# Patient Record
Sex: Female | Born: 1970 | Race: Black or African American | Hispanic: No | Marital: Single | Smoking: Never smoker
Health system: Southern US, Community
[De-identification: ages and names within clinical notes are randomized; demographics above are authoritative.]

## PROBLEM LIST (undated history)

## (undated) VITALS — BP 118/77 | HR 91 | Temp 97.9°F | Resp 20 | Ht 67.0 in | Wt 171.0 lb

## (undated) DIAGNOSIS — J302 Other seasonal allergic rhinitis: Secondary | ICD-10-CM

## (undated) DIAGNOSIS — D573 Sickle-cell trait: Secondary | ICD-10-CM

## (undated) DIAGNOSIS — F419 Anxiety disorder, unspecified: Secondary | ICD-10-CM

## (undated) DIAGNOSIS — I10 Essential (primary) hypertension: Secondary | ICD-10-CM

## (undated) DIAGNOSIS — F329 Major depressive disorder, single episode, unspecified: Secondary | ICD-10-CM

## (undated) HISTORY — DX: Sickle-cell trait: D57.3

## (undated) HISTORY — DX: Anxiety disorder, unspecified: F41.9

## (undated) HISTORY — DX: Major depressive disorder, single episode, unspecified: F32.9

## (undated) HISTORY — DX: Other seasonal allergic rhinitis: J30.2

## (undated) HISTORY — DX: Essential (primary) hypertension: I10

---

## 2007-03-22 ENCOUNTER — Inpatient Hospital Stay (HOSPITAL_COMMUNITY): Admission: AD | Admit: 2007-03-22 | Discharge: 2007-03-23 | Payer: Self-pay | Admitting: Obstetrics & Gynecology

## 2010-09-25 LAB — URINE MICROSCOPIC-ADD ON

## 2010-09-25 LAB — BASIC METABOLIC PANEL
BUN: 4 — ABNORMAL LOW
Calcium: 8.9
Creatinine, Ser: 0.51
GFR calc non Af Amer: >60
Glucose, Bld: 107 — ABNORMAL HIGH

## 2010-09-25 LAB — URINALYSIS, ROUTINE W REFLEX MICROSCOPIC
Glucose, UA: NEGATIVE
Ketones, ur: NEGATIVE
Leukocytes, UA: NEGATIVE
Protein, ur: NEGATIVE
Urobilinogen, UA: 0.2

## 2010-09-25 LAB — CBC
HCT: 32.9 — ABNORMAL LOW
Hemoglobin: 11.4 — ABNORMAL LOW
MCHC: 34.5
Platelets: 295
RDW: 14.6

## 2010-09-25 LAB — ETHANOL: Alcohol, Ethyl (B): <5

## 2010-09-25 LAB — RAPID URINE DRUG SCREEN, HOSP PERFORMED
Benzodiazepines: NOT DETECTED
Cocaine: NOT DETECTED
Opiates: NOT DETECTED

## 2012-12-17 ENCOUNTER — Ambulatory Visit: Payer: Self-pay | Admitting: Neurology

## 2012-12-18 ENCOUNTER — Encounter: Payer: Self-pay | Admitting: Neurology

## 2013-01-21 ENCOUNTER — Encounter: Payer: Self-pay | Admitting: Neurology

## 2013-01-21 ENCOUNTER — Ambulatory Visit (INDEPENDENT_AMBULATORY_CARE_PROVIDER_SITE_OTHER): Payer: Medicaid Other | Admitting: Neurology

## 2013-01-21 VITALS — BP 116/90 | HR 80 | Resp 18 | Ht 67.0 in | Wt 258.1 lb

## 2013-01-21 DIAGNOSIS — H02409 Unspecified ptosis of unspecified eyelid: Secondary | ICD-10-CM

## 2013-01-21 DIAGNOSIS — H02403 Unspecified ptosis of bilateral eyelids: Secondary | ICD-10-CM

## 2013-01-21 NOTE — Progress Notes (Signed)
[name] was seen today in neurologic consultation at the request of Dr. Claudean Kinds, her ophthalmologist at [facility].  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  She is unaccompanied in the office today.  The pt reports that she blinks her eyes a lot, especially when trying to open the eyes.  Sometimes she cannot get the eyes open and has to hold the lid open.  Its been going on a few years (2-3).  She sometimes has horizontal diplopia.  Sometimes one eye is closed and the other is open.  It does not seem to be worse at any particular time of day.  No problems with swallowing.  She has no SOB.  She is on abilfy for 6 months but these sx's predated the abilify.  She has no trouble climbing stairs.  She quit driving about 2.5 years ago because of this.  Just to climb the stairs, she has to hold the lids open to see.  No lateralizing weakness/paresthesias.  No voice changes.  From [place] at birth, then [place] and now [place].  Never seen another provider for this except the recent visit to Dr. Claudean Kinds.  Neuroimaging has not previously been performed.  I  PREVIOUS MEDICATIONS: Not applicable  ALLERGIES:  No Known Allergies  CURRENT MEDS   Medication List       This list is accurate as of: 01/21/13  9:54 AM.  Always use your most recent med list.               ARIPiprazole 9.75 MG/1.3ML injection  Commonly known as:  ABILIFY  Inject 9.75 mg into the muscle every 30 (thirty) days.     IRON PO  Take by mouth daily.     lisinopril 10 MG tablet  Commonly known as:  PRINIVIL,ZESTRIL  Take 10 mg by mouth daily.     VITAMIN D PO  Take by mouth daily.     ZOLOFT PO  Take by mouth daily.         PAST MEDICAL HISTORY:   Past Medical History  Diagnosis Date  . Hypertension   . Depression   . Anxiety     PAST SURGICAL HISTORY:   Past Surgical History  Procedure Laterality Date  . Cesarean section  2002, 2009    SOCIAL HISTORY:   History     Social History  . Marital Status: Married    Spouse Name: N/A    Number of Children: N/A  . Years of Education: N/A   Occupational History  . Not on file.   Social History Main Topics  . Smoking status: Never Smoker   . Smokeless tobacco: Not on file  . Alcohol Use: No  . Drug Use: No  . Sexual Activity: Not on file   Other Topics Concern  . Not on file   Social History Narrative  . No narrative on file    FAMILY HISTORY:   Family Status  Relation Status Death Age  . Mother Deceased     breast cancer  . Father Alive     healthy  . Sister Alive     healthy  . Sister Alive     healthy  . Brother Alive     healthy  . Brother Alive     healthy  . Child Alive     3, healthy    ROS:  A complete 10 system review of systems was obtained and was unremarkable apart from what is mentioned  above.  PHYSICAL EXAMINATION:    VITALS:   Filed Vitals:   01/21/13 0845  BP: 116/90  Pulse: 80  Resp: 18  Height: 5\' 7"  (1.702 m)  Weight: 258 lb 2 oz (117.085 kg)    GEN:  Normal appears female in no acute distress.  Appears stated age. HEENT:  Normocephalic, atraumatic. The mucous membranes are moist. The superficial temporal arteries are without ropiness or tenderness. Cardiovascular: Regular rate and rhythm. Lungs: Clear to auscultation bilaterally.  No conversational dyspnea Neck/Heme: There are no carotid bruits noted bilaterally.  NEUROLOGICAL: Orientation:  The patient is alert and oriented x 3.  Fund of knowledge is appropriate.  Recent and remote memory intact.  Attention span and concentration normal.  Repeats and names without difficulty. Cranial nerves: There is good facial symmetry. There is significant ptosis, L more than R, but with both eyes being virtually closed.  Ice test (modified as only put the ice on for 1 min) resulted in eyelids opening.  When manually opened, she does blink rapidly (not forcefully).  The pupils are equal round and reactive to light  bilaterally. Fundoscopic exam is attempted but the disc margins are not well visualized bilaterally. Extraocular muscles are intact but has some trouble maintaining prolonged upgaze and visual fields are full to confrontational testing (but has to hold lids open to participate with this). Speech is fluent and clear. Soft palate rises symmetrically and there is no tongue deviation. Hearing is intact to conversational tone. Tone: Tone is good throughout. Sensation: Sensation is intact to light touch and pinprick throughout (facial, trunk, extremities). Vibration is intact at the bilateral big toe. There is no extinction with double simultaneous stimulation. There is no sensory dermatomal level identified. Coordination:  The patient has no difficulty with RAM's or FNF bilaterally. Motor: Strength is 5/5 in the bilateral upper and lower extremities.  Shoulder shrug is equal and symmetric. There is no pronator drift.  There are no fasciculations noted. DTR's: Deep tendon reflexes are 1/4 at the bilateral biceps, triceps, brachioradialis, patella and achilles.  Plantar responses are downgoing bilaterally. Gait and Station: The patient is able to ambulate without difficulty. The patient is able to heel toe walk without any difficulty. The patient is able to ambulate in a tandem fashion. The patient is able to stand in the Romberg position.   IMPRESSION/PLAN  1. Ptosis.  -Myasthenia Gravis is high on the list of Ddx, given her hx of fluctuating ptosis and positive ice test.  -MG labs will be drawn today.  If negative, she will have MuSK and we can consider EMG testing.  -I asked her to call me in a week to get results of the labs and to see where we will go from there.  -face to face time, 60 min, mod complexity

## 2013-01-21 NOTE — Patient Instructions (Addendum)
1.  We will draw your labs today and I want you to call me in a week or so for the results.

## 2013-01-27 ENCOUNTER — Telehealth: Payer: Self-pay | Admitting: Neurology

## 2013-01-27 NOTE — Telephone Encounter (Signed)
Dr Tat did you call? I don't see her results yet.

## 2013-01-27 NOTE — Telephone Encounter (Signed)
Still awaiting her results

## 2013-01-27 NOTE — Telephone Encounter (Signed)
Patient made aware we are still awaiting results and we will contact her with them when they are received.

## 2013-01-27 NOTE — Telephone Encounter (Signed)
Pt called stating that Dr. Carles Collet requested for her to call and come in for her study results. She wants to know when she can come in.

## 2013-01-29 ENCOUNTER — Telehealth: Payer: Self-pay | Admitting: Neurology

## 2013-01-29 LAB — MYASTHENIA GRAVIS PANEL 2
ACETYLCHOLINE REC MOD AB: 12 %
Acetylcholine Rec Binding: <0.3 nmol/L

## 2013-01-29 NOTE — Telephone Encounter (Signed)
Message copied by Annamaria Helling on Thu Jan 29, 2013  3:35 PM ------      Message from: TAT, Moca S      Created: Thu Jan 29, 2013  1:58 PM       She needs MuSK drawn and rep stim EMG ------

## 2013-02-02 NOTE — Telephone Encounter (Signed)
Tried to call patient with no answer and no way to leave message. Will try again later.

## 2013-02-03 NOTE — Telephone Encounter (Signed)
Called and they stated I have the wrong number. Letter mailed to patient to contact us for results and to update her contact information.

## 2013-02-09 ENCOUNTER — Telehealth: Payer: Self-pay | Admitting: Neurology

## 2013-02-09 NOTE — Telephone Encounter (Signed)
Called patient and made her aware lab work normal. We need to order an [employer] test (MuSK) that she must sign for. She will come by the office some time this week and sign the [place] form. She will ask for me. I will have our front desk call and set her up for an EMG of rep stim.

## 2013-02-09 NOTE — Telephone Encounter (Signed)
Pt called stating that she received a letter where we have tried to contact her regarding lab results. Please call the pt back.

## 2013-02-09 NOTE — Telephone Encounter (Signed)
[name] can you please set up patient for Rep Stim EMG.

## 2013-03-03 NOTE — Telephone Encounter (Signed)
Patient just came by today to sign the [employer] form. Faxed to [facility] and they will contact patient.

## 2013-03-24 ENCOUNTER — Telehealth: Payer: Self-pay | Admitting: Neurology

## 2013-03-24 NOTE — Telephone Encounter (Signed)
Pt called requesting to speak to a nurse regarding her last blood work results.

## 2013-03-24 NOTE — Telephone Encounter (Signed)
Spoke with patient and made her aware I spoke with [facility]ley Noon last week and her lab results are not due to be resulted until 03/31/2013. She is aware I will call her with results once I get them.

## 2013-03-27 ENCOUNTER — Telehealth: Payer: Self-pay | Admitting: Neurology

## 2013-03-27 NOTE — Telephone Encounter (Signed)
Pt needs to talk to someone about blood work please call [phone]

## 2013-03-27 NOTE — Telephone Encounter (Signed)
Spoke with patient - she states [facility] Noon is trying to charge her $250 for the blood test she had drawn. She was upset and wanted me to call them to explain to them that she couldn't pay it. I told her that I could not do that. It does state that [facility] Noon has a financial assistance program to help those on medicaid, I encouraged her to talk with them directly about this.

## 2013-04-02 ENCOUNTER — Telehealth: Payer: Self-pay | Admitting: Neurology

## 2013-04-02 NOTE — Telephone Encounter (Signed)
Please call patient and let her know that her lab was normal.  Please schedule c/s with Dr. Posey Pronto

## 2013-04-02 NOTE — Telephone Encounter (Signed)
Tried to call patient but number is unavailable at this time.

## 2013-04-02 NOTE — Telephone Encounter (Signed)
Just send her a certified letter in the mail then to call us.

## 2013-04-02 NOTE — Telephone Encounter (Signed)
Tried to call again and phone still not working. Send a certified letter to patient to contact our office.

## 2013-04-13 ENCOUNTER — Telehealth: Payer: Self-pay | Admitting: Neurology

## 2013-04-13 DIAGNOSIS — H02409 Unspecified ptosis of unspecified eyelid: Secondary | ICD-10-CM

## 2013-04-13 NOTE — Telephone Encounter (Signed)
Pt called/returning Jade's call. Best number to reach her is [phone]

## 2013-04-13 NOTE — Telephone Encounter (Signed)
Spoke with patient and made her aware that [facility] lab is normal. Next step would be an EMG. Dr Posey Pronto- please review records and let us know how to schedule. Thanks.

## 2013-04-13 NOTE — Telephone Encounter (Signed)
90-min EMG (MG protocol).  Deen Deguia K. Posey Pronto, DO

## 2013-04-24 ENCOUNTER — Encounter: Payer: Self-pay | Admitting: Neurology

## 2013-06-25 ENCOUNTER — Ambulatory Visit (INDEPENDENT_AMBULATORY_CARE_PROVIDER_SITE_OTHER): Payer: Medicaid Other | Admitting: Neurology

## 2013-06-25 DIAGNOSIS — H02409 Unspecified ptosis of unspecified eyelid: Secondary | ICD-10-CM

## 2013-06-25 DIAGNOSIS — H02403 Unspecified ptosis of bilateral eyelids: Secondary | ICD-10-CM

## 2013-06-25 DIAGNOSIS — H532 Diplopia: Secondary | ICD-10-CM

## 2013-06-25 DIAGNOSIS — R531 Weakness: Secondary | ICD-10-CM

## 2013-06-25 NOTE — Procedures (Signed)
[facility]  [address]  [place], [place] [address] Tel: [phone] Fax:  [phone] Test Date:  06/25/2013  Patient: [name] DOB: Jan 11, 1970 Physician: Narda Amber, DO  Sex: Female Height: 5\' 7"  Ref Phys: Alonza Bogus  ID#: [id number]   Technician: Laureen Ochs R. NCS T.   Patient Complaints: This is a 43 year old female presenting for evaluation of horizontal diplopia, ptosis, and blepharospasm.  NCV & EMG Findings: Extensive electrodiagnostic testing of the left upper and lower extremity with repetitive nerve stimulation of the median and facial nerves reveals:  1. Normal median and sural sensory responses.  2. Normal median motor and peroneal motor response recording at the tibialis anterior. There is no motor response when stimulating the peroneal nerve at the ankle and recording at the extensor digitorum brevis, however, the motor response is normal when stimulating at the fibular head and popliteal fossa. Stimulation at the lateral malleolus produced no motor response. These findings are of uncertain clinical significance.  3. Repetitive nerve stimulation of the median nerve recording at abductor pollicis brevis and the facial nerve recording at the nasalis showed no incremental or decremental changes.  4. No active or chronic motor axon loss changes are seen in any of the tested muscles.   Impression: There is no evidence of a neuromuscular junction disorder. If clinically indicated, single fiber EMG can be considered.  Abnormal peroneal motor response recording at the extensor digitorum brevis is of unclear clinical significance. Patient may return for a dedicated study of the lower extremities, if needed.   ___________________________ Narda Amber, DO    Nerve Conduction Studies Anti Sensory Summary Table   Site NR Peak (ms) Norm Peak (ms) P-T Amp (V) Norm P-T Amp  Left Median Anti Sensory (2nd Digit)  32C  Wrist    2.9 <3.4  51.6 >20  Left Sural Anti Sensory (Lat Mall)  31C  Calf    3.6 <4.5 11.6 >5   Motor Summary Table   Site NR Onset (ms) Norm Onset (ms) O-P Amp (mV) Norm O-P Amp Site1 Site2 Delta-0 (ms) Dist (cm) Vel (m/s) Norm Vel (m/s)  Left Median Motor (Abd Poll Brev)  32C  Wrist    2.7 <3.9 11.4 >6 Elbow Wrist 4.1 24.5 60 >50  Elbow    6.8  11.1         Left Peroneal Motor (Ext Dig Brev)  31C  Ankle    13.8 <5.5 0.0 >3 B Fib Ankle 3.6 0.0  >40  B Fib    10.2  6.4  Poplt B Fib 1.3 10.0 77 >40  Poplt    11.5  6.2         Lateral malleolus    13.8  0.0         Left Peroneal TA Motor (Tib Ant)  31C  Fib Head    2.5 <4.0 4.7 >4 Poplit Fib Head 1.7 7.5 44 >40  Poplit    4.2  4.7          EMG   Side Muscle Ins Act Fibs Psw Fasc Number Recrt Dur Dur. Amp Amp. Poly Poly. Comment  Left AntTibialis Nml Nml Nml Nml Nml Nml Nml Nml Nml Nml Nml Nml N/A  Left 1stDorInt Nml Nml Nml Nml Nml Nml Nml Nml Nml Nml Nml Nml N/A  Left FlexPolLong Nml Nml Nml Nml Nml Nml Nml Nml Nml Nml Nml Nml N/A  Left BrachioRad Nml Nml Nml Nml Nml Nml Nml Nml  Nml Nml Nml Nml N/A  Left PronatorTeres Nml Nml Nml Nml Nml Nml Nml Nml Nml Nml Nml Nml N/A  Left Biceps Nml Nml Nml Nml Nml Nml Nml Nml Nml Nml Nml Nml N/A  Left Triceps Nml Nml Nml Nml Nml Nml Nml Nml Nml Nml Nml Nml N/A  Left RectFemoris Nml Nml Nml Nml Nml Nml Nml Nml Nml Nml Nml Nml N/A  Left GluteusMed Nml Nml Nml Nml Nml Nml Nml Nml Nml Nml Nml Nml N/A  Left Gastroc Nml Nml Nml Nml Nml Nml Nml Nml Nml Nml Nml Nml N/A  Left Flex Dig Long Nml Nml Nml Nml Nml Nml Nml Nml Nml Nml Nml Nml N/A   RNS   Trial # Label Amp 1 (mV)  O-P Amp 5 (mV)  O-P Amp % Dif Area 1 (mVms) Area 5 (mVms) Area % Dif Rep Rate Train Length Pause Time (min:sec) Comments  Left Abd Poll Brev  Tr 1 Baseline 9.76 9.73 -0.3 24.86 23.50 -5.5 3.00 10 00:30   Tr 2 Post Exercise 9.86 9.83 -0.3 23.67 22.76 -3.8 3.00 10 01:00   Tr 3 1 Min Post 10.55 10.30 -2.4 26.60 24.74 -7.0 3.00 10 01:00   Tr 4  2 Min Post 10.39 10.11 -2.7 26.41 24.38 -7.7 3.00 10 01:00   Tr 5 3 Min Post 10.11 9.98 -1.3 25.77 24.65 -4.3 3.00 10 00:00   Left Nasalis  Tr 1 Baseline 2.94 2.95 0.3 0.00 0.00 0.0 3.00 10 00:30   Tr 2 Post Exercise 3.50 3.44 -1.5 0.00 0.00 0.0 3.00 10 01:00   Tr 3 1 Min Post 3.46 3.47 0.4 0.00 0.00 0.0 3.00 10 01:00   Tr 4 2 Min Post 0.66 0.71 8.0 0.75 0.78 4.1 3.00 10 01:00   Tr 5 3 Min Post 3.10 3.35 7.9 0.00 0.00 0.0 3.00 10 00:00     Waveforms:

## 2013-06-27 ENCOUNTER — Telehealth: Payer: Self-pay | Admitting: Neurology

## 2013-06-27 NOTE — Telephone Encounter (Signed)
[name], please let pt know that thus far her labs and EMG are normal.  If still having the same problem, I would like a consult with Dr. Posey Pronto, if Dr. Posey Pronto agreeable, especially since we haven't seen pt in office for visit (except testing) since January.

## 2013-06-29 NOTE — Telephone Encounter (Signed)
No problem.    Vanessa K. Posey Pronto, DO

## 2013-06-29 NOTE — Telephone Encounter (Signed)
Unable to contact patient by phone. A letter has been sent to patient.

## 2013-06-29 NOTE — Telephone Encounter (Signed)
Tried to call patient and phone is not working. Will try again later.

## 2013-07-10 ENCOUNTER — Telehealth: Payer: Self-pay | Admitting: Neurology

## 2013-07-10 NOTE — Telephone Encounter (Signed)
Spoke with patient and she is still having same troubles with eyes. She received our letter, due to not being able to reach her by phone. She was to make a new patient appt with Dr Posey Pronto. Please call and schedule a new patient evaluation with Dr Posey Pronto. See previous note.

## 2013-07-10 NOTE — Telephone Encounter (Signed)
Pt requests return call regarding eye problems. CB# [phone] / Sherri S.

## 2013-08-20 ENCOUNTER — Encounter: Payer: Self-pay | Admitting: Neurology

## 2013-08-20 ENCOUNTER — Ambulatory Visit (INDEPENDENT_AMBULATORY_CARE_PROVIDER_SITE_OTHER): Payer: Medicaid Other | Admitting: Neurology

## 2013-08-20 VITALS — BP 134/90 | HR 65 | Ht 67.0 in | Wt 260.1 lb

## 2013-08-20 DIAGNOSIS — H02409 Unspecified ptosis of unspecified eyelid: Secondary | ICD-10-CM

## 2013-08-20 DIAGNOSIS — H02403 Unspecified ptosis of bilateral eyelids: Secondary | ICD-10-CM

## 2013-08-20 LAB — C-REACTIVE PROTEIN: CRP: 0.9 mg/dL — AB (ref <0.60)

## 2013-08-20 LAB — VITAMIN B12: Vitamin B-12: 863 pg/mL (ref 211–911)

## 2013-08-20 LAB — SEDIMENTATION RATE: SED RATE: 47 mm/h — AB (ref 0–22)

## 2013-08-20 LAB — TSH: TSH: 2.43 u[IU]/mL (ref 0.350–4.500)

## 2013-08-20 MED ORDER — PYRIDOSTIGMINE BROMIDE 60 MG PO TABS: 60.0000 mg | ORAL_TABLET | Freq: Three times a day (TID) | ORAL | Status: DC

## 2013-08-20 NOTE — Patient Instructions (Addendum)
1.  Check blood work 2.  Start mestinon 60mg  three times daily.  Please pay attention if this medication helps your eyes open more.   3.  Return to clinic in 8 weeks

## 2013-08-20 NOTE — Progress Notes (Signed)
[facility] Neurology Division Clinic Note - Initial Visit   Date: 08/20/2013  Vanessa [name] MRN: [id number] DOB: Dec 26, 1970   Dear Dr. Arbutus Leas:  Thank you for your kind referral of Vanessa [name] for consultation of [name]. Although her history is well known to you, please allow [place] to reiterate it for the purpose of our medical record. The patient was accompanied to the clinic by self.    History of Present Illness: Vanessa [name] is a 43 y.o. right-handed originally from [place] with history of [name], Vanessa [name].  Starting around 2012, she developed excessive blinking of the eyes, which interfered with her driving so stopped driving.  She reports having intermittent blurry vision, described as "water over the eyes".  She does not have any double vision.  She also complains of droopiness of her eyelids, which alternates eyes.  She reports the sun makes her symptoms worse, but does not have similar symptoms with bright ambient light.  She sometimes cannot open her eyes Vanessa has to manually hold it open.  Fatigue or overexertion does not change her symptoms.  There is no diurnal variation.  When symptoms started, she was living in [place] at the time Vanessa under the care of a psychiatrist for depression.  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    She denies limb weakness, difficulty swallowing/talking, or shortness of breath.  She was initially referred to my colleague, Dr. Arbutus Leas, for evaluation in January 2015.  At that time, she had testing for AChR antibodies Vanessa MUSK antibody testing which was normal.  EMG with repetitive nerve stimulation performed by myself was normal.   No family history of muscle disease.    Out-side paper records, electronic medical record, Vanessa images have been reviewed where available Vanessa summarized as:  MUSK antibody 03/16/2013:   Negative AChR antibody binding, blocking, Vanessa modulating 01/21/2013:  negative  EMG 06/25/2013: There is no evidence of a neuromuscular junction disorder. If clinically indicated, single fiber EMG can be considered.  Abnormal peroneal motor response recording at the extensor digitorum brevis is of unclear clinical significance. Patient may return for a dedicated study of the lower extremities, if needed.    Past Medical History  Diagnosis Date  . [name]   . Depression   . Anxiety     Past Surgical History  Procedure Laterality Date  . Cesarean section  2002, 2009     Medications:  Current Outpatient Prescriptions on File Prior to Visit  Medication Sig Dispense Refill  . ARIPiprazole (ABILIFY) 9.75 MG/1.3ML injection Inject 9.75 mg into the muscle every 30 (thirty) days.      . Cholecalciferol (VITAMIN D PO) Take by mouth daily.      Marland Kitchen lisinopril (PRINIVIL,ZESTRIL) 10 MG tablet Take 10 mg by mouth daily.      . Sertraline HCl (ZOLOFT PO) Take by mouth daily.       No current facility-administered medications on file prior to visit.    Allergies: No Known Allergies  Family History: Family History  Problem Relation Age of Onset  . Breast cancer Mother     Deceased, 80  . [name] Mother   . Arthritis Father   . [name] Father   . Healthy Son   . Asthma Daughter   . Diabetes type II Daughter     Social History: History   Social History  . Marital Status: Married    Spouse  Name: N/A    Number of Children: N/A  . Years of Education: N/A   Occupational History  . Not on file.   Social History Main Topics  . Smoking status: Never Smoker   . Smokeless tobacco: Not on file  . Alcohol Use: No  . Drug Use: No  . Sexual Activity: Not on file   Other Topics Concern  . Not on file   Social History Narrative   She lives at home with 2 sons (31, 10) Vanessa 1 daughter (15).   She is currently not working.  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  She moved from [place] in 2013.    Review of Systems:  CONSTITUTIONAL: No fevers, chills, night sweats, or weight loss.   EYES: No visual changes or eye pain ENT: No hearing changes.  No history of nose bleeds.   RESPIRATORY: No cough, wheezing Vanessa shortness of breath.   CARDIOVASCULAR: Negative for chest pain, Vanessa palpitations.   GI: Negative for abdominal discomfort, blood in stools or black stools.  No recent change in bowel habits.   GU:  No history of incontinence.   MUSCLOSKELETAL: No history of joint pain or swelling.  No myalgias.   SKIN: Negative for lesions, rash, Vanessa itching.   HEMATOLOGY/ONCOLOGY: Negative for prolonged bleeding, bruising easily, Vanessa swollen nodes.  ENDOCRINE: Negative for cold or heat intolerance, polydipsia or goiter.   PSYCH:  ++depression or anxiety symptoms.   NEURO: As Above.   Vital Signs:  BP 134/90  Pulse 65  Ht $R'5\' 7"'UL$  (1.702 m)  Wt 260 lb 2 oz (117.992 kg)  BMI 40.73 kg/m2  SpO2 98%  Neurological Exam: MENTAL STATUS including orientation to time, place, person, recent Vanessa remote memory, attention span Vanessa concentration, language, Vanessa fund of knowledge is normal.  Speech is not dysarthric.  CRANIAL NERVES: II:  No visual field defects.  Unremarkable fundi.   III-IV-VI: Pupils equal round Vanessa reactive to light.  Normal conjugate, extra-ocular eye movements in all directions of gaze.  No nystagmus.  Moderate bilateral [name] with frequent blinking which is distractible.  Ice pack test was mildly positive.  V:  Normal facial sensation.  Jaw jerk is absent.   VII:  Normal facial symmetry Vanessa movements.  Orbicularis oris, orbicularis oculi, Vanessa buccinator is 5/5. No pathologic facial reflexes.  VIII:  Normal hearing Vanessa vestibular function.   IX-X:  Normal palatal movement.   XI:  Normal shoulder shrug Vanessa head rotation.   XII:  Normal tongue strength Vanessa range of motion, no deviation or  fasciculation.  MOTOR:  No atrophy, fasciculations or abnormal movements.  No pronator drift.  Tone is normal.    Right Upper Extremity:    Left Upper Extremity:    Deltoid  5/5   Deltoid  5/5   Biceps  5/5   Biceps  5/5   Triceps  5/5   Triceps  5/5   Wrist extensors  5/5   Wrist extensors  5/5   Wrist flexors  5/5   Wrist flexors  5/5   Finger extensors  5/5   Finger extensors  5/5   Finger flexors  5/5   Finger flexors  5/5   Dorsal interossei  5/5   Dorsal interossei  5/5   Abductor pollicis  5/5   Abductor pollicis  5/5   Tone (Ashworth scale)  0  Tone (Ashworth scale)  0   Right Lower  Extremity:    Left Lower Extremity:    Hip flexors  5/5   Hip flexors  5/5   Hip extensors  5/5   Hip extensors  5/5   Knee flexors  5/5   Knee flexors  5/5   Knee extensors  5/5   Knee extensors  5/5   Dorsiflexors  5/5   Dorsiflexors  5/5   Plantarflexors  5/5   Plantarflexors  5/5   Toe extensors  5/5   Toe extensors  5/5   Toe flexors  5/5   Toe flexors  5/5   Tone (Ashworth scale)  0  Tone (Ashworth scale)  0   MSRs:  Right                                                                 Left brachioradialis 2+  brachioradialis 2+  biceps 2+  biceps 2+  triceps 2+  triceps 2+  patellar 2+  patellar 2+  ankle jerk 2+  ankle jerk 2+  Hoffman no  Hoffman no  plantar response down  plantar response down   SENSORY:  Normal Vanessa symmetric perception of light touch, pinprick, vibration, Vanessa proprioception.  Romberg's sign absent.   COORDINATION/GAIT: Normal finger-to- nose-finger Vanessa heel-to-shin.  Intact rapid alternating movements bilaterally.  Able to rise from a chair without using arms.  Gait narrow based Vanessa stable. Tandem Vanessa stressed gait intact.    IMPRESSION: Ms. Yearwood is a 43 year-old female with history of depression/anxiety presenting for evaluation of bilateral [name].  She had had AChR binding/blocking/modulating Vanessa MUSK antibody testing which is negative.  Repetitive  nerve stimulation was also normal.  Her exam shows bilateral [name], which fluctuates during the exam Vanessa there is frequent blinking of the eyes, which is very atypical for neuromuscular junction disorders.  There is no fatigable weakness on exam Vanessa facial muscles show full strength.  Her [name] Vanessa eye blinking is quite variable on exam Vanessa distractible (?nonphysiological). Given that her icepack test is mildly positive Vanessa despite her negative antibody Vanessa RNS, I think it is prudent to offer a trial of mestinon. If there is no improvement with mestinon, will wean her off it Vanessa may seek opinion at an academic center.   She is very tearful at the lack of diagnosis, despite seeing various providers in the past.    PLAN/RECOMMENDATIONS:  1.  Check TSH, ESR, CRP, vitamin B12 2.  Trial of mestinon $RemoveBef'60mg'NAsLcPFeix$  three times daily.  Risks Vanessa benefits discussed.   3.  Return to clinic in 2 months.   The duration of this appointment visit was 50 minutes of face-to-face time with the patient.  Greater than 50% of this time was spent in counseling, explanation of diagnosis, planning of further management, Vanessa coordination of care.   Thank you for allowing me to participate in patient's care.  If I can answer any additional questions, I would be pleased to do so.    Sincerely,    Nicholos Aloisi K. Posey Pronto, DO

## 2013-09-28 ENCOUNTER — Telehealth: Payer: Self-pay | Admitting: Neurology

## 2013-09-28 NOTE — Telephone Encounter (Signed)
Records release form received from [facility]. Forwarded to HIM at [employer] via [employer] / [facility]

## 2013-10-20 ENCOUNTER — Encounter: Payer: Self-pay | Admitting: Neurology

## 2013-10-20 ENCOUNTER — Ambulatory Visit (INDEPENDENT_AMBULATORY_CARE_PROVIDER_SITE_OTHER): Payer: Medicaid Other | Admitting: Neurology

## 2013-10-20 VITALS — BP 140/86 | HR 69 | Ht 67.0 in | Wt 257.4 lb

## 2013-10-20 DIAGNOSIS — Z79899 Other long term (current) drug therapy: Secondary | ICD-10-CM

## 2013-10-20 DIAGNOSIS — H02403 Unspecified ptosis of bilateral eyelids: Secondary | ICD-10-CM

## 2013-10-20 LAB — C-REACTIVE PROTEIN: CRP: 0.5 mg/dL (ref <0.60)

## 2013-10-20 LAB — CK: Total CK: 80 U/L (ref 7–177)

## 2013-10-20 LAB — SEDIMENTATION RATE: Sed Rate: 44 mm/hr — ABNORMAL HIGH (ref 0–22)

## 2013-10-20 NOTE — Progress Notes (Signed)
Follow-up Visit   Date: 10/20/2013    [name] MRN: [id number] DOB: Dec 02, 1970   Interim History: [name] is a 43 y.o. right-handed originally from [place] with history of hypertension, and depression returning to clinic for follow-up of bilateral ptosis.  The patient was accompanied to the clinic by self.   History of present illness: Starting around 2012, she developed excessive blinking of the eyes, which interfered with her driving so stopped driving. She reports having intermittent blurry vision, described as "water over the eyes". She does not have any double vision. She also complains of droopiness of her eyelids, which alternates eyes. She reports the sun makes her symptoms worse, but does not have similar symptoms with bright ambient light. She sometimes cannot open her eyes and has to manually hold it open. Fatigue or overexertion does not change her symptoms. There is no diurnal variation. When symptoms started, she was living in [place] at the time and under the care of a psychiatrist for depression. 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   She denies limb weakness, difficulty swallowing/talking, or shortness of breath. She was initially referred to my colleague, Dr. Carles Hall, for evaluation in January 2015. At that time, she had testing for AChR antibodies and MUSK antibody testing which was normal. EMG with repetitive nerve stimulation performed by myself was normal.  No family history of muscle disease.  UPDATE 10/20/2013:  She was given a trial of mestinon $RemoveBef'60mg'qgsIVnIasl$  TID at her last visit, but this has made no change in her ptosis.  She did have eye evaluation and has been prescribed corrective lenses.  She also reports being referred to Dr. 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   Medications:  Current Outpatient Prescriptions on File Prior to Visit  Medication Sig Dispense Refill  .  ARIPiprazole (ABILIFY) 9.75 MG/1.3ML injection Inject 9.75 mg into the muscle every 30 (thirty) days.      . Cholecalciferol (VITAMIN D PO) Take by mouth daily.      Marland Kitchen lisinopril (PRINIVIL,ZESTRIL) 10 MG tablet Take 10 mg by mouth daily.      Marland Kitchen pyridostigmine (MESTINON) 60 MG tablet Take 1 tablet (60 mg total) by mouth 3 (three) times daily.  90 tablet  3  . Sertraline HCl (ZOLOFT PO) Take by mouth daily.       No current facility-administered medications on file prior to visit.    Allergies: No Known Allergies   Review of Systems:  CONSTITUTIONAL: No fevers, chills, night sweats, or weight loss.   EYES: No visual changes or eye pain ENT: No hearing changes.  No history of nose bleeds.   RESPIRATORY: No cough, wheezing and shortness of breath.   CARDIOVASCULAR: Negative for chest pain, and palpitations.   GI: Negative for abdominal discomfort, blood in stools or black stools.  No recent change in bowel habits.   GU:  No history of incontinence.   MUSCLOSKELETAL: No history of joint pain or swelling.  No myalgias.   SKIN: Negative for lesions, rash, and itching.   ENDOCRINE: Negative for cold or heat intolerance, polydipsia or goiter.   PSYCH:  + depression or anxiety symptoms.   NEURO: As Above.   Vital Signs:  BP 140/86  Pulse 69  Ht $R'5\' 7"'df$  (1.702 m)  Wt 257 lb 6 oz (116.745 kg)  BMI 40.30 kg/m2  SpO2 97%  Neurological Exam: MENTAL STATUS including orientation to time, place, person, recent  and remote memory, attention span and concentration, language, and fund of knowledge is normal.  Speech is not dysarthric.  CRANIAL NERVES:  No visual field defects.  Pupils equal round and reactive to light.  Normal conjugate, extra-ocular eye movements in all directions of gaze.  Moderate bilateral ptosis(left >right) with frequent blinking which is distractible.  Facial muscles 5/5 including orbicularis oris, orbicularis oculi, and buccinator.  Palate elevates symmetrically.  Tongue is  midline.  MOTOR:  Motor strength is 5/5 in all extremities.  No atrophy, fasciculations or abnormal movements.  No pronator drift.  Tone is normal.    MSRs:  Reflexes are 2+/4 throughout.  SENSORY:  Intact to vibration.  COORDINATION/GAIT:   Gait narrow based and stable.   Data: Component     Latest Ref Rng 08/20/2013  TSH     0.350 - 4.500 uIU/mL 2.430  Sed Rate     0 - 22 mm/hr 47 (H)  CRP     <0.60 mg/dL 0.9 (H)  Vitamin B-12     211 - 911 pg/mL 863   MUSK antibody 03/16/2013: Negative   AChR antibody binding, blocking, and modulating 01/21/2013: negative   EMG 06/25/2013:  There is no evidence of a neuromuscular junction disorder. If clinically indicated, single fiber EMG can be considered.  Abnormal peroneal motor response recording at the extensor digitorum brevis is of unclear clinical significance. Patient may return for a dedicated study of the lower extremities, if needed.   IMPRESSION: [name] is a 43 year-old female with history of depression/anxiety returning for evaluation of bilateral ptosis. She had had AChR binding/blocking/modulating and MUSK antibody testing which is negative. Repetitive nerve stimulation was also normal. Her exam shows bilateral ptosis, which fluctuates during the exam and there is frequent blinking of the eyes, which is very atypical for neuromuscular junction disorders. There is no fatigable weakness on exam and facial muscles show full strength. Her ptosis and eye blinking is quite variable on exam and distractible (?nonphysiological).  I question if there is component of blepharospasm given her negative work-up.   Trial of mestinon did not have any noticeable benefit, so will stop it.   There was mild elevation in inflammatory markers at her last visit, so I will recheck and add a few additional labs for myopathy although my suspicion is low given her normal motor strength.   PLAN/RECOMMENDATIONS:  1.  Stop mestinon 2.  Check ESR,  CRP, ANA, ENA, CK, aldolase 3.  She will be seeing Dr. 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  Patient to call me in 2-3 months or after seeing eye doctor with an update 4.  If no improvement, may need to get opinion at a [facility] 5.  Return to clinic as needed   The duration of this appointment visit was 25 minutes of face-to-face time with the patient.  Greater than 50% of this time was spent in counseling, explanation of diagnosis, planning of further management, and coordination of care.   Thank you for allowing me to participate in patient's care.  If I can answer any additional questions, I would be pleased to do so.    Sincerely,    Stuti Sandin K. Posey Pronto, DO

## 2013-10-20 NOTE — Patient Instructions (Addendum)
1.  Stop mestinon 2.  Check blood work today 3.  Please call me in 2-3 months or after you have seen your eye doctor with an update 4.  If no improvement, may need to get opinion at a university hospital 5.  Return to clinic as needed

## 2013-10-21 LAB — ENA 9 PANEL
Centromere Ab Screen: <1
ENA SM AB SER-ACNC: NEGATIVE
Jo-1 Antibody, IgG: <1
Ribosomal P Protein Ab: <1
SM/RNP: POSITIVE — AB
SSA (RO) (ENA) ANTIBODY, IGG: NEGATIVE
SSB (LA) (ENA) ANTIBODY, IGG: NEGATIVE
Scleroderma (Scl-70) (ENA) Antibody, IgG: <1
ds DNA Ab: <1 IU/mL

## 2013-10-21 LAB — ANA: Anti Nuclear Antibody(ANA): NEGATIVE

## 2013-10-22 LAB — ALDOLASE: Aldolase: 5 U/L (ref <=8.1)

## 2013-10-23 ENCOUNTER — Encounter: Payer: Self-pay | Admitting: *Deleted

## 2013-10-23 ENCOUNTER — Telehealth: Payer: Self-pay | Admitting: *Deleted

## 2013-10-23 NOTE — Telephone Encounter (Signed)
Patient called back and I informed her that her labs were within normal limits.

## 2013-11-02 ENCOUNTER — Telehealth: Payer: Self-pay | Admitting: Neurology

## 2013-11-02 NOTE — Telephone Encounter (Signed)
Pt called wanting to let Dr. Posey Pronto know that she saw Dr. Donalda Ewings on 10/27/13. Pt stated that Dr. Donalda Ewings is going to refer her to another eyer doctor.  C/B [phone]

## 2013-11-03 NOTE — Telephone Encounter (Signed)
FYI

## 2013-11-03 NOTE — Telephone Encounter (Signed)
Noted.  Donika K. Patel, DO   

## 2014-06-14 DIAGNOSIS — G245 Blepharospasm: Secondary | ICD-10-CM | POA: Insufficient documentation

## 2016-04-03 ENCOUNTER — Ambulatory Visit (INDEPENDENT_AMBULATORY_CARE_PROVIDER_SITE_OTHER): Payer: Self-pay | Admitting: *Deleted

## 2016-04-03 DIAGNOSIS — Z3202 Encounter for pregnancy test, result negative: Secondary | ICD-10-CM

## 2016-04-03 DIAGNOSIS — Z3200 Encounter for pregnancy test, result unknown: Secondary | ICD-10-CM

## 2016-04-03 LAB — POCT PREGNANCY, URINE: Preg Test, Ur: NEGATIVE

## 2016-04-03 NOTE — Progress Notes (Signed)
Pt informed of negative urine pregnancy test. She states LMP was in February but she doesn't know the exact day. Pt is not currently having sx of pregnancy. She was advised by me to wait one week and if no period, she should take a home UPT or schedule to have repeated at this office.  Pt voiced understanding.

## 2016-04-08 ENCOUNTER — Ambulatory Visit (HOSPITAL_COMMUNITY)
Admission: EM | Admit: 2016-04-08 | Discharge: 2016-04-08 | Disposition: A | Discharge status: Home / Self Care | Payer: Medicaid Other | Attending: Emergency Medicine | Admitting: Emergency Medicine

## 2016-04-08 ENCOUNTER — Encounter (HOSPITAL_COMMUNITY): Payer: Self-pay | Admitting: Emergency Medicine

## 2016-04-08 DIAGNOSIS — N939 Abnormal uterine and vaginal bleeding, unspecified: Secondary | ICD-10-CM

## 2016-04-08 DIAGNOSIS — Z3202 Encounter for pregnancy test, result negative: Secondary | ICD-10-CM

## 2016-04-08 LAB — POCT PREGNANCY, URINE: PREG TEST UR: NEGATIVE

## 2016-04-08 NOTE — ED Provider Notes (Signed)
CSN: [id number]     Arrival date & time 04/08/16  1201 History   First MD Initiated Contact with Patient 04/08/16 1237     Chief Complaint  Patient presents with  . Vaginal Bleeding   (Consider location/radiation/quality/duration/timing/severity/associated sxs/prior Treatment) 46 year old female presents to clinic for evaluation of irregular menses. She states she had gone 1 month and three weeks without a cycle, then she started her period today. She describes her bleeding as lite, has only used one pad today. She was evaluated at another clinic last week and tested negative for pregnancy. She denies any abdominal pain, abdominal cramping, weakness, dizziness, light headedness, nausea, vomiting, fever, vaginal pain or discharge. She has been experiencing night sweats, hot flashes, and chills. She does not know what age her mother had menopause. She has had three pregnancies, three living children, one vaginal birth, 2 caesarian, all were full term without complications.    The history is provided by the patient.  Vaginal Bleeding  Associated symptoms: no abdominal pain, no back pain, no dizziness, no dyspareunia, no dysuria, no fever, no nausea and no vaginal discharge     Past Medical History:  Diagnosis Date  . Anxiety   . Depression   . Hypertension    Past Surgical History:  Procedure Laterality Date  . CESAREAN SECTION  2002, 2009   Family History  Problem Relation Age of Onset  . Breast cancer Mother     Deceased, 68  . Hypertension Mother   . Arthritis Father   . Hypertension Father   . Healthy Son   . Asthma Daughter   . Diabetes type II Daughter    Social History  Substance Use Topics  . Smoking status: Never Smoker  . Smokeless tobacco: Not on file  . Alcohol use No   OB History    No data available     Review of Systems  Constitutional: Positive for chills. Negative for activity change, appetite change and fever.       Hot flashes  HENT: Negative for sinus  pain and sinus pressure.   Respiratory: Negative for cough, shortness of breath and wheezing.   Cardiovascular: Negative for chest pain, palpitations and leg swelling.  Gastrointestinal: Negative for abdominal pain, constipation, nausea and vomiting.  Genitourinary: Positive for menstrual problem and vaginal bleeding. Negative for difficulty urinating, dyspareunia, dysuria, flank pain, frequency, pelvic pain, vaginal discharge and vaginal pain.  Musculoskeletal: Negative for back pain, myalgias, neck pain and neck stiffness.  Skin: Negative.   Neurological: Negative for dizziness, weakness and light-headedness.  Hematological: Negative for adenopathy. Does not bruise/bleed easily.    Allergies  Patient has no known allergies.  Home Medications   Prior to Admission medications   Not on File   Meds Ordered and Administered this Visit  Medications - No data to display  BP (!) 151/87 (BP Location: Right Arm)   Pulse (!) 58   Temp 97.9 F (36.6 C) (Oral)   Resp 16   LMP 04/08/2016   SpO2 100%  No data found.   Physical Exam  Constitutional: She is oriented to person, place, and time. She appears well-developed and well-nourished. No distress.  HENT:  Head: Normocephalic.  Right Ear: External ear normal.  Left Ear: External ear normal.  Eyes: Conjunctivae are normal. Right eye exhibits no discharge. Left eye exhibits no discharge.  Cardiovascular: Normal rate and regular rhythm.   Pulmonary/Chest: Effort normal and breath sounds normal.  Abdominal: Soft. Bowel sounds are normal.  Neurological: She is alert and oriented to person, place, and time.  Skin: Skin is warm and dry. Capillary refill takes less than 2 seconds. She is not diaphoretic.  Psychiatric: She has a normal mood and affect. Her behavior is normal.  Nursing note and vitals reviewed.   Urgent Care Course     Procedures (including critical care time)  Labs Review Labs Reviewed  POCT PREGNANCY, URINE     Imaging Review No results found.      MDM   1. Abnormal vaginal bleeding    Repeat pregnancy test was negative, she does not currently have any concerning signs or symptoms for significant underlying illness. Most likely diagnosis considered is perimenopause. Recommend following up with a primary care provider for further evaluation and management. Return to clinic if any weakness, dizziness, chest pain, shortness of breath, or significant increase in bleeding.     Barnet Glasgow, NP 04/08/16 1328

## 2016-04-08 NOTE — ED Triage Notes (Signed)
The patient presented to the [facility] with a complaint of abnormal vaginal bleeding.

## 2016-04-08 NOTE — Discharge Instructions (Signed)
Your pregnancy test was negative, your symptoms are consistent with perimenopause. I have attached the contact information for [facility]. Follow up with them for primary care management and for further evaluation of your condition.

## 2016-05-30 ENCOUNTER — Ambulatory Visit (INDEPENDENT_AMBULATORY_CARE_PROVIDER_SITE_OTHER): Payer: Self-pay | Admitting: Obstetrics & Gynecology

## 2016-05-30 ENCOUNTER — Encounter: Payer: Self-pay | Admitting: Obstetrics & Gynecology

## 2016-05-30 VITALS — BP 125/89 | HR 66 | Ht 67.0 in | Wt 179.6 lb

## 2016-05-30 DIAGNOSIS — N926 Irregular menstruation, unspecified: Secondary | ICD-10-CM

## 2016-05-30 NOTE — Progress Notes (Signed)
Pt reports that a few months ago she had no periods for 1.5 months. She had a period last month that lasted a few days. She has not had anymore bleeding since then.

## 2016-05-30 NOTE — Progress Notes (Signed)
   Subjective:    Patient ID: [name], female    DOB: 1970-12-25, 46 y.o.   MRN: [id number]  HPI 46 yo separated AA P3 here for follow up after being seen at the [facility] for her concern of missing a period in February. All of her other periods for years have been monthly.   She does not use contraception, would like a pregancy.   Review of Systems     Objective:   Physical Exam AA F NAD Breathing, conversing, and ambulating normally Flat affect Abd- benign       Assessment & Plan:  Desire for pregnancy- Rec MVI daily to help prevent ONTDs Missed period- reassurance given Check TSH, CBC She was given the phone number for the free pap clinic.

## 2016-05-31 LAB — CBC
HEMATOCRIT: 29.6 % — AB (ref 34.0–46.6)
Hemoglobin: 8.8 g/dL — ABNORMAL LOW (ref 11.1–15.9)
MCH: 22.3 pg — ABNORMAL LOW (ref 26.6–33.0)
MCHC: 29.7 g/dL — AB (ref 31.5–35.7)
MCV: 75 fL — ABNORMAL LOW (ref 79–97)
Platelets: 294 10*3/uL (ref 150–379)
RBC: 3.94 x10E6/uL (ref 3.77–5.28)
RDW: 17.4 % — AB (ref 12.3–15.4)
WBC: 5.2 10*3/uL (ref 3.4–10.8)

## 2016-05-31 LAB — TSH: TSH: 4.42 u[IU]/mL (ref 0.450–4.500)

## 2016-06-05 ENCOUNTER — Telehealth: Payer: Self-pay | Admitting: *Deleted

## 2016-06-05 DIAGNOSIS — N939 Abnormal uterine and vaginal bleeding, unspecified: Secondary | ICD-10-CM

## 2016-06-05 NOTE — Telephone Encounter (Signed)
Attempted to call patient to give results and schedule a gyn ultrasound. No answer and voicemail was full.

## 2016-06-05 NOTE — Telephone Encounter (Signed)
-----   Message from [place], LPN sent at 02/02/2409  8:54 AM EDT -----   ----- Message ----- From: Emily Filbert, MD Sent: 05/31/2016   7:44 AM To: Estill Bamberg A Rash, LPN  Her hbg is pretty low at 8.8. She needs a gyn u/s. Thanks

## 2016-06-13 ENCOUNTER — Encounter: Payer: Self-pay | Admitting: *Deleted

## 2016-06-13 NOTE — Telephone Encounter (Signed)
Ultrasound scheduled for 9 am on 06/27/16. Letter to patient with appointment information.

## 2016-06-27 ENCOUNTER — Ambulatory Visit (HOSPITAL_COMMUNITY)
Admission: RE | Admit: 2016-06-27 | Discharge: 2016-06-27 | Disposition: A | Discharge status: Home / Self Care | Payer: Medicaid Other | Source: Ambulatory Visit | Attending: Obstetrics & Gynecology | Admitting: Obstetrics & Gynecology

## 2016-06-27 DIAGNOSIS — D251 Intramural leiomyoma of uterus: Secondary | ICD-10-CM | POA: Insufficient documentation

## 2016-06-27 DIAGNOSIS — N939 Abnormal uterine and vaginal bleeding, unspecified: Secondary | ICD-10-CM

## 2016-12-27 ENCOUNTER — Encounter (HOSPITAL_COMMUNITY): Payer: Self-pay | Admitting: Emergency Medicine

## 2016-12-27 ENCOUNTER — Emergency Department (HOSPITAL_COMMUNITY)
Admission: EM | Admit: 2016-12-27 | Discharge: 2016-12-28 | Disposition: A | Discharge status: Home / Self Care | Payer: Self-pay | Attending: Emergency Medicine | Admitting: Emergency Medicine

## 2016-12-27 DIAGNOSIS — F22 Delusional disorders: Secondary | ICD-10-CM | POA: Insufficient documentation

## 2016-12-27 DIAGNOSIS — R4689 Other symptoms and signs involving appearance and behavior: Secondary | ICD-10-CM

## 2016-12-27 DIAGNOSIS — Z9119 Patient's noncompliance with other medical treatment and regimen: Secondary | ICD-10-CM | POA: Insufficient documentation

## 2016-12-27 DIAGNOSIS — R456 Violent behavior: Secondary | ICD-10-CM | POA: Insufficient documentation

## 2016-12-27 DIAGNOSIS — F419 Anxiety disorder, unspecified: Secondary | ICD-10-CM | POA: Insufficient documentation

## 2016-12-27 DIAGNOSIS — F3164 Bipolar disorder, current episode mixed, severe, with psychotic features: Secondary | ICD-10-CM

## 2016-12-27 DIAGNOSIS — I10 Essential (primary) hypertension: Secondary | ICD-10-CM | POA: Insufficient documentation

## 2016-12-27 DIAGNOSIS — F329 Major depressive disorder, single episode, unspecified: Secondary | ICD-10-CM | POA: Insufficient documentation

## 2016-12-27 DIAGNOSIS — Z91040 Latex allergy status: Secondary | ICD-10-CM | POA: Insufficient documentation

## 2016-12-27 DIAGNOSIS — Z9114 Patient's other noncompliance with medication regimen: Secondary | ICD-10-CM

## 2016-12-27 DIAGNOSIS — F319 Bipolar disorder, unspecified: Secondary | ICD-10-CM | POA: Insufficient documentation

## 2016-12-27 NOTE — ED Notes (Signed)
Unable to collect labs pt talking to provider
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
## 2016-12-27 NOTE — ED Notes (Signed)
EDP at bedside to speak with patient
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
## 2016-12-27 NOTE — ED Notes (Signed)
Bed: [facility] Expected date:  Expected time:  Means of arrival:  Comments: IVC Female

## 2016-12-28 ENCOUNTER — Other Ambulatory Visit: Payer: Self-pay

## 2016-12-28 ENCOUNTER — Inpatient Hospital Stay
Admission: AD | Admit: 2016-12-28 | Discharge: 2017-01-18 | DRG: 885 | Disposition: A | Discharge status: Home / Self Care | Payer: No Typology Code available for payment source | Attending: Psychiatry | Admitting: Psychiatry

## 2016-12-28 ENCOUNTER — Encounter: Payer: Self-pay | Admitting: *Deleted

## 2016-12-28 DIAGNOSIS — F3164 Bipolar disorder, current episode mixed, severe, with psychotic features: Principal | ICD-10-CM | POA: Diagnosis present

## 2016-12-28 DIAGNOSIS — Z560 Unemployment, unspecified: Secondary | ICD-10-CM | POA: Diagnosis not present

## 2016-12-28 DIAGNOSIS — I10 Essential (primary) hypertension: Secondary | ICD-10-CM | POA: Diagnosis present

## 2016-12-28 DIAGNOSIS — Z803 Family history of malignant neoplasm of breast: Secondary | ICD-10-CM

## 2016-12-28 DIAGNOSIS — F5105 Insomnia due to other mental disorder: Secondary | ICD-10-CM | POA: Diagnosis present

## 2016-12-28 DIAGNOSIS — F312 Bipolar disorder, current episode manic severe with psychotic features: Secondary | ICD-10-CM | POA: Diagnosis present

## 2016-12-28 DIAGNOSIS — Z91040 Latex allergy status: Secondary | ICD-10-CM

## 2016-12-28 DIAGNOSIS — Z9119 Patient's noncompliance with other medical treatment and regimen: Secondary | ICD-10-CM

## 2016-12-28 DIAGNOSIS — F29 Unspecified psychosis not due to a substance or known physiological condition: Secondary | ICD-10-CM | POA: Diagnosis present

## 2016-12-28 DIAGNOSIS — Z8249 Family history of ischemic heart disease and other diseases of the circulatory system: Secondary | ICD-10-CM | POA: Diagnosis not present

## 2016-12-28 LAB — CBC
HCT: 29.5 % — ABNORMAL LOW (ref 36.0–46.0)
Hemoglobin: 8.4 g/dL — ABNORMAL LOW (ref 12.0–15.0)
MCH: 20.1 pg — AB (ref 26.0–34.0)
MCHC: 28.5 g/dL — ABNORMAL LOW (ref 30.0–36.0)
MCV: 70.7 fL — AB (ref 78.0–100.0)
PLATELETS: 387 10*3/uL (ref 150–400)
RBC: 4.17 MIL/uL (ref 3.87–5.11)
RDW: 17.9 % — AB (ref 11.5–15.5)
WBC: 7.5 10*3/uL (ref 4.0–10.5)

## 2016-12-28 LAB — COMPREHENSIVE METABOLIC PANEL
ALT: 10 U/L — AB (ref 14–54)
AST: 26 U/L (ref 15–41)
Albumin: 4.3 g/dL (ref 3.5–5.0)
Alkaline Phosphatase: 46 U/L (ref 38–126)
Anion gap: 9 (ref 5–15)
BILIRUBIN TOTAL: 0.6 mg/dL (ref 0.3–1.2)
BUN: 17 mg/dL (ref 6–20)
CO2: 23 mmol/L (ref 22–32)
CREATININE: 1.18 mg/dL — AB (ref 0.44–1.00)
Calcium: 9.4 mg/dL (ref 8.9–10.3)
Chloride: 106 mmol/L (ref 101–111)
GFR calc Af Amer: >60 mL/min (ref >60)
GFR, EST NON AFRICAN AMERICAN: 55 mL/min — AB (ref >60)
Glucose, Bld: 99 mg/dL (ref 65–99)
Potassium: 3.5 mmol/L (ref 3.5–5.1)
Sodium: 138 mmol/L (ref 135–145)
TOTAL PROTEIN: 7.8 g/dL (ref 6.5–8.1)

## 2016-12-28 LAB — I-STAT BETA HCG BLOOD, ED (MC, WL, AP ONLY)

## 2016-12-28 LAB — RAPID URINE DRUG SCREEN, HOSP PERFORMED
AMPHETAMINES: NOT DETECTED
BARBITURATES: NOT DETECTED
Benzodiazepines: NOT DETECTED
Cocaine: NOT DETECTED
Opiates: NOT DETECTED
Tetrahydrocannabinol: NOT DETECTED

## 2016-12-28 LAB — SALICYLATE LEVEL: Salicylate Lvl: <7 mg/dL (ref 2.8–30.0)

## 2016-12-28 LAB — ACETAMINOPHEN LEVEL: Acetaminophen (Tylenol), Serum: <10 ug/mL — ABNORMAL LOW (ref 10–30)

## 2016-12-28 LAB — ETHANOL

## 2016-12-28 MED ORDER — ACETAMINOPHEN 325 MG PO TABS: 650.0000 mg | ORAL_TABLET | Freq: Four times a day (QID) | ORAL | Status: DC | PRN

## 2016-12-28 MED ORDER — ALUM & MAG HYDROXIDE-SIMETH 200-200-20 MG/5ML PO SUSP
30.0000 mL | Freq: Four times a day (QID) | ORAL | Status: DC | PRN
Start: 2016-12-28 — End: 2016-12-28

## 2016-12-28 MED ORDER — ONDANSETRON HCL 4 MG PO TABS
4.0000 mg | ORAL_TABLET | Freq: Three times a day (TID) | ORAL | Status: DC | PRN
  Filled 2016-12-28: qty 1

## 2016-12-28 MED ORDER — RISPERIDONE 1 MG PO TABS: 1.0000 mg | ORAL_TABLET | Freq: Two times a day (BID) | ORAL | Status: DC

## 2016-12-28 MED ORDER — ALUM & MAG HYDROXIDE-SIMETH 200-200-20 MG/5ML PO SUSP: 30.0000 mL | Freq: Four times a day (QID) | ORAL | Status: DC | PRN

## 2016-12-28 MED ORDER — RISPERIDONE 1 MG PO TABS
1.0000 mg | ORAL_TABLET | Freq: Two times a day (BID) | ORAL | Status: DC
  Administered 2016-12-28: 1 mg via ORAL
  Filled 2016-12-28: qty 1

## 2016-12-28 MED ORDER — OLANZAPINE 5 MG PO TBDP
15.0000 mg | ORAL_TABLET | Freq: Every day | ORAL | Status: DC
  Administered 2016-12-28 – 2017-01-02 (×6): 15 mg via ORAL
  Filled 2016-12-28 (×6): qty 3

## 2016-12-28 MED ORDER — CARBAMAZEPINE ER 200 MG PO TB12
200.0000 mg | ORAL_TABLET | Freq: Two times a day (BID) | ORAL | Status: DC
  Administered 2016-12-28: 200 mg via ORAL
  Filled 2016-12-28: qty 1

## 2016-12-28 MED ORDER — ACETAMINOPHEN 325 MG PO TABS: 650.0000 mg | ORAL_TABLET | ORAL | Status: DC | PRN

## 2016-12-28 MED ORDER — TRAZODONE HCL 100 MG PO TABS
100.0000 mg | ORAL_TABLET | Freq: Every day | ORAL | Status: DC
  Administered 2016-12-28: 100 mg via ORAL
  Filled 2016-12-28 (×3): qty 1

## 2016-12-28 MED ORDER — DOCUSATE SODIUM 100 MG PO CAPS
100.0000 mg | ORAL_CAPSULE | Freq: Every day | ORAL | Status: DC
  Administered 2016-12-29 – 2017-01-18 (×21): 100 mg via ORAL
  Filled 2016-12-28 (×21): qty 1

## 2016-12-28 MED ORDER — DOCUSATE SODIUM 100 MG PO CAPS
100.0000 mg | ORAL_CAPSULE | Freq: Every day | ORAL | Status: DC
  Administered 2016-12-28: 100 mg via ORAL
  Filled 2016-12-28: qty 1

## 2016-12-28 MED ORDER — MAGNESIUM HYDROXIDE 400 MG/5ML PO SUSP: 30.0000 mL | Freq: Every day | ORAL | Status: DC | PRN

## 2016-12-28 MED ORDER — CARBAMAZEPINE ER 100 MG PO TB12
200.0000 mg | ORAL_TABLET | Freq: Two times a day (BID) | ORAL | Status: DC
  Filled 2016-12-28: qty 2

## 2016-12-28 MED ORDER — FERROUS SULFATE 325 (65 FE) MG PO TABS
325.0000 mg | ORAL_TABLET | Freq: Every day | ORAL | Status: DC
  Administered 2016-12-28: 325 mg via ORAL
  Filled 2016-12-28: qty 1

## 2016-12-28 MED ORDER — HYDROXYZINE HCL 25 MG PO TABS
25.0000 mg | ORAL_TABLET | Freq: Three times a day (TID) | ORAL | Status: DC | PRN
  Filled 2016-12-28 (×2): qty 1

## 2016-12-28 MED ORDER — ONDANSETRON HCL 4 MG PO TABS: 4.0000 mg | ORAL_TABLET | Freq: Three times a day (TID) | ORAL | Status: DC | PRN

## 2016-12-28 MED ORDER — FERROUS SULFATE 325 (65 FE) MG PO TABS
325.0000 mg | ORAL_TABLET | Freq: Every day | ORAL | Status: DC
  Administered 2016-12-29 – 2017-01-18 (×21): 325 mg via ORAL
  Filled 2016-12-28 (×21): qty 1

## 2016-12-28 MED ORDER — DIVALPROEX SODIUM 500 MG PO DR TAB
500.0000 mg | DELAYED_RELEASE_TABLET | Freq: Three times a day (TID) | ORAL | Status: DC
  Administered 2016-12-28 – 2017-01-03 (×17): 500 mg via ORAL
  Filled 2016-12-28 (×17): qty 1

## 2016-12-28 MED ORDER — ALUM & MAG HYDROXIDE-SIMETH 200-200-20 MG/5ML PO SUSP: 30.0000 mL | ORAL | Status: DC | PRN

## 2016-12-28 NOTE — ED Notes (Addendum)
Patient changing into paper scrubs. Patient used restroom unaware of need for urine sample. Will attempt to provide a sample later.
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
## 2016-12-28 NOTE — Progress Notes (Signed)
D:Pt denies SI/HI/AVH. Pt. Able to remain safe while on the unit. Pt verbally contracts for safety. Pt is calm, guarded, and cooperative during assessment, but minimal in answering. Pt. has no Complaints.   A: Q x 15 minute observation checks were completed for safety. Patient was provided with education. Pt verbalizes understanding of provided education. Patient was given scheduled medications. Patient  was encourage to attend groups, participate in unit activities and continue with plan of care.   R:Patient is complaint with medication and unit procedures.             Patient slept for Estimated Hours of 8; Precautionary checks every 15 minutes for safety maintained, room free of safety hazards, patient sustains no injury or falls during this shift.

## 2016-12-28 NOTE — ED Notes (Signed)
Pt to conference room for TTS

## 2016-12-28 NOTE — Plan of Care (Signed)
Pt. Able to remain safe while on the unit. Pt verbally contracts for safety and denies SI/HI. Pt verbalizes understanding of provided education.
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
## 2016-12-28 NOTE — Progress Notes (Signed)
Referral information sent to [facility], [place], [place], [place]. Winferd Humphrey, MSW, LCSW Clinical Social Worker 12/28/2016 3:35 AM

## 2016-12-28 NOTE — ED Notes (Signed)
Patient decline to talk to this writer at the time of assessment. Encouragement and support provided and safety maintain. Q 15 min safety checks in place and video monitoring.

## 2016-12-28 NOTE — ED Notes (Signed)
Patient back from TTS

## 2016-12-28 NOTE — Tx Team (Signed)
Initial Treatment Plan 12/28/2016 6:44 PM [name] ENI:[id number]    PATIENT STRESSORS: Financial difficulties Medication change or noncompliance   PATIENT STRENGTHS: Ability for insight Average or above average intelligence Capable of independent living Communication skills Supportive family/friends   PATIENT IDENTIFIED PROBLEMS: Psychosis 12/28/16  Non Compliant Medications 12/28/16                   DISCHARGE CRITERIA:  Ability to meet basic life and health needs Improved stabilization in mood, thinking, and/or behavior  PRELIMINARY DISCHARGE PLAN: Outpatient therapy Return to previous living arrangement  PATIENT/FAMILY INVOLVEMENT: This treatment plan has been presented to and reviewed with the patient, [name], and/or family member, .  The patient and family have been given the opportunity to ask questions and make suggestions.  Vanessa Liverpool, RN 12/28/2016, 6:44 PM

## 2016-12-28 NOTE — ED Notes (Signed)
Patient wanded by security, belongings secured behind nurses station

## 2016-12-28 NOTE — Plan of Care (Signed)
New Admission  unable to attend unit programing

## 2016-12-28 NOTE — ED Provider Notes (Signed)
Pt accepted at [facility].  Stable for transfer Vitals:   12/28/16 0707 12/28/16 1640  BP: 140/83 (!) 92/44  Pulse: 79 60  Resp: 18 14  Temp: 99.4 F (37.4 C) 99.1 F (37.3 C)  SpO2: 100% 100%      Vanessa Rank, MD 12/28/16 [phone]

## 2016-12-28 NOTE — ED Notes (Signed)
Bed: [id number] Expected date:  Expected time:  Means of arrival:  Comments: Hall C 
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
## 2016-12-28 NOTE — BH Assessment (Addendum)
[facility] Assessment Progress Note   Per Buford Dresser, DO, this pt requires psychiatric hospitalization.  [place] reports that pt has been accepted to [facility] by Dr Bary Leriche to Rm 313.  Pt presents under IVC initiated by pt's sister-in-law, and upheld by EDP Theotis Burrow, MD, and IVC documents have been faxed to [phone].  Peyton Bottoms has confirmed receipt.  Pt's nurse, Caren Griffins, has been notified, and agrees to call report to [phone].  Pt is to be transported via [facility].   Jalene Mullet, [place] Coordinator [phone]

## 2016-12-29 LAB — LIPID PANEL
CHOL/HDL RATIO: 2.7 ratio
CHOLESTEROL: 147 mg/dL (ref 0–200)
HDL: 54 mg/dL (ref >40)
LDL Cholesterol: 84 mg/dL (ref 0–99)
TRIGLYCERIDES: 43 mg/dL (ref <150)
VLDL: 9 mg/dL (ref 0–40)

## 2016-12-29 LAB — HEMOGLOBIN A1C
Hgb A1c MFr Bld: 5.1 % (ref 4.8–5.6)
MEAN PLASMA GLUCOSE: 99.67 mg/dL

## 2016-12-29 LAB — TSH: TSH: 2.209 u[IU]/mL (ref 0.350–4.500)

## 2016-12-29 NOTE — Plan of Care (Signed)
Patient compliant with medication, milieu remains safe.

## 2016-12-29 NOTE — H&P (Signed)
Psychiatric Admission Assessment Adult  Patient Identification: [name] MRN:  [id number] Date of Evaluation:  12/29/2016 Chief Complaint:  Psychosis Principal Diagnosis: Bipolar I disorder, current or most recent episode manic, with psychotic features [facility]) Diagnosis:   Patient Active Problem List   Diagnosis Date Noted  . Bipolar I disorder, current or most recent episode manic, with psychotic features ([place]) [F31.2] 12/28/2016    Priority: High  . Bipolar affective disorder, mixed, severe, with psychotic behavior ([place]) [F31.64] 12/28/2016  . Ptosis of both eyelids [H02.403] 01/21/2013   History of Present Illness:   Identifying data. [name] is a 46 year old female with a history of bipolar disorder admitted for psychotic break.   Chief complaint. "They gave me medicines."  History of present illness. Information was obtained from the patient and the chart. The patient was petitioned by her family. Apparently, she has beebn increasingly psychotic, paranoid and disorganized to the point that her children, ages 55, 38, and 33, allerted family members that mother has not been doing well. The patient herself is not providing  Much information today. She is in bed with her eyes closed, refusing to come to the office, either falling asleep or actively hallucinating. She denies any symptoms of depression  "Don't think so" , anxiety or psychosis. She realizes that she was started on medications, has no complaints, but is unable to mane any current or past medications. She denies alcohol or illicit substance use.   Past psychiatric history. She was hospitalized "several times". She only recognizes Risperdal and reportedly was on injections at some point. Denies attempting suicide.  Family psychiatric history. Denies any.  Social history. She lives with her three children in subsidized housing and on food stamps since she lost her job in May. She is not willing to explain  how. She is in the process of divorcing her husband since last year. Their relationship was bad long before that. Denies having any family but according to the chart, her sister-in-law was involved and her father traveled here from [place].  Total Time spent with patient: 1 hour  Is the patient at risk to self? No.  Has the patient been a risk to self in the past 6 months? No.  Has the patient been a risk to self within the distant past? No.  Is the patient a risk to others? No.  Has the patient been a risk to others in the past 6 months? No.  Has the patient been a risk to others within the distant past? No.   Prior Inpatient Therapy:   Prior Outpatient Therapy:    Alcohol Screening: Patient refused Alcohol Screening Tool: Yes 1. How often do you have a drink containing alcohol?: Never 2. How many drinks containing alcohol do you have on a typical day when you are drinking?: 1 or 2 3. How often do you have six or more drinks on one occasion?: Never AUDIT-C Score: 0 4. How often during the last year have you found that you were not able to stop drinking once you had started?: Never 5. How often during the last year have you failed to do what was normally expected from you becasue of drinking?: Never 6. How often during the last year have you needed a first drink in the morning to get yourself going after a heavy drinking session?: Never 7. How often during the last year have you had a feeling of guilt of remorse after drinking?: Never 8. How often during the last year have  you been unable to remember what happened the night before because you had been drinking?: Never 9. Have you or someone else been injured as a result of your drinking?: No 10. Has a relative or friend or a doctor or another health worker been concerned about your drinking or suggested you cut down?: No Alcohol Use Disorder Identification Test Final Score (AUDIT): 0 Intervention/Follow-up: Patient Refused Substance Abuse  History in the last 12 months:  No. Consequences of Substance Abuse: NA Previous Psychotropic Medications: Yes  Psychological Evaluations: No  Past Medical History:  Past Medical History:  Diagnosis Date  . Anxiety   . Depression   . Hypertension     Past Surgical History:  Procedure Laterality Date  . CESAREAN SECTION  2002, 2009   Family History:  Family History  Problem Relation Age of Onset  . Breast cancer Mother        Deceased, 11  . Hypertension Mother   . Arthritis Father   . Hypertension Father   . Healthy Son   . Asthma Daughter   . Diabetes type II Daughter    Tobacco Screening: Have you used any form of tobacco in the last 30 days? (Cigarettes, Smokeless Tobacco, Cigars, and/or Pipes): No Social History:  Social History   Substance and Sexual Activity  Alcohol Use No     Social History   Substance and Sexual Activity  Drug Use No    Additional Social History:                           Allergies:   Allergies  Allergen Reactions  . Latex    Lab Results:  Results for orders placed or performed during the hospital encounter of 12/28/16 (from the past 48 hour(s))  Lipid panel     Status: None   Collection Time: 12/29/16  6:56 AM  Result Value Ref Range   Cholesterol 147 0 - 200 mg/dL   Triglycerides 43 <150 mg/dL   HDL 54 >40 mg/dL   Total CHOL/HDL Ratio 2.7 RATIO   VLDL 9 0 - 40 mg/dL   LDL Cholesterol 84 0 - 99 mg/dL    Comment:        Total Cholesterol/HDL:CHD Risk Coronary Heart Disease Risk Table                     Men   Women  1/2 Average Risk   3.4   3.3  Average Risk       5.0   4.4  2 X Average Risk   9.6   7.1  3 X Average Risk  23.4   11.0        Use the calculated Patient Ratio above and the CHD Risk Table to determine the patient's CHD Risk.        ATP III CLASSIFICATION (LDL):  <100     mg/dL   Optimal  100-129  mg/dL   Near or Above                    Optimal  130-159  mg/dL   Borderline  160-189  mg/dL    High  >190     mg/dL   Very High Performed at [facility], [address] [place], [place] [address]   TSH     Status: None   Collection Time: 12/29/16  6:56 AM  Result Value Ref Range   TSH 2.209  0.350 - 4.500 uIU/mL    Comment: Performed by a 3rd Generation assay with a functional sensitivity of <=0.01 uIU/mL. Performed at [facility], [address] [place], [place] [address]     Blood Alcohol level:  Lab Results  Component Value Date   ETH <10 12/27/2016   [facility]  03/22/2007    <5        LOWEST DETECTABLE LIMIT FOR SERUM ALCOHOL IS 11 mg/dL FOR MEDICAL PURPOSES ONLY    Metabolic Disorder Labs:  No results found for: HGBA1C, MPG No results found for: PROLACTIN Lab Results  Component Value Date   CHOL 147 12/29/2016   TRIG 43 12/29/2016   HDL 54 12/29/2016   CHOLHDL 2.7 12/29/2016   VLDL 9 12/29/2016   LDLCALC 84 12/29/2016    Current Medications: Current Facility-Administered Medications  Medication Dose Route Frequency Provider Last Rate Last Dose  . acetaminophen (TYLENOL) tablet 650 mg  650 mg Oral Q4H PRN [name], NP      . alum & mag hydroxide-simeth (MAALOX/MYLANTA) 200-200-20 MG/5ML suspension 30 mL  30 mL Oral Q6H PRN [name], NP      . divalproex (DEPAKOTE) DR tablet 500 mg  500 mg Oral Q8H Yunique Dearcos B, MD   500 mg at 12/29/16 0607  . docusate sodium (COLACE) capsule 100 mg  100 mg Oral Q breakfast [name], NP   100 mg at 12/29/16 0818  . ferrous sulfate tablet 325 mg  325 mg Oral Q breakfast [name], NP   325 mg at 12/29/16 0818  . hydrOXYzine (ATARAX/VISTARIL) tablet 25 mg  25 mg Oral TID PRN Lugene Hitt B, MD      . magnesium hydroxide (MILK OF MAGNESIA) suspension 30 mL  30 mL Oral Daily PRN [name], NP      . OLANZapine zydis (ZYPREXA) disintegrating tablet 15 mg  15 mg Oral QHS Shavanna Furnari B, MD   15 mg at 12/28/16 2126  . traZODone (DESYREL) tablet 100 mg  100 mg  Oral QHS Ashawnti Tangen B, MD   100 mg at 12/28/16 2126   PTA Medications: No medications prior to admission.    Musculoskeletal: Strength & Muscle Tone: within normal limits Gait & Station: normal Patient leans: N/A  Psychiatric Specialty Exam: I reviewed physical examination performed in the ER and agree with the findings. Physical Exam  Nursing note and vitals reviewed. Psychiatric: Her affect is blunt. Her speech is delayed. She is withdrawn and actively hallucinating. Thought content is paranoid and delusional. Cognition and memory are impaired. She expresses impulsivity.    Review of Systems  Neurological: Negative.   Psychiatric/Behavioral: Positive for hallucinations. The patient has insomnia.   All other systems reviewed and are negative.   Blood pressure 117/73, pulse 70, temperature 98.9 F (37.2 C), resp. rate 16, height 5\' 7"  (1.702 m), weight 72.6 kg (160 lb), SpO2 100 %.Body mass index is 25.06 kg/m.  See SRA                                                  Sleep:       Treatment Plan Summary: Daily contact with patient to assess and evaluate symptoms and progress in treatment and Medication management   [name] is a 46 year old female with a history of bipolar  disorder admitted for psychotic break in the context of medication noncompliance.  #Mood and psychosis -start Depakote 500 mg TID -start Zyprexa 15 mg nightly  #Insomnia -Trazodone is available  #Metabolic syndrome monitoring -Lipid panel, TSH and HgbA1C are pending -EKG, pending -pregnancy test is negative  #Disposition -discharge with family -needs mental health follow up    Observation Level/Precautions:  15 minute checks  Laboratory:  CBC Chemistry Profile UDS UA  Psychotherapy:    Medications:    Consultations:    Discharge Concerns:    Estimated LOS:  Other:     Physician Treatment Plan for Primary Diagnosis: Bipolar I disorder,  current or most recent episode manic, with psychotic features ([place]) Long Term Goal(s): Improvement in symptoms so as ready for discharge  Short Term Goals: Ability to identify changes in lifestyle to reduce recurrence of condition will improve, Ability to verbalize feelings will improve, Ability to disclose and discuss suicidal ideas, Ability to demonstrate self-control will improve, Ability to identify and develop effective coping behaviors will improve, Ability to maintain clinical measurements within normal limits will improve, Compliance with prescribed medications will improve and Ability to identify triggers associated with substance abuse/mental health issues will improve  Physician Treatment Plan for Secondary Diagnosis: Principal Problem:   Bipolar I disorder, current or most recent episode manic, with psychotic features ([place])  Long Term Goal(s): NA  Short Term Goals: NA  I certify that inpatient services furnished can reasonably be expected to improve the patient's condition.    Orson Slick, MD 12/29/201810:46 AM

## 2016-12-29 NOTE — BHH Group Notes (Signed)
LCSW Group Therapy Note  12/29/2016 1:15pm  Type of Therapy and Topic: Group Therapy: Holding on to Grudges   Participation Level: Did Not Attend   Description of Group:  In this group patients will be asked to explore and define a grudge. Patients will be guided to discuss their thoughts, feelings, and reasons as to why people have grudges. Patients will process the impact grudges have on daily life and identify thoughts and feelings related to holding grudges. Facilitator will challenge patients to identify ways to let go of grudges and the benefits this provides. Patients will be confronted to address why one struggles letting go of grudges. Lastly, patients will identify feelings and thoughts related to what life would look like without grudges. This group will be process-oriented, with patients participating in exploration of their own experiences, giving and receiving support, and processing challenge from other group members.  Therapeutic Goals:  1. Patient will identify specific grudges related to their personal life.  2. Patient will identify feelings, thoughts, and beliefs around grudges.  3. Patient will identify how one releases grudges appropriately.  4. Patient will identify situations where they could have let go of the grudge, but instead chose to hold on.   Summary of Patient Progress:   Therapeutic Modalities:  Cognitive Behavioral Therapy  Solution Focused Therapy  Motivational Interviewing  Brief Therapy   Torence Palmeri  CUEBAS-COLON, LCSW 12/29/2016 10:20 AM

## 2016-12-29 NOTE — BHH Suicide Risk Assessment (Signed)
[facility] Admission Suicide Risk Assessment   Nursing information obtained from:    Demographic factors:    Current Mental Status:    Loss Factors:    Historical Factors:    Risk Reduction Factors:     Total Time spent with patient: 1 hour Principal Problem: Bipolar I disorder, current or most recent episode manic, with psychotic features [facility]) Diagnosis:   Patient Active Problem List   Diagnosis Date Noted  . Bipolar I disorder, current or most recent episode manic, with psychotic features ([place]) [F31.2] 12/28/2016    Priority: High  . Bipolar affective disorder, mixed, severe, with psychotic behavior ([place]) [F31.64] 12/28/2016  . Ptosis of both eyelids [H02.403] 01/21/2013   Subjective Data: psychotic break  Continued Clinical Symptoms:  Alcohol Use Disorder Identification Test Final Score (AUDIT): 0 The "Alcohol Use Disorders Identification Test", Guidelines for Use in Primary Care, Second Edition.  World Pharmacologist [facility]). Score between 0-7:  no or low risk or alcohol related problems. Score between 8-15:  moderate risk of alcohol related problems. Score between 16-19:  high risk of alcohol related problems. Score 20 or above:  warrants further diagnostic evaluation for alcohol dependence and treatment.   CLINICAL FACTORS:   Bipolar Disorder:   Mixed State Currently Psychotic Unstable or Poor Therapeutic Relationship Previous Psychiatric Diagnoses and Treatments   Musculoskeletal: Strength & Muscle Tone: within normal limits Gait & Station: normal Patient leans: N/A  Psychiatric Specialty Exam: Physical Exam  Nursing note and vitals reviewed. Psychiatric: Her affect is blunt. Her speech is delayed. She is slowed, withdrawn and actively hallucinating. Thought content is paranoid and delusional. Cognition and memory are normal. She expresses impulsivity.    Review of Systems  Neurological: Negative.   Psychiatric/Behavioral: Positive for hallucinations. The patient  has insomnia.   All other systems reviewed and are negative.   Blood pressure 117/73, pulse 70, temperature 98.9 F (37.2 C), resp. rate 16, height 5\' 7"  (1.702 m), weight 72.6 kg (160 lb), SpO2 100 %.Body mass index is 25.06 kg/m.  General Appearance: Fairly Groomed  Eye Contact:  Minimal  Speech:  Blocked and Slow  Volume:  Decreased  Mood:  Dysphoric  Affect:  Blunt  Thought Process:  Disorganized and Descriptions of Associations: Tangential  Orientation:  Full (Time, Place, and Person)  Thought Content:  Delusions, Hallucinations: Auditory and Paranoid Ideation  Suicidal Thoughts:  No  Homicidal Thoughts:  No  Memory:  Immediate;   Fair Recent;   Fair Remote;   Fair  Judgement:  Poor  Insight:  Lacking  Psychomotor Activity:  Psychomotor Retardation  Concentration:  Concentration: Fair and Attention Span: Fair  Recall:  [employer] of Knowledge:  Fair  Language:  Fair  Akathisia:  No  Handed:  Right  AIMS (if indicated):     Assets:  Communication Skills Desire for Improvement Financial Resources/Insurance Housing Physical Health Resilience Social Support  ADL's:  Intact  Cognition:  WNL  Sleep:         COGNITIVE FEATURES THAT CONTRIBUTE TO RISK:  None    SUICIDE RISK:   Moderate:  Frequent suicidal ideation with limited intensity, and duration, some specificity in terms of plans, no associated intent, good self-control, limited dysphoria/symptomatology, some risk factors present, and identifiable protective factors, including available and accessible social support.  PLAN OF CARE:   Hospital admission, medication management, discharge planning.  Ms. Want is a 46 year old female with a history of bipolar disorder admitted for psychotic break in the context  of medication noncompliance.  #Mood and psychosis -start Depakote 500 mg TID -start Zyprexa 15 mg nightly  #Insomnia -Trazodone is available  #Metabolic syndrome monitoring -Lipid panel,  TSH and HgbA1C are pending -EKG, pending -pregnancy test is negative  #Disposition -discharge with family -needs mental health follow up  I certify that inpatient services furnished can reasonably be expected to improve the patient's condition.   Orson Slick, MD 12/29/2016, 10:39 AM

## 2016-12-29 NOTE — Progress Notes (Signed)
Patient is alert and oriented, currently denies SI/HI/AVH. Patient verbally contracts for safety. Patient is calm but guarded and exhibits selective mutism with staff and peers.  Patient declined to eat her breakfast stating, "Nah, I don't want it." In bed majority of shift resting with eyes closed. Emotional support and encouragement provided to patient.

## 2016-12-29 NOTE — Progress Notes (Signed)
Chaplain responded to an OR for pt in [id number]. [place] met pt in the consult Room. Pt guarded the entire time of the visit and talked briefly only answering obvious question such as how are you feeling "I am feeling okay," responded. Pt declined to talk and refused to commit to having another meeting with [place]. Pt appeared calm but nervous as she kept on shaking her right leg. CH is available to follow up as needed.    12/29/16 1700  Clinical Encounter Type  Visited With Patient;Health care provider  Visit Type Initial;Other (Comment)  Referral From Nurse  Consult/Referral To Chaplain  Spiritual Encounters  Spiritual Needs Other (Comment)

## 2016-12-29 NOTE — BHH Group Notes (Signed)
[place] Group Notes:  (Nursing/MHT/Case Management/Adjunct)  Date:  12/29/2016  Time:  9:58 PM  Type of Therapy:  Group Therapy  Participation Level:  Active  Participation Quality:  Appropriate  Affect:  Appropriate  Cognitive:  Alert  Insight:  Good  Engagement in Group:  Engaged  Modes of Intervention:  Support  Summary of Progress/Problems:  [name] 12/29/2016, 9:58 PM

## 2016-12-30 NOTE — BHH Counselor (Signed)
Adult Comprehensive Assessment  Patient ID: [name], female   DOB: 04-01-1970, 46 y.o.   MRN: [id number]  Information Source: Information source: Patient  Current Stressors:  Educational / Learning stressors: none reported Employment / Job issues: none reported Family Relationships: good Museum/gallery curator / Lack of resources (include bankruptcy): lost job in May Housing / Lack of housing: renting a house Physical health (include injuries & life threatening diseases): none reported Social relationships: pt reports that she does not have friends, "I've never been good at having friends" Substance abuse: none reported Bereavement / Loss: Pt reports that she lost her mom when she was 89 years old  Living/Environment/Situation:  Living Arrangements: Children, Other relatives Living conditions (as described by patient or guardian): good How long has patient lived in current situation?: 9 years What is atmosphere in current home: Comfortable, Loving  Family History:  Marital status: Separated Separated, when?: 2009 What types of issues is patient dealing with in the relationship?: Pt states "a lot of things" Additional relationship information: Pt reports that she has been seaprated since 2009 and is in the process of divorce Are you sexually active?: (pt did not answer question) What is your sexual orientation?: pt did not answer question Has your sexual activity been affected by drugs, alcohol, medication, or emotional stress?: pt did not answer question Does patient have children?: Yes How many children?: 3 How is patient's relationship with their children?: 3 children 50 yo, 64yo, 30 yo, pt reports they have a good relationship  Childhood History:  By whom was/is the patient raised?: Mother Description of patient's relationship with caregiver when they were a child: good Patient's description of current relationship with people who raised him/her: mom is deceased, dad not  around How were you disciplined when you got in trouble as a child/adolescent?: pt did not answer question Does patient have siblings?: Yes Number of Siblings: 4 Description of patient's current relationship with siblings: pt reports that she "used to have a relationship with them" Did patient suffer any verbal/emotional/physical/sexual abuse as a child?: No Did patient suffer from severe childhood neglect?: No Has patient ever been sexually abused/assaulted/raped as an adolescent or adult?: No Was the patient ever a victim of a crime or a disaster?: No Witnessed domestic violence?: No Has patient been effected by domestic violence as an adult?: No  Education:  Highest grade of school patient has completed: Associate's Degree Currently a student?: No Learning disability?: No  Employment/Work Situation:   Employment situation: Unemployed Patient's job has been impacted by current illness: No What is the longest time patient has a held a job?: 5 years 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 Has patient ever been in the [employer]?: No Has patient ever served in combat?: No Did You Receive Any Psychiatric Treatment/Services While in Passenger transport manager?: No Are There Guns or Other Weapons in Day?: No  Financial Resources:   Financial resources: No income  Alcohol/Substance Abuse:   What has been your use of drugs/alcohol within the last 12 months?: none reported If attempted suicide, did drugs/alcohol play a role in this?: No Alcohol/Substance Abuse Treatment Hx: Denies past history Has alcohol/substance abuse ever caused legal problems?: No  Social Support System:   Heritage manager System: None Describe Community Support System: her children Type of faith/religion: Darrick Meigs How does patient's faith help to cope with current illness?: praying, reading the bible, singing, meditating  Leisure/Recreation:   Leisure and Hobbies: going to  church  Strengths/Needs:  What things does the patient do well?: good parent In what areas does patient struggle / problems for patient: marriage disaster  Discharge Plan:   Does patient have access to transportation?: No Plan for no access to transportation at discharge: public transportation Will patient be returning to same living situation after discharge?: Yes Currently receiving community mental health services: No If no, would patient like referral for services when discharged?: Yes (What county?)([place]) Does patient have financial barriers related to discharge medications?: Yes(unable to pay for any medications)  Summary/Recommendations:   Summary and Recommendations (to be completed by the evaluator): Patient is a 46 year old female admitted with a history of bipolar disorder admitted for psychosis. Patient will benefit from crisis stabilization, medication evaluation, group therapy and psychoeducation. In addition to case management for discharge planning. At discharge it is recommended that patient adhere to the established discharge plan and continue treatment.   Ivonne [place]  CUEBAS-COLON, LCSWA. 12/30/2016

## 2016-12-30 NOTE — BHH Group Notes (Signed)
  [place] Group Notes:  (Nursing/MHT/Case Management/Adjunct)  Date:  12/30/2016  Time:  9:21 PM  Type of Therapy:  Group Therapy  Participation Level:  Active  Participation Quality:  Appropriate  Affect:  Appropriate  Cognitive:  Appropriate  Insight:  Appropriate  Engagement in Group:  Engaged  Modes of Intervention:  Discussion  Summary of Progress/Problems:  Kandis Fantasia 12/30/2016, 9:21 PM

## 2016-12-30 NOTE — [facility] Group Notes (Signed)
LCSW Group Therapy Note 12/30/2016 1:15pm Type of Therapy and Topic: Group Therapy: Feelings Around Returning Home & Establishing a Supportive Framework and Supporting Oneself When Supports Not Available Participation Level: None Description of Group:  Patients first processed thoughts and feelings about upcoming discharge. These included fears of upcoming changes, lack of change, new living environments, judgements and expectations from others and overall stigma of mental health issues. The group then discussed the definition of a supportive framework, what that looks and feels like, and how do to discern it from an unhealthy non-supportive network. The group identified different types of supports as well as what to do when your family/friends are less than helpful or unavailable  Therapeutic Goals  1. Patient will identify one healthy supportive network that they can use at discharge. 2. Patient will identify one factor of a supportive framework and how to tell it from an unhealthy network. 3. Patient able to identify one coping skill to use when they do not have positive supports from others. 4. Patient will demonstrate ability to communicate their needs through discussion and/or role plays.  Summary of Patient Progress: Pt attended group but did not participate.   Therapeutic Modalities Cognitive Behavioral Therapy Motivational Interviewing   Keaton Beichner  CUEBAS-COLON, LCSW 12/30/2016 9:27 AM

## 2016-12-30 NOTE — Progress Notes (Signed)
Patient presents with sad, flat affect. Isolates to self and room. Forwards minimal. No interaction with staff or peers. Comes out for meals and meds. Did attend group today. EKG completed.  Encouragement and support offered. Safety checks maintained. Pt remains safe on unit with q 15 min checks.

## 2016-12-30 NOTE — Progress Notes (Signed)
D:Pt denies SI/HI/AVH. Pt able to remain safe while on the unit. Pt verbally contracts for safety. Pt is flat, blunted, and guarded upon interaction. Pt has no complaints this evening.  Patient appears very apathetic during most interactions. Pt cooperates during assessment, answering minimally. No observed psychotic symptoms this evening.  A: Q x 15 minute observation checks were completed for safety. Patient was provided with education. Pt verbalizes understanding of provided education on general and prescribed therapeutic regimen. Patient was given scheduled medications, but did not want her Trazodone. Patient  was encourage to attend groups, participate in unit activities and continue with plan of care.   R:Patient is complaint with medication besides sleep aid, and is complaint with unit procedures/groups.              Patient slept for Estimated Hours of 8.30; Precautionary checks every 15 minutes for safety maintained, room free of safety hazards, patient sustains no injury or falls during this shift.

## 2016-12-30 NOTE — Plan of Care (Signed)
No observed psychotic symptoms this evening. Pt verbalizes understanding of provided education on general and prescribed therapeutic regimen. Pt able to remain safe while on the unit. Pt verbally contracts for safety and denies SI/HI.

## 2016-12-30 NOTE — Progress Notes (Signed)
D:Pt denies SI/HI/AVH. Pt able to remain safe while on the unit. Pt verbally contracts for safety. Pt is flat, blunted, and guarded upon interaction. Pt has no complaints this evening.Patient appears very apathetic during most interactions. Pt cooperates during assessment, answering minimally. No observed psychotic symptoms this evening.  A: Q x 15 minute observation checks were completed for safety. Patient was provided with education. Pt verbalizes understanding of provided education on general and prescribed therapeutic regimen. Patient was given scheduled medications, but did not want her Trazodone for sleep-aid. Patient was encourage to attend groups, participate in unit activities and continue with plan of care.   R:Patient is complaint with medication besides sleep aid, and is complaint with unit procedures/groups.              Patient slept for Estimated Hours of 8; Precautionary checks every 15 minutes for safety maintained, room free of safety hazards, patient sustains no injury or falls during this shift.

## 2016-12-30 NOTE — Plan of Care (Signed)
  Progressing Education: Knowledge of the prescribed therapeutic regimen will improve 12/30/2016 1539 - Progressing by Jacinto Halim, RN Safety: Ability to remain free from injury will improve 12/30/2016 1539 - Progressing by Jacinto Halim, RN

## 2016-12-30 NOTE — Progress Notes (Signed)
[facility] MD Progress Note  12/30/2016 1:37 PM [name]  MRN:  [id number]  Subjective:   [name] is rather angry today. She did participate in social work interview today and has been accepting medications. Denies any symptoms of depression, anxiety or psychosis. Denies history of mental illness. No somatic complaints. She did go to group today.  Treatment plan. We will continue Zyprexa and Depakote. VPA level on 01/02/2016.  Social/disposition. She will be discharged with family. Follow up with [facility].  Principal Problem: Bipolar I disorder, current or most recent episode manic, with psychotic features ([place]) Diagnosis:   Patient Active Problem List   Diagnosis Date Noted  . Bipolar I disorder, current or most recent episode manic, with psychotic features ([place]) [F31.2] 12/28/2016    Priority: High  . Bipolar affective disorder, mixed, severe, with psychotic behavior ([place]) [F31.64] 12/28/2016  . 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 Anxiety   . Depression   . Hypertension     Past Surgical History:  Procedure Laterality Date  . CESAREAN SECTION  2002, 2009   Family History:  Family History  Problem Relation Age of Onset  . Breast cancer Mother        Deceased, 35  . Hypertension Mother   . Arthritis Father   . Hypertension Father   . Healthy Son   . Asthma Daughter   . Diabetes type II Daughter    Family Psychiatric  History: none reported Social History:  Social History   Substance and Sexual Activity  Alcohol Use No     Social History   Substance and Sexual Activity  Drug Use No    Social History   Socioeconomic History  . Marital status: Married    Spouse name: None  . Number of children: None  . Years of education: None  . Highest education level: None  Social Needs  .  Financial resource strain: None  . Food insecurity - worry: None  . Food insecurity - inability: None  . Transportation needs - medical: None  . Transportation needs - non-medical: None  Occupational History  . None  Tobacco Use  . Smoking status: Never Smoker  . Smokeless tobacco: Never Used  Substance and Sexual Activity  . Alcohol use: No  . Drug use: No  . Sexual activity: None  Other Topics Concern  . None  Social History Narrative   She lives at home with 2 sons (76, 5) and 1 daughter (34).   She is currently not working.  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  She moved from [place] in 2013.   Additional Social History:                         Sleep: Fair  Appetite:  Fair  Current Medications: Current Facility-Administered Medications  Medication Dose Route Frequency Provider Last Rate Last Dose  . acetaminophen (TYLENOL) tablet 650 mg  650 mg Oral Q4H PRN [name], NP      . alum & mag hydroxide-simeth (MAALOX/MYLANTA) 200-200-20 MG/5ML suspension 30 mL  30 mL Oral Q6H PRN [name], NP      . divalproex (DEPAKOTE) DR tablet 500 mg  500 mg Oral Q8H Armon Orvis B, MD   500 mg  at 12/30/16 0559  . docusate sodium (COLACE) capsule 100 mg  100 mg Oral Q breakfast [name], NP   100 mg at 12/30/16 0751  . ferrous sulfate tablet 325 mg  325 mg Oral Q breakfast [name], NP   325 mg at 12/30/16 4098  . hydrOXYzine (ATARAX/VISTARIL) tablet 25 mg  25 mg Oral TID PRN Crayton Savarese B, MD      . magnesium hydroxide (MILK OF MAGNESIA) suspension 30 mL  30 mL Oral Daily PRN [name], NP      . OLANZapine zydis (ZYPREXA) disintegrating tablet 15 mg  15 mg Oral QHS Jerald Villalona B, MD   15 mg at 12/29/16 2117  . traZODone (DESYREL) tablet 100 mg  100 mg Oral QHS Itzel Mckibbin B, MD   100 mg at 12/28/16 2126    Lab Results:  Results for orders placed or performed  during the hospital encounter of 12/28/16 (from the past 48 hour(s))  Hemoglobin A1c     Status: None   Collection Time: 12/29/16  6:56 AM  Result Value Ref Range   Hgb A1c MFr Bld 5.1 4.8 - 5.6 %    Comment: (NOTE) Pre diabetes:          5.7%-6.4% Diabetes:              >6.4% Glycemic control for   <7.0% adults with diabetes    Mean Plasma Glucose 99.67 mg/dL    Comment: Performed at [facility] [address] [place], [place] [address]  Lipid panel     Status: None   Collection Time: 12/29/16  6:56 AM  Result Value Ref Range   Cholesterol 147 0 - 200 mg/dL   Triglycerides 43 <150 mg/dL   HDL 54 >40 mg/dL   Total CHOL/HDL Ratio 2.7 RATIO   VLDL 9 0 - 40 mg/dL   LDL Cholesterol 84 0 - 99 mg/dL    Comment:        Total Cholesterol/HDL:CHD Risk Coronary Heart Disease Risk Table                     Men   Women  1/2 Average Risk   3.4   3.3  Average Risk       5.0   4.4  2 X Average Risk   9.6   7.1  3 X Average Risk  23.4   11.0        Use the calculated Patient Ratio above and the CHD Risk Table to determine the patient's CHD Risk.        ATP III CLASSIFICATION (LDL):  <100     mg/dL   Optimal  100-129  mg/dL   Near or Above                    Optimal  130-159  mg/dL   Borderline  160-189  mg/dL   High  >190     mg/dL   Very High Performed at [facility], [address] [place], [place] [address]   TSH     Status: None   Collection Time: 12/29/16  6:56 AM  Result Value Ref Range   TSH 2.209 0.350 - 4.500 uIU/mL    Comment: Performed by a 3rd Generation assay with a functional sensitivity of <=0.01 uIU/mL. Performed at [facility], [address], [place], [place] [address]     Blood Alcohol level:  Lab  Results  Component Value Date   ETH <10 12/27/2016   ETH  03/22/2007    <5        LOWEST DETECTABLE LIMIT FOR SERUM ALCOHOL IS 11 mg/dL FOR MEDICAL PURPOSES ONLY    Metabolic Disorder Labs: Lab Results  Component Value Date    HGBA1C 5.1 12/29/2016   MPG 99.67 12/29/2016   No results found for: PROLACTIN Lab Results  Component Value Date   CHOL 147 12/29/2016   TRIG 43 12/29/2016   HDL 54 12/29/2016   CHOLHDL 2.7 12/29/2016   VLDL 9 12/29/2016   LDLCALC 84 12/29/2016    Physical Findings: AIMS: Facial and Oral Movements Muscles of Facial Expression: None, normal Lips and Perioral Area: None, normal Jaw: None, normal Tongue: None, normal,Extremity Movements Upper (arms, wrists, hands, fingers): None, normal Lower (legs, knees, ankles, toes): None, normal, Trunk Movements Neck, shoulders, hips: None, normal, Overall Severity Severity of abnormal movements (highest score from questions above): None, normal Incapacitation due to abnormal movements: None, normal Patient's awareness of abnormal movements (rate only patient's report): No Awareness, Dental Status Current problems with teeth and/or dentures?: No Does patient usually wear dentures?: No  CIWA:    COWS:     Musculoskeletal: Strength & Muscle Tone: within normal limits Gait & Station: normal Patient leans: N/A  Psychiatric Specialty Exam: Physical Exam  Nursing note and vitals reviewed. Psychiatric: Her speech is normal. Her affect is angry. She is withdrawn. Thought content is paranoid and delusional. Cognition and memory are normal. She expresses impulsivity.    Review of Systems  Neurological: Negative.   Psychiatric/Behavioral: Positive for hallucinations.  All other systems reviewed and are negative.   Blood pressure 110/71, pulse 73, temperature 98.6 F (37 C), temperature source Oral, resp. rate 18, height 5\' 7"  (1.702 m), weight 72.6 kg (160 lb), SpO2 100 %.Body mass index is 25.06 kg/m.  General Appearance: Casual  Eye Contact:  Fair  Speech:  Clear and Coherent  Volume:  Normal  Mood:  Angry  Affect:  Congruent  Thought Process:  Disorganized and Descriptions of Associations: Tangential  Orientation:  Full (Time,  Place, and Person)  Thought Content:  Delusions, Hallucinations: Auditory and Paranoid Ideation  Suicidal Thoughts:  No  Homicidal Thoughts:  No  Memory:  Immediate;   Fair Recent;   Fair Remote;   Fair  Judgement:  Poor  Insight:  Lacking  Psychomotor Activity:  Normal  Concentration:  Concentration: Fair and Attention Span: Fair  Recall:  [employer] of Knowledge:  Fair  Language:  Fair  Akathisia:  No  Handed:  Right  AIMS (if indicated):     Assets:  Communication Skills Desire for Improvement Housing Physical Health Resilience Social Support  ADL's:  Intact  Cognition:  WNL  Sleep:        Treatment Plan Summary: Daily contact with patient to assess and evaluate symptoms and progress in treatment and Medication management   [name] is a 45 year old female with a history of bipolar disorder admitted for psychotic break in the context of medication noncompliance.  #Mood and psychosis -start Depakote 500 mg TID -start Zyprexa 15 mg nightly  #Insomnia -Trazodone is available  #Metabolic syndrome monitoring -Lipid panel, TSH and HgbA1C are normal -EKG, pending -pregnancy test is negative  #Disposition -discharge with family -follow up with [facility]    Orson Slick, MD 12/30/2016, 1:37 PM

## 2016-12-30 NOTE — BHH Suicide Risk Assessment (Signed)
[facility] INPATIENT:  Family/Significant Other Suicide Prevention Education  Suicide Prevention Education:  Patient Refusal for Family/Significant Other Suicide Prevention Education: The patient [name] has refused to provide written consent for family/significant other to be provided Family/Significant Other Suicide Prevention Education during admission and/or prior to discharge.  Physician notified.  Yazan Gatling  CUEBAS-COLON, [place] 12/30/2016, 5:03 PM

## 2016-12-31 DIAGNOSIS — F312 Bipolar disorder, current episode manic severe with psychotic features: Secondary | ICD-10-CM

## 2016-12-31 MED ORDER — TRAZODONE HCL 100 MG PO TABS
100.0000 mg | ORAL_TABLET | Freq: Every evening | ORAL | Status: DC | PRN
  Administered 2017-01-12: 100 mg via ORAL
  Filled 2016-12-31 (×3): qty 1

## 2016-12-31 NOTE — Progress Notes (Signed)
[facility] MD Progress Note  12/31/2016 1:02 PM [name]  MRN:  [id number]  Subjective:   [name] met with treatment team today. [name] still appears psychotic. [name] believes her medication dosage is too high and does not want sleeping pill. [name] did not call her family as of yet, there are 3 children ags 9, 4, and 77. [name] does not worry about them. [name] could not indicate family member to contact. No somatic complaints. Started going to groups.  Treatment plan. We will continue Zyprexa and Depakote.  Social/disposition. [name] will be discharged with family. Follow up with [facility].  Principal Problem: Bipolar I disorder, current or most recent episode manic, with psychotic features ([place]) Diagnosis:   Patient Active Problem List   Diagnosis Date Noted  . Bipolar I disorder, current or most recent episode manic, with psychotic features ([place]) [F31.2] 12/28/2016    Priority: High  . Bipolar affective disorder, mixed, severe, with psychotic behavior ([place]) [F31.64] 12/28/2016  . 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  Past Medical History:  Past Medical History:  Diagnosis Date  . Anxiety   . Depression   . Hypertension     Past Surgical History:  Procedure Laterality Date  . CESAREAN SECTION  2002, 2009   Family History:  Family History  Problem Relation Age of Onset  . Breast cancer Mother        Deceased, 63  . Hypertension Mother   . Arthritis Father   . Hypertension Father   . Healthy Son   . Asthma Daughter   . Diabetes type II Daughter    Family Psychiatric  History: none reported Social History:  Social History   Substance and Sexual Activity  Alcohol Use No     Social History   Substance and Sexual Activity  Drug Use No    Social History   Socioeconomic History  . Marital status: Married    Spouse name: None  . Number of children: None  . Years of  education: None  . Highest education level: None  Social Needs  . Financial resource strain: None  . Food insecurity - worry: None  . Food insecurity - inability: None  . Transportation needs - medical: None  . Transportation needs - non-medical: None  Occupational History  . None  Tobacco Use  . Smoking status: Never Smoker  . Smokeless tobacco: Never Used  Substance and Sexual Activity  . Alcohol use: No  . Drug use: No  . Sexual activity: None  Other Topics Concern  . None  Social History Narrative   [name] lives at home with 2 sons (60, 41) and 1 daughter (9).   [name] is currently not working.  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  [name] moved from [place] in 2013.   Additional Social History:                         Sleep: Fair  Appetite:  Fair  Current Medications: Current Facility-Administered Medications  Medication Dose Route Frequency Provider Last Rate Last Dose  . acetaminophen (TYLENOL) tablet 650 mg  650 mg Oral Q4H PRN [name], NP      . alum & mag hydroxide-simeth (MAALOX/MYLANTA) 200-200-20 MG/5ML suspension 30 mL  30 mL Oral Q6H PRN [name], NP      .  divalproex (DEPAKOTE) DR tablet 500 mg  500 mg Oral Q8H Jamarea Selner B, MD   500 mg at 12/31/16 0544  . docusate sodium (COLACE) capsule 100 mg  100 mg Oral Q breakfast [name], NP   100 mg at 12/31/16 0851  . ferrous sulfate tablet 325 mg  325 mg Oral Q breakfast [name], NP   325 mg at 12/31/16 8937  . hydrOXYzine (ATARAX/VISTARIL) tablet 25 mg  25 mg Oral TID PRN Ajay Strubel B, MD      . magnesium hydroxide (MILK OF MAGNESIA) suspension 30 mL  30 mL Oral Daily PRN [name], NP      . OLANZapine zydis (ZYPREXA) disintegrating tablet 15 mg  15 mg Oral QHS Lola Lofaro B, MD   15 mg at 12/30/16 2118  . traZODone (DESYREL) tablet 100 mg  100 mg Oral QHS Jaaliyah Lucatero B, MD   100 mg at 12/28/16  2126    Lab Results: No results found for this or any previous visit (from the past 48 hour(s)).  Blood Alcohol level:  Lab Results  Component Value Date   ETH <10 12/27/2016   ETH  03/22/2007    <5        LOWEST DETECTABLE LIMIT FOR SERUM ALCOHOL IS 11 mg/dL FOR MEDICAL PURPOSES ONLY    Metabolic Disorder Labs: Lab Results  Component Value Date   HGBA1C 5.1 12/29/2016   MPG 99.67 12/29/2016   No results found for: PROLACTIN Lab Results  Component Value Date   CHOL 147 12/29/2016   TRIG 43 12/29/2016   HDL 54 12/29/2016   CHOLHDL 2.7 12/29/2016   VLDL 9 12/29/2016   LDLCALC 84 12/29/2016    Physical Findings: AIMS: Facial and Oral Movements Muscles of Facial Expression: None, normal Lips and Perioral Area: None, normal Jaw: None, normal Tongue: None, normal,Extremity Movements Upper (arms, wrists, hands, fingers): None, normal Lower (legs, knees, ankles, toes): None, normal, Trunk Movements Neck, shoulders, hips: None, normal, Overall Severity Severity of abnormal movements (highest score from questions above): None, normal Incapacitation due to abnormal movements: None, normal Patient's awareness of abnormal movements (rate only patient's report): No Awareness, Dental Status Current problems with teeth and/or dentures?: No Does patient usually wear dentures?: No  CIWA:    COWS:     Musculoskeletal: Strength & Muscle Tone: within normal limits Gait & Station: normal Patient leans: N/A  Psychiatric Specialty Exam: Physical Exam  Nursing note and vitals reviewed. Psychiatric: Judgment and thought content normal. Her affect is blunt. Her speech is delayed. [name] is withdrawn. Cognition and memory are normal.    Review of Systems  Neurological: Negative.   Psychiatric/Behavioral: Positive for hallucinations.  All other systems reviewed and are negative.   Blood pressure 118/70, pulse 70, temperature 98.5 F (36.9 C), temperature source Oral, resp. rate 18,  height _0  (1.702 m), weight 72.6 kg (160 lb), SpO2 100 %.Body mass index is 25.06 kg/m.  General Appearance: Casual  Eye Contact:  Poor  Speech:  Slow  Volume:  Decreased  Mood:  Depressed  Affect:  Congruent  Thought Process:  Goal Directed and Descriptions of Associations: Tangential  Orientation:  Full (Time, Place, and Person)  Thought Content:  Delusions and Paranoid Ideation  Suicidal Thoughts:  No  Homicidal Thoughts:  No  Memory:  Immediate;   Fair Recent;   Fair Remote;   Fair  Judgement:  Poor  Insight:  Lacking  Psychomotor Activity:  Psychomotor Retardation  Concentration:  Concentration: Fair and Attention Span: Fair  Recall:  [employer] of Knowledge:  Fair  Language:  Fair  Akathisia:  No  Handed:  Right  AIMS (if indicated):     Assets:  Communication Skills Desire for Improvement Housing Physical Health Resilience Social Support  ADL's:  Intact  Cognition:  WNL  Sleep:        Treatment Plan Summary: Daily contact with patient to assess and evaluate symptoms and progress in treatment and Medication management   [name] is a 46 year old female with a history of bipolar disorder admitted for psychotic break in the context of medication noncompliance.  #Mood and psychosis -start Depakote 500 mg TID -start Zyprexa 15 mg nightly  #Insomnia -Trazodone is available  #Metabolic syndrome monitoring -Lipid panel, TSH and HgbA1C are normal -EKG, QTc 438 -pregnancy test is negative  #Disposition -discharge with family -follow up with [facility]     Orson Slick, MD 12/31/2016, 1:02 PM

## 2016-12-31 NOTE — Progress Notes (Signed)
Recreation Therapy Notes  Date: 12.31 .2018  Time: 3:00pm  Location: Craft room  Behavioral response: Appropriate  Group Type: Craft  Participation level: Active  Communication: Patient was social with peers and staff.  Comments: N/A  Britt Theard LRT/CTRS        [name] 12/31/2016 4:02 PM

## 2016-12-31 NOTE — Progress Notes (Signed)
Recreation Therapy Notes  INPATIENT RECREATION THERAPY ASSESSMENT  Patient Details Name: [name] MRN: [id number] DOB: Jul 28, 1970 Today's Date: 12/31/2016  Patient Stressors: Other (Comment)(None)  Coping Skills:   Exercise, Art/Dance, Talking, Music  Personal Challenges: Stress Management  Leisure Interests (2+):  (Read the Holy Bible)  Awareness of Community Resources:  No  Community Resources:     Current Use:    If no, Barriers?:    Patient Strengths:  Strong in spiritual and physical battle  Patient Identified Areas of Improvement:  To be equiped in my recovery  Current Recreation Participation:  To recover  Patient Goal for Hospitalization:  To recover  [place] of Residence:  [place] of Residence:  [place]   Current [place] (including self-harm):  No  Current HI:  No  Consent to Intern Participation: N/A   Apollo Timothy 12/31/2016, 4:28 PM

## 2016-12-31 NOTE — [facility] Group Notes (Signed)
12/31/2016  Time: 1:00PM   Type of Therapy and Topic:  Group Therapy:  Overcoming Obstacles   Participation Level:  None   Description of Group:   In this group patients will be encouraged to explore what they see as obstacles to their own wellness and recovery. They will be guided to discuss their thoughts, feelings, and behaviors related to these obstacles. The group will process together ways to cope with barriers, with attention given to specific choices patients can make. Each patient will be challenged to identify changes they are motivated to make in order to overcome their obstacles. This group will be process-oriented, with patients participating in exploration of their own experiences, giving and receiving support, and processing challenge from other group members.   Therapeutic Goals: 1. Patient will identify personal and current obstacles as they relate to admission. 2. Patient will identify barriers that currently interfere with their wellness or overcoming obstacles.  3. Patient will identify feelings, thought process and behaviors related to these barriers. 4. Patient will identify two changes they are willing to make to overcome these obstacles:      Summary of Patient Progress  Pt attended group but did not participate in group discussions. When prompted by CSW, pt reported, "I am feeling agitated because of other's behaviors." Pt did not identify a long or short term obstacle, and did not contribute to discussion. Pt will continue to work towards her tx goals throughout her admission.   Therapeutic Modalities:   Cognitive Behavioral Therapy Solution Focused Therapy Motivational Interviewing Relapse Prevention Therapy  Alden Hipp, MSW, LCSW 12/31/2016 2:13 PM

## 2016-12-31 NOTE — [facility] Group Notes (Signed)
[facility] Group Notes:  (Nursing/MHT/Case Management/Adjunct)  Date:  12/31/2016  Time:  10:08 PM  Type of Therapy:  Group Therapy  Participation Level:  Active  Participation Quality:  Appropriate  Affect:  Appropriate  Cognitive:  Appropriate  Insight:  Appropriate  Engagement in Group:  Engaged  Modes of Intervention:  Discussion  Summary of Progress/Problems:  [name] 12/31/2016, 10:08 PM

## 2016-12-31 NOTE — Plan of Care (Signed)
Patient is alert and oriented. Patient denies SI, HI  and AVH although patient seems to be responding to internal stimuli. Patient interaction with nurse is brief and guarded. Patient is compliant with medications. Nurse will encourage patient to attend groups. Safety checks will continue QA 15 minutes. Education: Will be free of psychotic symptoms 12/31/2016 1125 - Progressing by Geraldo Docker, RN Knowledge of the prescribed therapeutic regimen will improve 12/31/2016 1125 - Progressing by Geraldo Docker, RN   Education: Knowledge of [place] Education information/materials will improve 12/31/2016 1125 - Progressing by Geraldo Docker, RN   Safety: Ability to remain free from injury will improve 12/31/2016 1125 - Progressing by Geraldo Docker, RN

## 2016-12-31 NOTE — Progress Notes (Signed)
Recreation Therapy Notes  Date: 12.31.2018   Time: 9:30 am   Location: Craft Room   Behavioral response: Appropriate   Intervention Topic: Coping skills   Discussion/Intervention: Group content on today was focused on coping skills. The group defined what coping skills are and when they can be used. Individuals described how they normally cope with thing and the coping skills they normally use. Patients expressed why it is important to cope with things and how not coping with things can affect you. The group participated in the intervention "My coping box" and made coping boxes while adding coping skills they could use in the future to the box. Clinical Observations/Feedback:  Patient came to group late due to unknown reasons. She participated in the intervention and was appropriate during group.   Kallon Caylor LRT/CTRS         Layth Cerezo 12/31/2016 10:27 AM

## 2016-12-31 NOTE — Plan of Care (Signed)
Patient is oriented to unit. Progressing Education: Knowledge of [place] Education information/materials will improve 12/31/2016 1958 - Progressing by Anson [place], RN

## 2016-12-31 NOTE — Tx Team (Signed)
Interdisciplinary Treatment and Diagnostic Plan Update  12/31/2016 Time of Session: 10:50am [name] MRN: [id number]  Principal Diagnosis: Bipolar I disorder, current or most recent episode manic, with psychotic features ([name])  Secondary Diagnoses: Principal Problem:   Bipolar I disorder, current or most recent episode manic, with psychotic features ([name])   Current Medications:  Current Facility-Administered Medications  Medication Dose Route Frequency Provider Last Rate Last Dose  . acetaminophen (TYLENOL) tablet 650 mg  650 mg Oral Q4H PRN [name], NP      . alum & mag hydroxide-simeth (MAALOX/MYLANTA) 200-200-20 MG/5ML suspension 30 mL  30 mL Oral Q6H PRN [name], NP      . divalproex (DEPAKOTE) DR tablet 500 mg  500 mg Oral Q8H [name], [name], MD   500 mg at 12/31/16 1400  . docusate sodium (COLACE) capsule 100 mg  100 mg Oral Q breakfast [name], NP   100 mg at 12/31/16 0851  . ferrous sulfate tablet 325 mg  325 mg Oral Q breakfast [name], NP   325 mg at 12/31/16 3329  . hydrOXYzine (ATARAX/VISTARIL) tablet 25 mg  25 mg Oral TID PRN [name], [name], MD      . magnesium hydroxide (MILK OF MAGNESIA) suspension 30 mL  30 mL Oral Daily PRN [name], NP      . OLANZapine zydis (ZYPREXA) disintegrating tablet 15 mg  15 mg Oral QHS [name], [name], MD   15 mg at 12/30/16 2118  . traZODone (DESYREL) tablet 100 mg  100 mg Oral QHS PRN [name], [name], MD       PTA Medications: No medications prior to admission.    Patient Stressors: Financial difficulties Medication change or noncompliance  Patient Strengths: Ability for insight Average or above average intelligence Capable of independent living Communication skills Supportive family/friends  Treatment Modalities: Medication Management, Group therapy, Case management,  1 to 1 session with clinician, Psychoeducation, Recreational therapy.   Physician  Treatment Plan for Primary Diagnosis: Bipolar I disorder, current or most recent episode manic, with psychotic features ([place]) Long Term Goal(s): Improvement in symptoms so as ready for discharge NA   Short Term Goals: Ability to identify changes in lifestyle to reduce recurrence of condition will improve Ability to verbalize feelings will improve Ability to disclose and discuss suicidal ideas Ability to demonstrate self-control will improve Ability to identify and develop effective coping behaviors will improve Ability to maintain clinical measurements within normal limits will improve Compliance with prescribed medications will improve Ability to identify triggers associated with substance abuse/mental health issues will improve NA  Medication Management: Evaluate patient's response, side effects, and tolerance of medication regimen.  Therapeutic Interventions: 1 to 1 sessions, Unit Group sessions and Medication administration.  Evaluation of Outcomes: Progressing  Physician Treatment Plan for Secondary Diagnosis: Principal Problem:   Bipolar I disorder, current or most recent episode manic, with psychotic features ([name])  Long Term Goal(s): Improvement in symptoms so as ready for discharge NA   Short Term Goals: Ability to identify changes in lifestyle to reduce recurrence of condition will improve Ability to verbalize feelings will improve Ability to disclose and discuss suicidal ideas Ability to demonstrate self-control will improve Ability to identify and develop effective coping behaviors will improve Ability to maintain clinical measurements within normal limits will improve Compliance with prescribed medications will improve Ability to identify triggers associated with substance abuse/mental health issues will improve NA     Medication Management: Evaluate  patient's response, side effects, and tolerance of medication regimen.  Therapeutic Interventions: 1 to 1 sessions,  Unit Group sessions and Medication administration.  Evaluation of Outcomes: Progressing   RN Treatment Plan for Primary Diagnosis: Bipolar I disorder, current or most recent episode manic, with psychotic features ([place]) Long Term Goal(s): Knowledge of disease and therapeutic regimen to maintain health will improve  Short Term Goals: Ability to identify and develop effective coping behaviors will improve and Compliance with prescribed medications will improve  Medication Management: RN will administer medications as ordered by provider, will assess and evaluate patient's response and provide education to patient for prescribed medication. RN will report any adverse and/or side effects to prescribing provider.  Therapeutic Interventions: 1 on 1 counseling sessions, Psychoeducation, Medication administration, Evaluate responses to treatment, Monitor vital signs and CBGs as ordered, Perform/monitor CIWA, COWS, AIMS and Fall Risk screenings as ordered, Perform wound care treatments as ordered.  Evaluation of Outcomes: Progressing   LCSW Treatment Plan for Primary Diagnosis: Bipolar I disorder, current or most recent episode manic, with psychotic features ([name]) Long Term Goal(s): Safe transition to appropriate next level of care at discharge, Engage patient in therapeutic group addressing interpersonal concerns.  Short Term Goals: Engage patient in aftercare planning with referrals and resources, Identify triggers associated with mental health/substance abuse issues and Increase skills for wellness and recovery  Therapeutic Interventions: Assess for all discharge needs, 1 to 1 time with Social worker, Explore available resources and support systems, Assess for adequacy in community support network, Educate family and significant other(s) on suicide prevention, Complete Psychosocial Assessment, Interpersonal group therapy.  Evaluation of Outcomes: Progressing   Progress in Treatment: Attending  groups: Yes. Participating in groups: Yes. Taking medication as prescribed: Yes. Toleration medication: Yes. Family/Significant other contact made: No, will contact:  Pt refused Patient understands diagnosis: Yes. Discussing patient identified problems/goals with staff: Yes. Medical problems stabilized or resolved: Yes. Denies suicidal/homicidal ideation: Yes. Issues/concerns per patient self-inventory: No. Other:    New problem(s) identified: No, Describe:     New Short Term/Long Term Goal(s): To continue to improve and focus on her recovery  Discharge Plan or Barriers: discharge home with family and follow up TBD  Reason for Continuation of Hospitalization: Mania Medication stabilization, psychotic features  Estimated Length of Stay: 3-5 days  Recreational Therapy: Patient Stressors: None. Patient Goal: Patient will engage in interactions with peers and staff in a pro-social manner x5 days.   Attendees: Patient:[name] 12/31/2016 4:44 PM  Physician: Orson Slick, MD 12/31/2016 4:44 PM  Nursing: Elige Radon, RN 12/31/2016 4:44 PM  RN Care Manager: 12/31/2016 4:44 PM  Social Worker: Dossie Arbour, LCSW 12/31/2016 4:44 PM  Recreational Therapist: Roanna Epley, LRT 12/31/2016 4:44 PM  Other:  12/31/2016 4:44 PM  Other:  12/31/2016 4:44 PM  Other: 12/31/2016 4:44 PM    Scribe for Treatment Team: August Saucer, LCSW 12/31/2016 4:44 PM

## 2017-01-01 NOTE — Plan of Care (Signed)
Patient is alert and oriented. Patient denies SI, HI and AVH. Patient continues to be guarded and but is seen more in the milieu. Patient seems to be responding to internal stimuli. Patient interaction with nurse is very brief and avoidant. Nurse will continue to monitor.Safety checks will continue Q 15 minutes. Education: Will be free of psychotic symptoms 01/01/2017 1044 - Progressing by Geraldo Docker, RN Knowledge of the prescribed therapeutic regimen will improve 01/01/2017 1044 - Progressing by Geraldo Docker, RN   Education: Knowledge of [place] Education information/materials will improve 01/01/2017 1044 - Progressing by Geraldo Docker, RN   Safety: Ability to remain free from injury will improve 01/01/2017 1044 - Progressing by Geraldo Docker, RN   Spiritual Needs Ability to function at adequate level 01/01/2017 1044 - Progressing by Geraldo Docker, RN

## 2017-01-01 NOTE — [facility] Group Notes (Signed)
LCSW Group Therapy Note 01/01/2017 9:00am  Type of Therapy and Topic:  Group Therapy:  Setting Goals  Participation Level:  Minimal  Description of Group: In this process group, patients discussed using strengths to work toward goals and address challenges.  Patients identified two positive things about themselves and one goal they were working on.  Patients were given the opportunity to share openly and support each other's plan for self-empowerment.  The group discussed the value of gratitude and were encouraged to have a daily reflection of positive characteristics or circumstances.  Patients were encouraged to identify a plan to utilize their strengths to work on current challenges and goals.  Therapeutic Goals 1. Patient will verbalize personal strengths/positive qualities and relate how these can assist with achieving desired personal goals 2. Patients will verbalize affirmation of peers plans for personal change and goal setting 3. Patients will explore the value of gratitude and positive focus as related to successful achievement of goals 4. Patients will verbalize a plan for regular reinforcement of personal positive qualities and circumstances.  Summary of Patient Progress: Able to meet therapeutic goals listed above, Pt verbalizes concerns about transportation to return home at discharge. CSW explained that [employer] bus had a route here and she would be able to use this transportation home.      Therapeutic Modalities Cognitive Behavioral Therapy Motivational Interviewing    August Saucer, LCSW 01/01/2017 10:36 AM

## 2017-01-01 NOTE — Progress Notes (Signed)
D: Patient denies SI/HI. Patient endorses auditory hallucinations. Patient isolates to room. Is pleasant and compliant with medications. No complaints of pain.  A: Patient was assessed by this nurse. Patient received scheduled medications. Q x 15 minute observation checks were completed for safety. Patient was provided with verbal education on provided medications. Patient care plan was reviewed. Patient was offered support and encouragement. Patient was encourage to attend groups, participate in unit activities and continue with plan of care.   R: Patient adheres with scheduled medication. Patient has no complaints of pain at this time. Patient is receptive to treatment and safety maintained on unit.

## 2017-01-01 NOTE — Progress Notes (Signed)
[facility] MD Progress Note  01/01/2017 12:19 PM [name]  MRN:  [id number]  Subjective:   [name] is in her room under covers. Very limited interaction but pleasant. Slept well in spite of discontinuation of Trazodone as she requested. Tolerates medications well. Still lack of concern about teenage children.  Treatment plan. Continue Zyprexa and Depakote for psychosis and mood stabilization. VPA level in 2-3 days.  Social/disposition. Discharge to home, follow up with [facility]  Principal Problem: Bipolar I disorder, current or most recent episode manic, with psychotic features ([place]) Diagnosis:   Patient Active Problem List   Diagnosis Date Noted  . Bipolar I disorder, current or most recent episode manic, with psychotic features ([place]) [F31.2] 12/28/2016    Priority: High  . Bipolar affective disorder, mixed, severe, with psychotic behavior ([place]) [F31.64] 12/28/2016  . 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  Past Medical History:  Past Medical History:  Diagnosis Date  . Anxiety   . Depression   . Hypertension     Past Surgical History:  Procedure Laterality Date  . CESAREAN SECTION  2002, 2009   Family History:  Family History  Problem Relation Age of Onset  . Breast cancer Mother        Deceased, 49  . Hypertension Mother   . Arthritis Father   . Hypertension Father   . Healthy Son   . Asthma Daughter   . Diabetes type II Daughter    Family Psychiatric  History: none reproted Social History:  Social History   Substance and Sexual Activity  Alcohol Use No     Social History   Substance and Sexual Activity  Drug Use No    Social History   Socioeconomic History  . Marital status: Married    Spouse name: None  . Number of children: None  . Years of education: None  . Highest education level: None  Social Needs  . Financial resource strain: None  .  Food insecurity - worry: None  . Food insecurity - inability: None  . Transportation needs - medical: None  . Transportation needs - non-medical: None  Occupational History  . None  Tobacco Use  . Smoking status: Never Smoker  . Smokeless tobacco: Never Used  Substance and Sexual Activity  . Alcohol use: No  . Drug use: No  . Sexual activity: None  Other Topics Concern  . None  Social History Narrative   She lives at home with 2 sons (63, 92) and 1 daughter (76).   She is currently not working.  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  She moved from [place] in 2013.   Additional Social History:                         Sleep: Fair  Appetite:  Fair  Current Medications: Current Facility-Administered Medications  Medication Dose Route Frequency Provider Last Rate Last Dose  . acetaminophen (TYLENOL) tablet 650 mg  650 mg Oral Q4H PRN [name], NP      . alum & mag hydroxide-simeth (MAALOX/MYLANTA) 200-200-20 MG/5ML suspension 30 mL  30 mL Oral Q6H PRN [name], NP      . divalproex (DEPAKOTE) DR tablet 500 mg  500 mg Oral Q8H Ioana Louks B, MD   500 mg at 01/01/17 0621  . docusate  sodium (COLACE) capsule 100 mg  100 mg Oral Q breakfast [name], NP   100 mg at 01/01/17 0835  . ferrous sulfate tablet 325 mg  325 mg Oral Q breakfast [name], NP   325 mg at 01/01/17 0254  . hydrOXYzine (ATARAX/VISTARIL) tablet 25 mg  25 mg Oral TID PRN Brinley Rosete B, MD      . magnesium hydroxide (MILK OF MAGNESIA) suspension 30 mL  30 mL Oral Daily PRN [name], NP      . OLANZapine zydis (ZYPREXA) disintegrating tablet 15 mg  15 mg Oral QHS Klye Besecker B, MD   15 mg at 12/31/16 2126  . traZODone (DESYREL) tablet 100 mg  100 mg Oral QHS PRN Glennice Marcos B, MD        Lab Results: No results found for this or any previous visit (from the past 48 hour(s)).  Blood Alcohol level:   Lab Results  Component Value Date   ETH <10 12/27/2016   ETH  03/22/2007    <5        LOWEST DETECTABLE LIMIT FOR SERUM ALCOHOL IS 11 mg/dL FOR MEDICAL PURPOSES ONLY    Metabolic Disorder Labs: Lab Results  Component Value Date   HGBA1C 5.1 12/29/2016   MPG 99.67 12/29/2016   No results found for: PROLACTIN Lab Results  Component Value Date   CHOL 147 12/29/2016   TRIG 43 12/29/2016   HDL 54 12/29/2016   CHOLHDL 2.7 12/29/2016   VLDL 9 12/29/2016   LDLCALC 84 12/29/2016    Physical Findings: AIMS: Facial and Oral Movements Muscles of Facial Expression: None, normal Lips and Perioral Area: None, normal Jaw: None, normal Tongue: None, normal,Extremity Movements Upper (arms, wrists, hands, fingers): None, normal Lower (legs, knees, ankles, toes): None, normal, Trunk Movements Neck, shoulders, hips: None, normal, Overall Severity Severity of abnormal movements (highest score from questions above): None, normal Incapacitation due to abnormal movements: None, normal Patient's awareness of abnormal movements (rate only patient's report): No Awareness, Dental Status Current problems with teeth and/or dentures?: No Does patient usually wear dentures?: No  CIWA:    COWS:     Musculoskeletal: Strength & Muscle Tone: within normal limits Gait & Station: normal Patient leans: N/A  Psychiatric Specialty Exam: Physical Exam  Nursing note and vitals reviewed. Psychiatric: Her affect is blunt. Her speech is delayed. She is slowed, withdrawn and actively hallucinating. Thought content is paranoid and delusional. Cognition and memory are normal. She expresses impulsivity.    Review of Systems  Neurological: Negative.   Psychiatric/Behavioral: Positive for hallucinations.  All other systems reviewed and are negative.   Blood pressure 126/78, pulse 77, temperature 98.6 F (37 C), temperature source Oral, resp. rate 18, height 5\' 7"  (1.702 m), weight 72.6 kg (160 lb), SpO2 100  %.Body mass index is 25.06 kg/m.  General Appearance: Casual and Fairly Groomed  Eye Contact:  Poor  Speech:  Clear and Coherent  Volume:  Decreased  Mood:  Depressed  Affect:  Blunt  Thought Process:  Goal Directed and Descriptions of Associations: Intact  Orientation:  Full (Time, Place, and Person)  Thought Content:  Delusions, Hallucinations: Auditory and Paranoid Ideation  Suicidal Thoughts:  No  Homicidal Thoughts:  No  Memory:  Immediate;   Fair Recent;   Fair Remote;   Fair  Judgement:  Poor  Insight:  Lacking  Psychomotor Activity:  Psychomotor Retardation  Concentration:  Concentration: Fair and Attention Span: Fair  Recall:  [place] of Knowledge:  Fair  Language:  Fair  Akathisia:  No  Handed:  Right  AIMS (if indicated):     Assets:  Communication Skills Desire for Improvement Housing Physical Health Resilience Social Support  ADL's:  Intact  Cognition:  WNL  Sleep:  Number of Hours: 6     Treatment Plan Summary: Daily contact with patient to assess and evaluate symptoms and progress in treatment and Medication management   [name] is a 47 year old female with a history of bipolar disorder admitted for psychotic break in the context of medication noncompliance.  #Mood and psychosis -start Depakote 500 mg TID -start Zyprexa 15 mg nightly  #Insomnia -Trazodone is available  #Metabolic syndrome monitoring -Lipid panel, TSH and HgbA1C arenormal -EKG, QTc 438 -pregnancy test is negative  #Disposition -discharge with family -[facility] with [facility]    Orson Slick, MD 01/01/2017, 12:19 PM

## 2017-01-01 NOTE — [facility] Group Notes (Signed)
[place] Group Notes:  (Nursing/MHT/Case Management/Adjunct)  Date:  01/01/2017  Time:  9:09 PM  Type of Therapy:  Evening Wrap-up Group  Participation Level:  Minimal  Participation Quality:  Appropriate  Affect:  Appropriate  Cognitive:  Alert and Appropriate  Insight:  Appropriate and Improving  Engagement in Group:  Developing/Improving  Modes of Intervention:  Activity, Socialization and Support  Summary of Progress/Problems:  [name] 01/01/2017, 9:09 PM

## 2017-01-01 NOTE — Progress Notes (Signed)
Recreation Therapy Notes  Date: 01.01.2019   Time: 9:30 am   Location: Craft Room   Behavioral response: Appropriate   Intervention Topic: Goals   Discussion/Intervention: Group content on today was focused on goals. Patients described what goals are and how they define goals. Individuals expressed how they go about setting goals and reaching them. The group identified how important goals are and if they make short term goals to reach long term goals. Patients described how many goals they work on at a time and what affects them not reaching their goal. Individuals described how much time they put into planning and obtaining their goals. The group participated in the intervention "My Goal Board" and made personal goal boards to help them achieve their goal. Clinical Observations/Feedback:  Patient came to group and was focused on what her peers and staff had to say about the topic at hand. She participated in the intervention.  Shereese Bonnie LRT/CTRS         Chiquitta Matty 01/01/2017 11:21 AM

## 2017-01-01 NOTE — [facility] Group Notes (Signed)
01/01/2017 1PM  Type of Therapy/Topic:  Group Therapy:  Feelings about Diagnosis  Participation Level:  Minimal   Description of Group:   This group will allow patients to explore their thoughts and feelings about diagnoses they have received. Patients will be guided to explore their level of understanding and acceptance of these diagnoses. Facilitator will encourage patients to process their thoughts and feelings about the reactions of others to their diagnosis and will guide patients in identifying ways to discuss their diagnosis with significant others in their lives. This group will be process-oriented, with patients participating in exploration of their own experiences, giving and receiving support, and processing challenge from other group members.   Therapeutic Goals: 1. Patient will demonstrate understanding of diagnosis as evidenced by identifying two or more symptoms of the disorder 2. Patient will be able to express two feelings regarding the diagnosis 3. Patient will demonstrate their ability to communicate their needs through discussion and/or role play  Summary of Patient Progress: Pt attended group but did not participate in group discussions. When promoted by CSW, pt was able to provide appropriate answers, but did not participate otherwise. Pt reported she is, "feeling really good and feeling relaxed." When asked questions about her diagnosis, pt became tearful, and stated she would like to pass. Pt continues to work towards her tx goals.    Therapeutic Modalities:   Cognitive Behavioral Therapy Brief Therapy  Alden Hipp, MSW, LCSW 01/01/2017 2:09 PM

## 2017-01-02 NOTE — Plan of Care (Signed)
Patient slept for Estimated Hours of 6.30; Precautionary checks every 15 minutes for safety maintained, room free of safety hazards, patient sustains no injury or falls during this shift.  

## 2017-01-02 NOTE — Progress Notes (Deleted)
Recreation Therapy Notes  Date: 01.02.2019   Time: 9:30 am   Location: Craft Room   Behavioral response: N/A   Intervention Topic: Anger   Discussion/Intervention: Patient did not attend group. Clinical Observations/Feedback:  Patient did not attend group.  Tareva Leske LRT/CTRS         Zaila Crew 01/02/2017 12:30 PM

## 2017-01-02 NOTE — Progress Notes (Signed)
Patient ID: [name], female   DOB: 1970/06/08, 47 y.o.   MRN: [id number] Mostly in her room except for during medication; short, straight to the point response, poor eye contact, would not answer questions.

## 2017-01-02 NOTE — Progress Notes (Signed)
[facility] MD Progress Note  01/02/2017 12:23 PM [name]  MRN:  [id number]  Subjective:    [name] is still angry and irritable at times. She takes medications and reports no side effects. She remains unconcerned about her children ages 45.17 and 1. She is unwilling to give [place] permission to talk to her family. I do not believe the patient has the capacity to make such decision as she is unable to participate in discharge planning.   Treatment plan. We will continue Zyprexa and Depakote for psychosis and mood stabilization. We will offer injectable antipsychotic.  Social/disposition. She will be discharged to home with family. Follow up at [facility].  Principal Problem: Bipolar I disorder, current or most recent episode manic, with psychotic features ([place]) Diagnosis:   Patient Active Problem List   Diagnosis Date Noted  . Bipolar I disorder, current or most recent episode manic, with psychotic features ([place]) [F31.2] 12/28/2016    Priority: High  . Bipolar affective disorder, mixed, severe, with psychotic behavior ([place]) [F31.64] 12/28/2016  . 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 Anxiety   . Depression   . Hypertension     Past Surgical History:  Procedure Laterality Date  . CESAREAN SECTION  2002, 2009   Family History:  Family History  Problem Relation Age of Onset  . Breast cancer Mother        Deceased, 24  . Hypertension Mother   . Arthritis Father   . Hypertension Father   . Healthy Son   . Asthma Daughter   . Diabetes type II Daughter    Family Psychiatric  History: none reported Social History:  Social History   Substance and Sexual Activity  Alcohol Use No     Social History   Substance and Sexual Activity  Drug Use No    Social History   Socioeconomic History  . Marital status:  Married    Spouse name: None  . Number of children: None  . Years of education: None  . Highest education level: None  Social Needs  . Financial resource strain: None  . Food insecurity - worry: None  . Food insecurity - inability: None  . Transportation needs - medical: None  . Transportation needs - non-medical: None  Occupational History  . None  Tobacco Use  . Smoking status: Never Smoker  . Smokeless tobacco: Never Used  Substance and Sexual Activity  . Alcohol use: No  . Drug use: No  . Sexual activity: None  Other Topics Concern  . None  Social History Narrative   She lives at home with 2 sons (3, 11) and 1 daughter (17).   She is currently not working.  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  She moved from [place] in 2013.   Additional Social History:                         Sleep: Fair  Appetite:  Fair  Current Medications: Current Facility-Administered Medications  Medication Dose Route Frequency Provider Last Rate Last Dose  . acetaminophen (TYLENOL) tablet 650 mg  650 mg Oral Q4H PRN [name], NP      . alum & mag hydroxide-simeth (MAALOX/MYLANTA) 200-200-20 MG/5ML suspension 30 mL  30 mL Oral Q6H  PRN [name], NP      . divalproex (DEPAKOTE) DR tablet 500 mg  500 mg Oral Q8H Amaka Gluth B, MD   500 mg at 01/02/17 0621  . docusate sodium (COLACE) capsule 100 mg  100 mg Oral Q breakfast [name], NP   100 mg at 01/02/17 0917  . ferrous sulfate tablet 325 mg  325 mg Oral Q breakfast [name], NP   325 mg at 01/02/17 3235  . hydrOXYzine (ATARAX/VISTARIL) tablet 25 mg  25 mg Oral TID PRN Peytin Dechert B, MD      . magnesium hydroxide (MILK OF MAGNESIA) suspension 30 mL  30 mL Oral Daily PRN [name], NP      . OLANZapine zydis (ZYPREXA) disintegrating tablet 15 mg  15 mg Oral QHS Julya Alioto B, MD   15 mg at 01/01/17 2133  . traZODone (DESYREL) tablet  100 mg  100 mg Oral QHS PRN Javyn Havlin B, MD        Lab Results: No results found for this or any previous visit (from the past 48 hour(s)).  Blood Alcohol level:  Lab Results  Component Value Date   ETH <10 12/27/2016   ETH  03/22/2007    <5        LOWEST DETECTABLE LIMIT FOR SERUM ALCOHOL IS 11 mg/dL FOR MEDICAL PURPOSES ONLY    Metabolic Disorder Labs: Lab Results  Component Value Date   HGBA1C 5.1 12/29/2016   MPG 99.67 12/29/2016   No results found for: PROLACTIN Lab Results  Component Value Date   CHOL 147 12/29/2016   TRIG 43 12/29/2016   HDL 54 12/29/2016   CHOLHDL 2.7 12/29/2016   VLDL 9 12/29/2016   LDLCALC 84 12/29/2016    Physical Findings: AIMS: Facial and Oral Movements Muscles of Facial Expression: None, normal Lips and Perioral Area: None, normal Jaw: None, normal Tongue: None, normal,Extremity Movements Upper (arms, wrists, hands, fingers): None, normal Lower (legs, knees, ankles, toes): None, normal, Trunk Movements Neck, shoulders, hips: None, normal, Overall Severity Severity of abnormal movements (highest score from questions above): None, normal Incapacitation due to abnormal movements: None, normal Patient's awareness of abnormal movements (rate only patient's report): No Awareness, Dental Status Current problems with teeth and/or dentures?: No Does patient usually wear dentures?: No  CIWA:    COWS:     Musculoskeletal: Strength & Muscle Tone: within normal limits Gait & Station: normal Patient leans: N/A  Psychiatric Specialty Exam: Physical Exam  Nursing note and vitals reviewed. Psychiatric: Her speech is normal. Judgment normal. Her affect is angry. She is withdrawn. Thought content is paranoid and delusional. Cognition and memory are normal.    Review of Systems  Neurological: Negative.   Psychiatric/Behavioral: Positive for hallucinations.  All other systems reviewed and are negative.   Blood pressure 123/83, pulse  (!) 59, temperature 98.3 F (36.8 C), temperature source Oral, resp. rate 18, height 5\' 7"  (1.702 m), weight 72.6 kg (160 lb), SpO2 100 %.Body mass index is 25.06 kg/m.  General Appearance: Casual  Eye Contact:  Fair  Speech:  Clear and Coherent  Volume:  Normal  Mood:  Dysphoric  Affect:  Blunt  Thought Process:  Goal Directed and Descriptions of Associations: Intact  Orientation:  Full (Time, Place, and Person)  Thought Content:  Delusions and Paranoid Ideation  Suicidal Thoughts:  No  Homicidal Thoughts:  No  Memory:  Immediate;   Fair Recent;   Fair Remote;  Fair  Judgement:  Poor  Insight:  Lacking  Psychomotor Activity:  Psychomotor Retardation  Concentration:  Concentration: Fair and Attention Span: Fair  Recall:  [employer] of Knowledge:  Fair  Language:  Fair  Akathisia:  No  Handed:  Right  AIMS (if indicated):     Assets:  Communication Skills Desire for Improvement Financial Resources/Insurance Housing Physical Health Resilience Social Support  ADL's:  Intact  Cognition:  WNL  Sleep:  Number of Hours: 6.3     Treatment Plan Summary: Daily contact with patient to assess and evaluate symptoms and progress in treatment and Medication management   Ms. Prentiss is a 47 year old female with a history of bipolar disorder admitted for psychotic break in the context of medication noncompliance.  #Mood and psychosis -continue Depakote 500 mg TID, level in am -continue Zyprexa 15 mg nightly   #Metabolic syndrome monitoring -Lipid panel, TSH and HgbA1C arenormal -EKG, QTc 438 -pregnancy test is negative  #Disposition -discharge with family -follow up with [facility]       Orson Slick, MD 01/02/2017, 12:23 PM

## 2017-01-02 NOTE — Progress Notes (Signed)
Recreation Therapy Notes  Date: 01.02.2019   Time: 9:30 am   Location: Craft Room   Behavioral response: Appropriate   Intervention Topic: Anger   Discussion/Intervention: Group content on today was focused on anger management. The group defined anger and reasons they become angry. Individuals expressed negative way they have dealt with anger in the past. Patients stated some positive ways they could deal with anger in the future. The group described how anger can affect your health and daily plans. Individuals participated in the intervention "Score your anger" where they had a chance to answer questions about themselves and get a score of their anger.  Clinical Observations/Feedback:  Patient came to group late due to unknown reasons. During the intervention she stated that she gets angry when others spread rumors because it destroys the other person. Individual was quiet and kept to herself during group.  Aurore Redinger LRT/CTRS             Danie Diehl 01/02/2017 11:28 AM

## 2017-01-02 NOTE — [facility] Group Notes (Signed)
  01/02/2017  Time: 1:00PM  Type of Therapy/Topic:  Group Therapy:  Emotion Regulation  Participation Level:  Minimal   Description of Group:    The purpose of this group is to assist patients in learning to regulate negative emotions and experience positive emotions. Patients will be guided to discuss ways in which they have been vulnerable to their negative emotions. These vulnerabilities will be juxtaposed with experiences of positive emotions or situations, and patients will be challenged to use positive emotions to combat negative ones. Special emphasis will be placed on coping with negative emotions in conflict situations, and patients will process healthy conflict resolution skills.  Therapeutic Goals: 1. Patient will identify two positive emotions or experiences to reflect on in order to balance out negative emotions 2. Patient will label two or more emotions that they find the most difficult to experience 3. Patient will demonstrate positive conflict resolution skills through discussion and/or role plays  Summary of Patient Progress: Pt continues to work towards their tx goals but has not yet reached them. Pt was able to appropriately participate in group discussion, and was able to offer support/validation to other group members. Pt participated in group discussion when prompted by CSW. Pt reported, "I don't really let my emotions get out of control. I just ignore people. Before I came in here I had a little trouble, but usually I don't have problems." Pt reported something she can do to stay safe and calm in the moment is, "go for a walk or read the bible."   Therapeutic Modalities:   Cognitive Behavioral Therapy Feelings Identification Dialectical Behavioral Therapy  Alden Hipp, MSW, LCSW 01/02/2017 1:22 PM

## 2017-01-02 NOTE — Progress Notes (Signed)
Received [name] this am after breakfast, she was compliant with her medications although her behavior indicated paranoia. Her remained isolated in her room throughout the day. She denied all of the psychiatric symptoms this am. No change in her status this PM.

## 2017-01-02 NOTE — Progress Notes (Signed)
Recreation Therapy Notes  Date: 01.02.2018  Time: 3:00pm  Location: Craft room  Behavioral response: Appropriate  Group Type: Craft  Participation level: Active  Communication: Patient was social with peers and staff.  Comments: N/A  Jesseka Drinkard LRT/CTRS        Julius Boniface 01/02/2017 4:05 PM

## 2017-01-03 LAB — VALPROIC ACID LEVEL: VALPROIC ACID LVL: 111 ug/mL — AB (ref 50.0–100.0)

## 2017-01-03 MED ORDER — DIVALPROEX SODIUM 500 MG PO DR TAB: 500.0000 mg | DELAYED_RELEASE_TABLET | Freq: Three times a day (TID) | ORAL | Status: DC

## 2017-01-03 MED ORDER — OLANZAPINE 15 MG PO TABS: 15.0000 mg | ORAL_TABLET | Freq: Every day | ORAL | 1 refills | Status: DC

## 2017-01-03 MED ORDER — OLANZAPINE 5 MG PO TABS
15.0000 mg | ORAL_TABLET | Freq: Every day | ORAL | Status: DC
  Administered 2017-01-03 – 2017-01-05 (×3): 15 mg via ORAL
  Filled 2017-01-03 (×3): qty 1

## 2017-01-03 MED ORDER — DOCUSATE SODIUM 100 MG PO CAPS: 100.0000 mg | ORAL_CAPSULE | Freq: Every day | ORAL | 1 refills | Status: DC

## 2017-01-03 MED ORDER — DIVALPROEX SODIUM 250 MG PO DR TAB: 750.0000 mg | DELAYED_RELEASE_TABLET | Freq: Three times a day (TID) | ORAL | 1 refills | Status: DC

## 2017-01-03 MED ORDER — DIVALPROEX SODIUM 500 MG PO DR TAB: 750.0000 mg | DELAYED_RELEASE_TABLET | Freq: Three times a day (TID) | ORAL | Status: DC

## 2017-01-03 MED ORDER — TRAZODONE HCL 100 MG PO TABS: 100.0000 mg | ORAL_TABLET | Freq: Every evening | ORAL | 1 refills | Status: DC | PRN

## 2017-01-03 MED ORDER — FERROUS SULFATE 325 (65 FE) MG PO TABS: 325.0000 mg | ORAL_TABLET | Freq: Every day | ORAL | 3 refills | Status: DC

## 2017-01-03 MED ORDER — DIVALPROEX SODIUM 500 MG PO DR TAB
500.0000 mg | DELAYED_RELEASE_TABLET | Freq: Every day | ORAL | Status: AC
Start: 2017-01-03 — End: 2017-01-03
  Administered 2017-01-03: 500 mg via ORAL
  Filled 2017-01-03: qty 1

## 2017-01-03 NOTE — Plan of Care (Signed)
  Safety: Ability to remain free from injury will improve 01/03/2017 2206 - Progressing by Providence Crosby, RN Note Pt safe on the unit at this time

## 2017-01-03 NOTE — [facility] Group Notes (Signed)
LCSW Group Therapy Note 01/03/2017 9:00 AM  Type of Therapy and Topic:  Group Therapy:  Setting Goals  Participation Level:  Minimal  Description of Group: In this process group, patients discussed using strengths to work toward goals and address challenges.  Patients identified two positive things about themselves and one goal they were working on.  Patients were given the opportunity to share openly and support each other's plan for self-empowerment.  The group discussed the value of gratitude and were encouraged to have a daily reflection of positive characteristics or circumstances.  Patients were encouraged to identify a plan to utilize their strengths to work on current challenges and goals.  Therapeutic Goals 1. Patient will verbalize personal strengths/positive qualities and relate how these can assist with achieving desired personal goals 2. Patients will verbalize affirmation of peers plans for personal change and goal setting 3. Patients will explore the value of gratitude and positive focus as related to successful achievement of goals 4. Patients will verbalize a plan for regular reinforcement of personal positive qualities and circumstances.  Summary of Patient Progress:  [name] did not participate much in process group today only answering questions asked of her by CSW.  [name] stated that she understood how to establish SMART goals was able to give a goal for today which was "be more agreeable".     Therapeutic Modalities Cognitive Behavioral Therapy Motivational Interviewing    Devona Konig, [place] 01/03/2017 5:07 PM

## 2017-01-03 NOTE — Progress Notes (Signed)
Patient ID: [name], female   DOB: [birth year], 47 y.o.   MRN: [id number] Evasive, distant, isolative, withdrawn, flat, sad in affect and mood, denied pain, denied SI/HI/AVH. Requested, received early medications.

## 2017-01-03 NOTE — Progress Notes (Signed)
D- Patient alert and oriented. Patient presents in a pleasant mood on assessment stating that she slept good and has no complaints or concerns at this time. Patient denies signs/symptoms of depression/anxiety. Patient denies SI, HI, AVH, and pain at this time.   Patient's goal for today is "to find out discharge date on my chart" and patient will meet this goal by "understanding some fact of why I'm hospitalize".   A- Scheduled medications administered to patient, per MD orders. Support and encouragement provided.  Routine safety checks conducted every 15 minutes.  Patient informed to notify staff with problems or concerns.  R- No adverse drug reactions noted. Patient contracts for safety at this time. Patient compliant with medications and treatment plan. Patient receptive, calm, and cooperative. Patient interacts well with others on the unit.  Patient remains safe at this time.

## 2017-01-03 NOTE — [facility] Group Notes (Signed)
[place] Group Notes:  (Nursing/MHT/Case Management/Adjunct)  Date:  01/03/2017  Time:  9:41 PM  Type of Therapy:  Group Therapy  Participation Level:  Did Not Attend  Participation Quality:  [name] 01/03/2017, 9:41 PM

## 2017-01-03 NOTE — Progress Notes (Signed)
Patient ID: [name], female   DOB: April 20, 1970, 47 y.o.   MRN: [id number] CSW met with pt to ask if there was anyone that she would consent to that CSW could reach out to as her support person and who could assist with discharge planning.  Pt informed CSW that she did not wish to involve anyone from her family in her life especially her children as she wanted them to concentrate on school.  Pt did give consent for [facility] that would allow CSW to schedule her a discharge follow-up appointment.  CSW received a call from pt's sister-in-law, [name] [phone]).  CSW did not give Marvis any information about pt due to not having a consent, but allowed Marvis to share information with CSW that she felt would give CSW a better understanding of pt.  Marvis shared with CSW that the behavior that pt is currently displaying was not something that is new.  She shared that she feels that pt is in denial of her mental illness which causes her to stop taking her medications.  Pt stopped taking her psychotropic medications in March 2018.  Marvis shared that the pattern if that pt goes into the hospital and stabilizes on her medications and will be "fine" for a few weeks then will stop her medications which often leads to her aggressive behaviors.  Marvis obtained the contact number for the [facility] and shared that she would like CSW for pt's psychiatrist to give pt her home number and ask her to call the number so she can speak with her son.  Marvis also shared that pt's father is in town from [place] caring for her children while she is in the hospital.

## 2017-01-03 NOTE — Progress Notes (Signed)
D: Pt denies SI/HI/AVH. Pt is pleasant and cooperative. Pt very avoidant, but will talk when engaged. Pt stated she wanted to leave to go see her children.   A: Pt was offered support and encouragement. Pt was given scheduled medications. Pt was encourage to attend groups. Q 15 minute checks were done for safety.   R:Pt attends groups and interacts well with peers and staff. Pt is taking medication. Pt receptive to treatment and safety maintained on unit.

## 2017-01-03 NOTE — Progress Notes (Addendum)
[facility] MD Progress Note  01/03/2017 2:37 PM Roberto Hlavaty  MRN:  [id number]  Subjective:   [name] has no complaints. She denies any symptoms of depression, anxiety or psychosis. She now, appropriately, worried about her children who returned to school after holiday break. 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 There are no somatic complaints. Accepts medications and tolerates them well.   Spoke with her sister-in-law [phone] who tells me that as long as the patient is not willing to talk to her children, whom she can call, she is not ready to come home. Following past hospitalization she held a grudge as well because the family had to commit her. I gave the patient a phone number to her son. She will not be discharged until the family confirms that she is at baseline. HOME PHONE # [phone]  Treatment plan. Continue Zyprexa 15 mg nightly and Depakote 500 mg TID. Level tomorrow.   Social/disposition. She will return to home with family. Follow up with [facility].   Principal Problem: Bipolar I disorder, current or most recent episode manic, with psychotic features ([place]) Diagnosis:   Patient Active Problem List   Diagnosis Date Noted  . Bipolar I disorder, current or most recent episode manic, with psychotic features ([place]) [F31.2] 12/28/2016    Priority: High  . Bipolar affective disorder, mixed, severe, with psychotic behavior ([place]) [F31.64] 12/28/2016  . 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 Anxiety   . Depression   . Hypertension     Past Surgical History:  Procedure Laterality Date  . CESAREAN SECTION  2002, 2009   Family History:  Family History  Problem Relation Age of Onset  . Breast cancer Mother        Deceased, 15  . Hypertension Mother   .  Arthritis Father   . Hypertension Father   . Healthy Son   . Asthma Daughter   . Diabetes type II Daughter    Family Psychiatric  History: none reported Social History:  Social History   Substance and Sexual Activity  Alcohol Use No     Social History   Substance and Sexual Activity  Drug Use No    Social History   Socioeconomic History  . Marital status: Married    Spouse name: None  . Number of children: None  . Years of education: None  . Highest education level: None  Social Needs  . Financial resource strain: None  . Food insecurity - worry: None  . Food insecurity - inability: None  . Transportation needs - medical: None  . Transportation needs - non-medical: None  Occupational History  . None  Tobacco Use  . Smoking status: Never Smoker  . Smokeless tobacco: Never Used  Substance and Sexual Activity  . Alcohol use: No  . Drug use: No  . Sexual activity: None  Other Topics Concern  . None  Social History Narrative   She lives at home with 2 sons (66, 75) and 1 daughter (72).   She is currently not working.  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  She moved from [place] in 2013.   Additional Social History:  Sleep: Fair  Appetite:  Fair  Current Medications: Current Facility-Administered Medications  Medication Dose Route Frequency Provider Last Rate Last Dose  . acetaminophen (TYLENOL) tablet 650 mg  650 mg Oral Q4H PRN [name], NP      . alum & mag hydroxide-simeth (MAALOX/MYLANTA) 200-200-20 MG/5ML suspension 30 mL  30 mL Oral Q6H PRN [name], NP      . divalproex (DEPAKOTE) DR tablet 500 mg  500 mg Oral Q8H Karryn Kosinski B, MD   500 mg at 01/03/17 0630  . docusate sodium (COLACE) capsule 100 mg  100 mg Oral Q breakfast [name], NP   100 mg at 01/03/17 0855  . ferrous sulfate tablet 325 mg  325 mg Oral Q breakfast [name], NP   325  mg at 01/03/17 7564  . hydrOXYzine (ATARAX/VISTARIL) tablet 25 mg  25 mg Oral TID PRN Humzah Harty B, MD      . magnesium hydroxide (MILK OF MAGNESIA) suspension 30 mL  30 mL Oral Daily PRN [name], NP      . OLANZapine zydis (ZYPREXA) disintegrating tablet 15 mg  15 mg Oral QHS Malaiyah Achorn B, MD   15 mg at 01/02/17 2035  . traZODone (DESYREL) tablet 100 mg  100 mg Oral QHS PRN Anayia Eugene B, MD        Lab Results: No results found for this or any previous visit (from the past 48 hour(s)).  Blood Alcohol level:  Lab Results  Component Value Date   ETH <10 12/27/2016   ETH  03/22/2007    <5        LOWEST DETECTABLE LIMIT FOR SERUM ALCOHOL IS 11 mg/dL FOR MEDICAL PURPOSES ONLY    Metabolic Disorder Labs: Lab Results  Component Value Date   HGBA1C 5.1 12/29/2016   MPG 99.67 12/29/2016   No results found for: PROLACTIN Lab Results  Component Value Date   CHOL 147 12/29/2016   TRIG 43 12/29/2016   HDL 54 12/29/2016   CHOLHDL 2.7 12/29/2016   VLDL 9 12/29/2016   LDLCALC 84 12/29/2016    Physical Findings: AIMS: Facial and Oral Movements Muscles of Facial Expression: None, normal Lips and Perioral Area: None, normal Jaw: None, normal Tongue: None, normal,Extremity Movements Upper (arms, wrists, hands, fingers): None, normal Lower (legs, knees, ankles, toes): None, normal, Trunk Movements Neck, shoulders, hips: None, normal, Overall Severity Severity of abnormal movements (highest score from questions above): None, normal Incapacitation due to abnormal movements: None, normal Patient's awareness of abnormal movements (rate only patient's report): No Awareness, Dental Status Current problems with teeth and/or dentures?: No Does patient usually wear dentures?: No  CIWA:    COWS:     Musculoskeletal: Strength & Muscle Tone: within normal limits Gait & Station: normal Patient leans: N/A  Psychiatric Specialty Exam: Physical Exam  Nursing  note and vitals reviewed. Psychiatric: She has a normal mood and affect. Her speech is normal and behavior is normal. Judgment and thought content normal. Cognition and memory are normal.    Review of Systems  Neurological: Negative.   Psychiatric/Behavioral: Negative.   All other systems reviewed and are negative.   Blood pressure 121/67, pulse 65, temperature 98.2 F (36.8 C), temperature source Oral, resp. rate 18, height 5\' 7"  (1.702 m), weight 72.6 kg (160 lb), SpO2 100 %.Body mass index is 25.06 kg/m.  General Appearance: Casual  Eye Contact:  Good  Speech:  Clear and Coherent  Volume:  Normal  Mood:  Euthymic  Affect:  Appropriate  Thought Process:  Goal Directed and Descriptions of Associations: Intact  Orientation:  Full (Time, Place, and Person)  Thought Content:  WDL  Suicidal Thoughts:  No  Homicidal Thoughts:  No  Memory:  Immediate;   Fair Recent;   Fair Remote;   Fair  Judgement:  Impaired  Insight:  Shallow  Psychomotor Activity:  Normal  Concentration:  Concentration: Fair and Attention Span: Fair  Recall:  [employer] of Knowledge:  Fair  Language:  Fair  Akathisia:  Negative  Handed:  Right  AIMS (if indicated):     Assets:  Communication Skills Desire for Improvement Housing Physical Health Resilience Social Support  ADL's:  Intact  Cognition:  WNL  Sleep:  Number of Hours: 7.3     Treatment Plan Summary: Daily contact with patient to assess and evaluate symptoms and progress in treatment and Medication management   Ms. Arntz is a 47 year old female with a history of bipolar disorder admitted for psychotic break in the context of medication noncompliance.  #Mood and psychosis -continue Depakote 500 mg TID, level in am -continue Zyprexa 15 mg nightly   #Metabolic syndrome monitoring -Lipid panel, TSH and HgbA1C arenormal -EKG, QTc 438 -pregnancy test is negative  #Disposition -discharge with family -follow up with  [facility]    Orson Slick, MD 01/03/2017, 2:37 PM

## 2017-01-03 NOTE — Progress Notes (Signed)
Recreation Therapy Notes  Date: 01.03.2019  Time: 9:30 am  Location: Craft Room  Behavioral response: Appropriate  Intervention Topic: Values  Discussion/Intervention: Group content today was focused on values. The group identified what values are and where they come from. Individuals expressed some values and how many they have. Patients described how they go about add or removing values. The group described the importance of having values and how they go about using them in daily life. Patient participated in the intervention "My Values" where they were able to pick out values that were important to them and make a visual aide.  Clinical Observations/Feedback:  Patient came to group late due to unknown reasons.Individual listened to what her peers and staff had to say about the topic at hand. She participated in the intervention and is continuing to work on her goals. Taegan Standage LRT/CTRS         Lezly Rumpf 01/03/2017 10:37 AM

## 2017-01-03 NOTE — Plan of Care (Signed)
Patient denies SI/HI/AVH at this time and also denies signs/symptoms of depression/anxiety. Patient verbalizes understanding of prescribed therapeutic/medication regimen without any further questions or concerns at this time. Patient verbalizes understanding of the general information that's been provided to her. Patient states that she slept "good" last night and has remained free from injury thus far. Patient has the ability to function at an adequate level and has demonstrated that thus far on the unit. Patient has participated in unit groups and is safe on the unit at this time.

## 2017-01-03 NOTE — BHH Group Notes (Signed)
01/03/2017 1PM  Type of Therapy/Topic:  Group Therapy:  Balance in Life  Participation Level:  None  Description of Group:   This group will address the concept of balance and how it feels and looks when one is unbalanced. Patients will be encouraged to process areas in their lives that are out of balance and identify reasons for remaining unbalanced. Facilitators will guide patients in utilizing problem-solving interventions to address and correct the stressor making their life unbalanced. Understanding and applying boundaries will be explored and addressed for obtaining and maintaining a balanced life. Patients will be encouraged to explore ways to assertively make their unbalanced needs known to significant others in their lives, using other group members and facilitator for support and feedback.  Therapeutic Goals: 1. Patient will identify two or more emotions or situations they have that consume much of in their lives. 2. Patient will identify signs/triggers that life has become out of balance:  3. Patient will identify two ways to set boundaries in order to achieve balance in their lives:  4. Patient will demonstrate ability to communicate their needs through discussion and/or role plays  Summary of Patient Progress:  Stayed the entire time but did not engage with group member or facilitator.       Therapeutic Modalities:   Cognitive Behavioral Therapy Solution-Focused Therapy Assertiveness Training  Darin Engels, [place]

## 2017-01-03 NOTE — Plan of Care (Signed)
Patient slept for Estimated Hours of 7.30; Precautionary checks every 15 minutes for safety maintained, room free of safety hazards, patient sustains no injury or falls during this shift.

## 2017-01-03 NOTE — BHH Group Notes (Signed)
[place] Group Notes:  (Nursing/MHT/Case Management/Adjunct)  Date:  01/03/2017  Time:  2:20 AM  Type of Therapy:  Psychoeducational Skills  Participation Level:  Did Not Attend   Joretta Bachelor 01/03/2017, 2:20 AM

## 2017-01-04 LAB — AMMONIA: Ammonia: 49 umol/L — ABNORMAL HIGH (ref 9–35)

## 2017-01-04 LAB — VALPROIC ACID LEVEL: VALPROIC ACID LVL: 114 ug/mL — AB (ref 50.0–100.0)

## 2017-01-04 MED ORDER — ARIPIPRAZOLE ER 400 MG IM SRER
400.0000 mg | INTRAMUSCULAR | Status: DC
  Administered 2017-01-04: 400 mg via INTRAMUSCULAR
  Filled 2017-01-04: qty 400

## 2017-01-04 MED ORDER — PALIPERIDONE ER 3 MG PO TB24: 6.0000 mg | ORAL_TABLET | Freq: Every day | ORAL | Status: DC

## 2017-01-04 MED ORDER — DIVALPROEX SODIUM 500 MG PO DR TAB
500.0000 mg | DELAYED_RELEASE_TABLET | Freq: Two times a day (BID) | ORAL | Status: DC
  Administered 2017-01-05 – 2017-01-10 (×11): 500 mg via ORAL
  Filled 2017-01-04 (×11): qty 1

## 2017-01-04 NOTE — Plan of Care (Signed)
Patient up ad lib in the milieu, denies SI/HI. Patient denies AVH but seems to be responding to internal stimuli. Affect flat and does not engage in interaction with peers in the milieu, however patient attends groups with appropriate behavior and interaction. Abilify injection administered in R) deltoid, no adverse reaction noted at this time. Information provided to patient prior to administration. Compliant with meals and medications. Milieu remains safe with q 15 minute checks.

## 2017-01-04 NOTE — BHH Group Notes (Signed)
01/04/2017 1PM  Type of Therapy and Topic:  Group Therapy:  Feelings around Relapse and Recovery  Participation Level:  Active   Description of Group:    Patients in this group will discuss emotions they experience before and after a relapse. They will process how experiencing these feelings, or avoidance of experiencing them, relates to having a relapse. Facilitator will guide patients to explore emotions they have related to recovery. Patients will be encouraged to process which emotions are more powerful. They will be guided to discuss the emotional reaction significant others in their lives may have to patients' relapse or recovery. Patients will be assisted in exploring ways to respond to the emotions of others without this contributing to a relapse.  Therapeutic Goals: 1. Patient will identify two or more emotions that lead to a relapse for them 2. Patient will identify two emotions that result when they relapse 3. Patient will identify two emotions related to recovery 4. Patient will demonstrate ability to communicate their needs through discussion and/or role plays   Summary of Patient Progress:  Actively and appropriately engaged in the group..Patient practiced active listening when interacting with the facilitator and other group members Patient in still in the process of obtaining treatment goals. [name] says that "I felt like people were taking advantage of me. I miss my house and my children. When I leave I will rely on the healing of Jesus Christ."  Therapeutic Modalities:   Cognitive Behavioral Therapy Solution-Focused Therapy Assertiveness Training Relapse Prevention Therapy   Darin Engels, [place] 01/04/2017 1:57 PM

## 2017-01-04 NOTE — BHH Group Notes (Signed)
[place] Group Notes:  (Nursing/MHT/Case Management/Adjunct)  Date:  01/04/2017  Time:  5:08 PM  Type of Therapy:  Psychoeducational Skills  Participation Level:  None  Participation Quality:  Inattentive  Affect:  Flat  Cognitive:  Lacking  Insight:  Limited  Engagement in Group:  Limited  Modes of Intervention:  Discussion  Summary of Progress/Problems:  Alois Cliche 01/04/2017, 5:08 PM

## 2017-01-04 NOTE — Progress Notes (Signed)
Recreation Therapy Notes  Date: 01.04.2019  Time: 9:30 am  Location: Craft Room  Behavioral response: Appropriate  Intervention Topic: Communication  Discussion/Intervention: Group content today was focused on communication. The group defined communication and ways to communicate with others. Individuals stated reason why communication is important and some reasons to communicate with others. Patients expressed if they thought they were good at communicating with others and ways they could improve their communication skills. The group identified important parts of communication and some experiences they have had in the past with communication. The group participated in the intervention "Words in a Bag", where they had a chance to test out their communication skills and identify ways to improve their communication techniques.  Clinical Observations/Feedback:  Patient came to group and define communication as trying to solve problems and involving others. Individual participated in the intervention and continues to make progress toward her goals.  Catalaya Garr LRT/CTRS         Penney Domanski 01/04/2017 10:22 AM

## 2017-01-04 NOTE — Progress Notes (Signed)
[facility] MD Progress Note  01/04/2017 1:37 PM [name]  MRN:  [id number]  Subjective:    [name] is in her room today, rather irritated with me for suggesting that [name] should call her family. Reluctantly, [name] agrees to call but I have to dial the number. [name] speaks quietly with her son. [name] agreed to give her code to her son but does not wish any other family involvement. [name] spends all the time in her room under covers, hallucinating. [name] was observed mumbling in the medication room. [name] admitted that [name] stopped going to [facility] and receiving Abilify maintenna in the spring of 2018.   Treatment plan. We will continue Zyprexa 15 mg nightly. On Depakote 500 mg TID her level was 114 and ammonia 49. We will restart Depakote at a lower dose. We will give Abilify maintena 400 mg injection today.  Social/disposition. Lost her job in May and is in the process of divorce. It is unclear how much mental illness contributed. [name] has involved family but rejects their help upset that they put her in the hospital.   Principal Problem: Bipolar I disorder, current or most recent episode manic, with psychotic features [facility]) Diagnosis:   Patient Active Problem List   Diagnosis Date Noted  . Bipolar I disorder, current or most recent episode manic, with psychotic features ([place]) [F31.2] 12/28/2016    Priority: High  . Bipolar affective disorder, mixed, severe, with psychotic behavior ([place]) [F31.64] 12/28/2016  . 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  Past Medical History:  Past Medical History:  Diagnosis Date  . Anxiety   . Depression   . Hypertension     Past Surgical History:  Procedure Laterality Date  . CESAREAN SECTION  2002, 2009   Family History:  Family History  Problem Relation Age of Onset  . Breast cancer Mother        Deceased, 36  . Hypertension Mother   . Arthritis Father    . Hypertension Father   . Healthy Son   . Asthma Daughter   . Diabetes type II Daughter    Family Psychiatric  History: none reported Social History:  Social History   Substance and Sexual Activity  Alcohol Use No     Social History   Substance and Sexual Activity  Drug Use No    Social History   Socioeconomic History  . Marital status: Married    Spouse name: None  . Number of children: None  . Years of education: None  . Highest education level: None  Social Needs  . Financial resource strain: None  . Food insecurity - worry: None  . Food insecurity - inability: None  . Transportation needs - medical: None  . Transportation needs - non-medical: None  Occupational History  . None  Tobacco Use  . Smoking status: Never Smoker  . Smokeless tobacco: Never Used  Substance and Sexual Activity  . Alcohol use: No  . Drug use: No  . Sexual activity: None  Other Topics Concern  . None  Social History Narrative   [name] lives at home with 2 sons (67, 15) and 1 daughter (89).   [name] is currently not working.  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  [name] moved from [place] in 2013.   Additional Social History:  Sleep: Fair  Appetite:  Fair  Current Medications: Current Facility-Administered Medications  Medication Dose Route Frequency Provider Last Rate Last Dose  . acetaminophen (TYLENOL) tablet 650 mg  650 mg Oral Q4H PRN [name], NP      . alum & mag hydroxide-simeth (MAALOX/MYLANTA) 200-200-20 MG/5ML suspension 30 mL  30 mL Oral Q6H PRN [name], NP      . ARIPiprazole ER SRER 400 mg  400 mg Intramuscular Q28 days [name], [name], MD   400 mg at 01/04/17 1022  . docusate sodium (COLACE) capsule 100 mg  100 mg Oral Q breakfast [name], NP   100 mg at 01/04/17 0750  . ferrous sulfate tablet 325 mg  325 mg Oral Q breakfast [name], NP   325 mg at 01/04/17  0750  . hydrOXYzine (ATARAX/VISTARIL) tablet 25 mg  25 mg Oral TID PRN [name], [name], MD      . magnesium hydroxide (MILK OF MAGNESIA) suspension 30 mL  30 mL Oral Daily PRN [name], NP      . OLANZapine (ZYPREXA) tablet 15 mg  15 mg Oral QHS [name], [name], MD   15 mg at 01/03/17 2054  . traZODone (DESYREL) tablet 100 mg  100 mg Oral QHS PRN [name], [name], MD        Lab Results:  Results for orders placed or performed during the hospital encounter of 12/28/16 (from the past 48 hour(s))  Valproic acid level     Status: Abnormal   Collection Time: 01/03/17  3:25 PM  Result Value Ref Range   Valproic Acid Lvl 111 (H) 50.0 - 100.0 ug/mL    Comment: Performed at [facility], [address] [place], [place] [address]  Valproic acid level     Status: Abnormal   Collection Time: 01/04/17  7:11 AM  Result Value Ref Range   Valproic Acid Lvl 114 (H) 50.0 - 100.0 ug/mL    Comment: Performed at [facility], [address] [place], [place] [address]  Ammonia     Status: Abnormal   Collection Time: 01/04/17  7:11 AM  Result Value Ref Range   Ammonia 49 (H) 9 - 35 umol/L    Comment: Performed at [facility], [address] [place], [place] [address]    Blood Alcohol level:  Lab Results  Component Value Date   ETH <10 12/27/2016   ETH  03/22/2007    <5        LOWEST DETECTABLE LIMIT FOR SERUM ALCOHOL IS 11 mg/dL FOR MEDICAL PURPOSES ONLY    Metabolic Disorder Labs: Lab Results  Component Value Date   HGBA1C 5.1 12/29/2016   MPG 99.67 12/29/2016   No results found for: PROLACTIN Lab Results  Component Value Date   CHOL 147 12/29/2016   TRIG 43 12/29/2016   HDL 54 12/29/2016   CHOLHDL 2.7 12/29/2016   VLDL 9 12/29/2016   LDLCALC 84 12/29/2016    Physical Findings: AIMS: Facial and Oral Movements Muscles of Facial Expression: None, normal Lips and Perioral Area: None, normal Jaw: None, normal Tongue: None,  normal,Extremity Movements Upper (arms, wrists, hands, fingers): None, normal Lower (legs, knees, ankles, toes): None, normal, Trunk Movements Neck, shoulders, hips: None, normal, Overall Severity Severity of abnormal movements (highest score from questions above): None, normal Incapacitation due to abnormal movements: None, normal Patient's awareness of abnormal movements (rate only patient's report): No Awareness, Dental Status Current problems with  teeth and/or dentures?: No Does patient usually wear dentures?: No  CIWA:    COWS:     Musculoskeletal: Strength & Muscle Tone: within normal limits Gait & Station: normal Patient leans: N/A  Psychiatric Specialty Exam: Physical Exam  Nursing note and vitals reviewed. Psychiatric: Her speech is normal. Her affect is angry. [name] is withdrawn and actively hallucinating. Thought content is paranoid and delusional. Cognition and memory are normal. [name] expresses impulsivity.    Review of Systems  Neurological: Negative.   Psychiatric/Behavioral: Positive for hallucinations.  All other systems reviewed and are negative.   Blood pressure 131/76, pulse 70, temperature 98.2 F (36.8 C), temperature source Oral, resp. rate 18, height 5\' 7"  (1.702 m), weight 72.6 kg (160 lb), SpO2 100 %.Body mass index is 25.06 kg/m.  General Appearance: Casual  Eye Contact:  Minimal  Speech:  Clear and Coherent  Volume:  Normal  Mood:  Dysphoric and Irritable  Affect:  Congruent  Thought Process:  Goal Directed and Descriptions of Associations: Intact  Orientation:  Full (Time, Place, and Person)  Thought Content:  Delusions, Hallucinations: Auditory and Paranoid Ideation  Suicidal Thoughts:  No  Homicidal Thoughts:  No  Memory:  Immediate;   Fair Recent;   Fair Remote;   Fair  Judgement:  Poor  Insight:  Lacking  Psychomotor Activity:  Decreased  Concentration:  Concentration: Fair and Attention Span: Fair  Recall:  [employer] of Knowledge:  Fair   Language:  Fair  Akathisia:  No  Handed:  Right  AIMS (if indicated):     Assets:  Communication Skills Desire for Improvement Housing Physical Health Resilience Social Support  ADL's:  Intact  Cognition:  WNL  Sleep:  Number of Hours: 8     Treatment Plan Summary: Daily contact with patient to assess and evaluate symptoms and progress in treatment and Medication management   [name] is a 47 year old female with a history of bipolar disorder admitted for psychotic break in the context of medication noncompliance.  #Mood and psychosis -continue Zyprexa 15 mg nightly -start Abilify 400 mg monthly -lower Depakote to 818 mg BID  #Metabolic syndrome monitoring -Lipid panel, TSH and HgbA1C arenormal -EKG, QTc 438 -pregnancy test is negative  #Disposition -discharge with family -follow up with [facility]    Orson Slick, MD 01/04/2017, 1:37 PM

## 2017-01-04 NOTE — Progress Notes (Signed)
Patient up ad lib in the milieu, denies SI/HI. Patient denies AVH but seems to be responding to internal stimuli. Affect flat and does not engage in interaction with peers in the milieu, however patient attends groups with appropriate behavior and interaction. Abilify injection administered in R) deltoid, no adverse reaction noted at this time. Information provided to patient prior to administration. Compliant with meals and medications. Milieu remains safe with q 15 minute checks.

## 2017-01-04 NOTE — [facility] Group Notes (Signed)
[facility] Group Notes:  (Nursing/MHT/Case Management/Adjunct)  Date:  01/04/2017  Time:  9:25 PM  Type of Therapy:  Group Therapy  Participation Level:  Active  Participation Quality:  Appropriate  Affect:  Appropriate  Cognitive:  Alert  Insight:  Appropriate  Engagement in Group:  Engaged  Modes of Intervention:  Activity  Summary of Progress/Problems:  [name] 01/04/2017, 9:25 PM

## 2017-01-05 NOTE — Plan of Care (Signed)
Patient up ad lib on the unit with a steady gait, minimal interaction with peers noted. When asked about her R) arm pain from the injection that was given yesterday patient replied, "No it doesn't really hurt that much." Patient offered medication for discomfort, medication declined by patient. Patient encouraged to continue to move and manipulate the extremity. Denies SI/HI/AVH, however patient continues to whisper as if she's talking to someone else. Attends group and is compliant with her medication protocol. Milieu remains safe, will continue to monitor.

## 2017-01-05 NOTE — Plan of Care (Signed)
Patient up on the unit. She does isolate to herself. She is pleasant and states she had a good day. She denies si, hi, avh. But is seen at times mumbling under her breath. She does request new scrubs and towels which Probation officer gave her. She is logical with her thoughts. She is med compliant. Safety maintained. Will continue.

## 2017-01-05 NOTE — Plan of Care (Signed)
Patient up on the unit. She complains of pain in her arm where she got an injection. She refused tylenol given and stated she only wanted ice no medication needed. She was given a wash cloth with ice in it. She denies si, hi, avh, but appears guarded and suspicious about her medications.  She is pleasant. Safety maintained will continue to monitor.

## 2017-01-05 NOTE — Progress Notes (Signed)
Patient up ad lib on the unit with a steady gait, minimal interaction with peers noted. When asked about her R) arm pain from the injection that was given yesterday patient replied, "No it doesn't really hurt that much." Patient offered medication for discomfort, medication declined by patient. Patient encouraged to continue to move and manipulate the extremity. Denies SI/HI/AVH, however patient continues to whisper as if she's talking to someone else. Attends group and is compliant with her medication protocol. Milieu remains safe, will continue to monitor.

## 2017-01-05 NOTE — [facility] Group Notes (Signed)
LCSW Group Therapy Note   01/05/2017 1:15pm   Type of Therapy and Topic:  Group Therapy:  Trust and Honesty  Participation Level:  Minimal  Description of Group:    In this group patients will be asked to explore the value of being honest.  Patients will be guided to discuss their thoughts, feelings, and behaviors related to honesty and trusting in others. Patients will process together how trust and honesty relate to forming relationships with peers, family members, and self. Each patient will be challenged to identify and express feelings of being vulnerable. Patients will discuss reasons why people are dishonest and identify alternative outcomes if one was truthful (to self or others). This group will be process-oriented, with patients participating in exploration of their own experiences, giving and receiving support, and processing challenge from other group members.   Therapeutic Goals: 1. Patient will identify why honesty is important to relationships and how honesty overall affects relationships.  2. Patient will identify a situation where they lied or were lied too and the  feelings, thought process, and behaviors surrounding the situation 3. Patient will identify the meaning of being vulnerable, how that feels, and how that correlates to being honest with self and others. 4. Patient will identify situations where they could have told the truth, but instead lied and explain reasons of dishonesty.   Summary of Patient Progress: Pt reports that she is an honest person and has never struggle with being dishonest. She reports that she stays away from people who does not believe her because she has learned to pick and chose her battles.     Therapeutic Modalities:   Cognitive Behavioral Therapy Solution Focused Therapy Motivational Interviewing Brief Therapy  Briston Lax  CUEBAS-COLON, LCSW 01/05/2017 9:37 AM

## 2017-01-05 NOTE — Progress Notes (Signed)
[facility] MD Progress Note  01/05/2017 5:18 PM [name]  MRN:  [id number]  Subjective:      The patient reports that she is feeling better today and is learning coping skills in group. He says her mood has improved but per nursing, she has been somewhat guarded and suspicious of medications. She does still have some right upper extremity pain in her shoulder from the Abilify injection. She however does not want to take any other medications for pain. She denies any auditory or visual hallucinations and does not appear to be responding to internal stimuli. Affect however remains restricted. Mood is depressed but she denies any current active or passive suicidal thoughts.  Past psychiatric history. She was hospitalized "several times". She only recognizes Risperdal and reportedly was on injections at some point. Denies attempting suicide.  Family psychiatric history. Denies any history of mental illness or substance family.  Social history. She lives with her three children in subsidized housing and on food stamps since she lost her job in May. She is not willing to explain how. She is in the process of divorcing her husband since last year. Their relationship was bad long before that. Denies having any family but according to the chart, her sister-in-law was involved and her father traveled here from [place].     Principal Problem: Bipolar I disorder, current or most recent episode manic, with psychotic features ([place]) Diagnosis:   Patient Active Problem List   Diagnosis Date Noted  . Bipolar affective disorder, mixed, severe, with psychotic behavior ([place]) [F31.64] 12/28/2016  . Bipolar I disorder, current or most recent episode manic, with psychotic features ([place]) [F31.2] 12/28/2016  . 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  Past Medical History:  Past Medical History:  Diagnosis  Date  . Anxiety   . Depression   . Hypertension     Past Surgical History:  Procedure Laterality Date  . CESAREAN SECTION  2002, 2009   Family History:  Family History  Problem Relation Age of Onset  . Breast cancer Mother        Deceased, 24  . Hypertension Mother   . Arthritis Father   . Hypertension Father   . Healthy Son   . Asthma Daughter   . Diabetes type II Daughter     Social History:  Social History   Substance and Sexual Activity  Alcohol Use No     Social History   Substance and Sexual Activity  Drug Use No    Social History   Socioeconomic History  . Marital status: Married    Spouse name: None  . Number of children: None  . Years of education: None  . Highest education level: None  Social Needs  . Financial resource strain: None  . Food insecurity - worry: None  . Food insecurity - inability: None  . Transportation needs - medical: None  . Transportation needs - non-medical: None  Occupational History  . None  Tobacco Use  . Smoking status: Never Smoker  . Smokeless tobacco: Never Used  Substance and Sexual Activity  . Alcohol use: No  . Drug use: No  . Sexual activity: None  Other Topics Concern  . None  Social History Narrative   She lives at home with 2 sons (50, 59) and 1 daughter (56).   She is currently not working.  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  She moved from [place] in 2013.    Sleep: Good  Appetite:  Good  Current Medications: Current Facility-Administered Medications  Medication Dose Route Frequency Provider Last Rate Last Dose  . acetaminophen (TYLENOL) tablet 650 mg  650 mg Oral Q4H PRN [name], NP      . alum & mag hydroxide-simeth (MAALOX/MYLANTA) 200-200-20 MG/5ML suspension 30 mL  30 mL Oral Q6H PRN [name], NP      . ARIPiprazole ER SRER 400 mg  400 mg Intramuscular Q28 days [name], [name], MD   400 mg at 01/04/17 1022  . divalproex  (DEPAKOTE) DR tablet 500 mg  500 mg Oral Q12H [name], [name], MD   500 mg at 01/05/17 0847  . docusate sodium (COLACE) capsule 100 mg  100 mg Oral Q breakfast [name], NP   100 mg at 01/05/17 0847  . ferrous sulfate tablet 325 mg  325 mg Oral Q breakfast [name], NP   325 mg at 01/05/17 7893  . hydrOXYzine (ATARAX/VISTARIL) tablet 25 mg  25 mg Oral TID PRN [name], [name], MD      . magnesium hydroxide (MILK OF MAGNESIA) suspension 30 mL  30 mL Oral Daily PRN [name], NP      . OLANZapine (ZYPREXA) tablet 15 mg  15 mg Oral QHS [name], [name], MD   15 mg at 01/04/17 2011  . traZODone (DESYREL) tablet 100 mg  100 mg Oral QHS PRN [name], [name], MD        Lab Results:  Results for orders placed or performed during the hospital encounter of 12/28/16 (from the past 48 hour(s))  Valproic acid level     Status: Abnormal   Collection Time: 01/04/17  7:11 AM  Result Value Ref Range   Valproic Acid Lvl 114 (H) 50.0 - 100.0 ug/mL    Comment: Performed at [facility], [address] [place], [place] [address]  Ammonia     Status: Abnormal   Collection Time: 01/04/17  7:11 AM  Result Value Ref Range   Ammonia 49 (H) 9 - 35 umol/L    Comment: Performed at [facility], [address] [place], [place] [address]    Blood Alcohol level:  Lab Results  Component Value Date   ETH <10 12/27/2016   ETH  03/22/2007    <5        LOWEST DETECTABLE LIMIT FOR SERUM ALCOHOL IS 11 mg/dL FOR MEDICAL PURPOSES ONLY    Metabolic Disorder Labs: Lab Results  Component Value Date   HGBA1C 5.1 12/29/2016   MPG 99.67 12/29/2016   No results found for: PROLACTIN Lab Results  Component Value Date   CHOL 147 12/29/2016   TRIG 43 12/29/2016   HDL 54 12/29/2016   CHOLHDL 2.7 12/29/2016   VLDL 9 12/29/2016   LDLCALC 84 12/29/2016    Physical Findings: AIMS: Facial and Oral Movements Muscles of Facial Expression: None, normal Lips and  Perioral Area: None, normal Jaw: None, normal Tongue: None, normal,Extremity Movements Upper (arms, wrists, hands, fingers): None, normal Lower (legs, knees, ankles, toes): None, normal, Trunk Movements Neck, shoulders, hips: None, normal, Overall Severity Severity of abnormal movements (highest score from questions above): None, normal Incapacitation due to abnormal movements: None, normal Patient's awareness of abnormal movements (rate only patient's report): No Awareness, Dental Status Current problems with teeth and/or dentures?: No Does patient usually wear dentures?: No  Musculoskeletal: Strength & Muscle Tone: within normal limits Gait & Station: normal Patient leans: N/A  Psychiatric Specialty Exam: Physical Exam  Nursing note and vitals reviewed. Psychiatric: Her speech is normal. Her affect is angry. She is withdrawn and actively hallucinating. Thought content is paranoid and delusional. Cognition and memory are normal. She expresses impulsivity.    Review of Systems  Constitutional: Negative.   HENT: Negative.   Eyes: Negative.   Respiratory: Negative.   Cardiovascular: Negative.   Gastrointestinal: Negative.   Genitourinary: Negative.   Musculoskeletal: Negative.   Skin: Negative.   Neurological: Negative.   Endo/Heme/Allergies: Negative.   Psychiatric/Behavioral: Positive for depression. Negative for hallucinations. The patient is nervous/anxious. The patient does not have insomnia.   All other systems reviewed and are negative.   Blood pressure 116/81, pulse 65, temperature 98.7 F (37.1 C), temperature source Oral, resp. rate 18, height 5\' 7"  (1.702 m), weight 72.6 kg (160 lb), SpO2 100 %.Body mass index is 25.06 kg/m.  General Appearance: Casual  Eye Contact:  Minimal  Speech:  Clear and Coherent  Volume:  Normal  Mood:  Dysphoric  Affect:  Restricted  Thought Process:  Goal Directed and Descriptions of Associations: Intact  Orientation:  Full (Time,  Place, and Person)  Thought Content:  No auditory or visual hallucinations. Paranoid thoughts are decreasing  Suicidal Thoughts:  No  Homicidal Thoughts:  No  Memory:  Immediate;   Fair Recent;   Fair Remote;   Fair  Judgement:  Poor  Insight:  Lacking  Psychomotor Activity:  Decreased  Concentration:  Concentration: Fair and Attention Span: Fair  Recall:  [employer] of Knowledge:  Fair  Language:  Fair  Akathisia:  No  Handed:  Right  AIMS (if indicated):     Assets:  Communication Skills Desire for Improvement Housing Physical Health Resilience Social Support  ADL's:  Intact  Cognition:  WNL  Sleep:  Number of Hours: 7.15     Treatment Plan Summary: Daily contact with patient to assess and evaluate symptoms and progress in treatment and Medication management   [name] is a 47 year old female with a history of bipolar disorder admitted for psychotic break in the context of medication noncompliance.  #Mood and psychosis -continue Zyprexa 15 mg nightly -start Abilify 400 mg monthly -lower Depakote to 500 mg BID due to elevated ammonia of 49  #Metabolic syndrome monitoring -Lipid panel, TSH and HgbA1C arenormal -EKG, QTc 438 -pregnancy test is negative  #Disposition -discharge with family -follow up with [facility]    [name], MD 01/05/2017, 5:18 PM

## 2017-01-06 MED ORDER — OLANZAPINE 10 MG PO TABS
20.0000 mg | ORAL_TABLET | Freq: Every day | ORAL | Status: DC
  Administered 2017-01-06 – 2017-01-12 (×7): 20 mg via ORAL
  Filled 2017-01-06 (×7): qty 2

## 2017-01-06 NOTE — Progress Notes (Signed)
[facility] MD Progress Note  01/06/2017 1:53 PM [name]  MRN:  [id number]  Subjective:      The patient was noticed today mumbling to herself under her breath, possibly responding to internal stimuli although she denied any auditory or visual hallucinations. She says she would like to be alone in her room and did not want to interact much with this Probation officer. When asked more questions, she said "it is a spiritual issue". He did become hyperreligious. She denies any current active or passive suicidal thoughts but affect appears depressed although she denies depression. The patient reports having slept well last night and 8 hours were reported by nursing. She has attended some groups but participation is minimal. Appetite is good. She does not interact a lot with staff or peers. She says there is no pain in her right arm currently and just decreased since yesterday. No other new somatic complaints.  Past psychiatric history. She was hospitalized "several times". She only recognizes Risperdal and reportedly was on injections at some point. Denies attempting suicide.  Family psychiatric history. Denies any history of mental illness or substance family.  Social history. She lives with her three children in subsidized housing and on food stamps since she lost her job in May. She is not willing to explain how. She is in the process of divorcing her husband since last year. Their relationship was bad long before that. Denies having any family but according to the chart, her sister-in-law was involved and her father traveled here from [place].     Principal Problem: Bipolar I disorder, current or most recent episode manic, with psychotic features ([place]) Diagnosis:   Patient Active Problem List   Diagnosis Date Noted  . Bipolar affective disorder, mixed, severe, with psychotic behavior ([place]) [F31.64] 12/28/2016  . Bipolar I disorder, current or most recent episode manic, with psychotic features  ([place]) [F31.2] 12/28/2016  . 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  Past Medical History:  Past Medical History:  Diagnosis Date  . Anxiety   . Depression   . Hypertension     Past Surgical History:  Procedure Laterality Date  . CESAREAN SECTION  2002, 2009   Family History:  Family History  Problem Relation Age of Onset  . Breast cancer Mother        Deceased, 93  . Hypertension Mother   . Arthritis Father   . Hypertension Father   . Healthy Son   . Asthma Daughter   . Diabetes type II Daughter     Social History:  Social History   Substance and Sexual Activity  Alcohol Use No     Social History   Substance and Sexual Activity  Drug Use No    Social History   Socioeconomic History  . Marital status: Married    Spouse name: None  . Number of children: None  . Years of education: None  . Highest education level: None  Social Needs  . Financial resource strain: None  . Food insecurity - worry: None  . Food insecurity - inability: None  . Transportation needs - medical: None  . Transportation needs - non-medical: None  Occupational History  . None  Tobacco Use  . Smoking status: Never Smoker  . Smokeless tobacco: Never Used  Substance and Sexual Activity  . Alcohol use: No  . Drug use: No  . Sexual activity: None  Other Topics Concern  .  None  Social History Narrative   She lives at home with 2 sons (68, 54) and 1 daughter (81).   She is currently not working.  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  She moved from [place] in 2013.    Sleep: Good  Appetite:  Good  Current Medications: Current Facility-Administered Medications  Medication Dose Route Frequency Provider Last Rate Last Dose  . acetaminophen (TYLENOL) tablet 650 mg  650 mg Oral Q4H PRN [name], NP      . alum & mag hydroxide-simeth  (MAALOX/MYLANTA) 200-200-20 MG/5ML suspension 30 mL  30 mL Oral Q6H PRN [name], NP      . ARIPiprazole ER SRER 400 mg  400 mg Intramuscular Q28 days [name], [name], MD   400 mg at 01/04/17 1022  . divalproex (DEPAKOTE) DR tablet 500 mg  500 mg Oral Q12H [name], [name], MD   500 mg at 01/06/17 0801  . docusate sodium (COLACE) capsule 100 mg  100 mg Oral Q breakfast [name], NP   100 mg at 01/06/17 0801  . ferrous sulfate tablet 325 mg  325 mg Oral Q breakfast [name], NP   325 mg at 01/06/17 0801  . hydrOXYzine (ATARAX/VISTARIL) tablet 25 mg  25 mg Oral TID PRN [name], [name], MD      . magnesium hydroxide (MILK OF MAGNESIA) suspension 30 mL  30 mL Oral Daily PRN Lord, Asa Saunas, NP      . OLANZapine (ZYPREXA) tablet 20 mg  20 mg Oral QHS [name], MD      . traZODone (DESYREL) tablet 100 mg  100 mg Oral QHS PRN [name], [name], MD        Lab Results:  No results found for this or any previous visit (from the past 48 hour(s)).  Blood Alcohol level:  Lab Results  Component Value Date   ETH <10 12/27/2016   ETH  03/22/2007    <5        LOWEST DETECTABLE LIMIT FOR SERUM ALCOHOL IS 11 mg/dL FOR MEDICAL PURPOSES ONLY    Metabolic Disorder Labs: Lab Results  Component Value Date   HGBA1C 5.1 12/29/2016   MPG 99.67 12/29/2016   No results found for: PROLACTIN Lab Results  Component Value Date   CHOL 147 12/29/2016   TRIG 43 12/29/2016   HDL 54 12/29/2016   CHOLHDL 2.7 12/29/2016   VLDL 9 12/29/2016   LDLCALC 84 12/29/2016    Physical Findings: AIMS: Facial and Oral Movements Muscles of Facial Expression: None, normal Lips and Perioral Area: None, normal Jaw: None, normal Tongue: None, normal,Extremity Movements Upper (arms, wrists, hands, fingers): None, normal Lower (legs, knees, ankles, toes): None, normal, Trunk Movements Neck, shoulders, hips: None, normal, Overall Severity Severity of abnormal movements (highest  score from questions above): None, normal Incapacitation due to abnormal movements: None, normal Patient's awareness of abnormal movements (rate only patient's report): No Awareness, Dental Status Current problems with teeth and/or dentures?: No Does patient usually wear dentures?: No   Musculoskeletal: Strength & Muscle Tone: within normal limits Gait & Station: normal Patient leans: N/A  Psychiatric Specialty Exam: Physical Exam  Nursing note and vitals reviewed. Psychiatric: Her speech is normal. Her affect is angry. She is withdrawn and actively hallucinating. Thought content is paranoid and delusional. Cognition and memory are normal. She expresses impulsivity.    Review of Systems  Constitutional: Negative.  HENT: Negative.   Eyes: Negative.   Respiratory: Negative.   Cardiovascular: Negative.   Gastrointestinal: Negative.   Genitourinary: Negative.   Musculoskeletal:       Right shoulder pain decreased  Skin: Negative.   Neurological: Negative.   Endo/Heme/Allergies: Negative.   Psychiatric/Behavioral: Positive for depression. Negative for hallucinations. The patient is nervous/anxious. The patient does not have insomnia.     Blood pressure 129/83, pulse 72, temperature 98.7 F (37.1 C), temperature source Oral, resp. rate 18, height 5\' 7"  (1.702 m), weight 72.6 kg (160 lb), SpO2 100 %.Body mass index is 25.06 kg/m.  General Appearance: Casual  Eye Contact:  Minimal  Speech:  Clear and Coherent  Volume:  Normal  Mood:  Dysphoric  Affect:  Restricted  Thought Process:  Goal Directed and Descriptions of Associations: Intact, hyperreligious   Orientation:  Full (Time, Place, and Person)  Thought Content:  No auditory or visual hallucinations. Paranoid thoughts are decreasing  Suicidal Thoughts:  No  Homicidal Thoughts:  No  Memory:  Immediate;   Fair Recent;   Fair Remote;   Fair  Judgement:  Poor  Insight:  Lacking  Psychomotor Activity:  Decreased   Concentration:  Concentration: Fair and Attention Span: Fair  Recall:  [employer] of Knowledge:  Fair  Language:  Fair  Akathisia:  No  Handed:  Right  AIMS (if indicated):     Assets:  Communication Skills Desire for Improvement Housing Physical Health Resilience Social Support  ADL's:  Intact  Cognition:  WNL  Sleep:  Number of Hours: 8     Treatment Plan Summary: Daily contact with patient to assess and evaluate symptoms and progress in treatment and Medication management   Ms. Harnack is a 47 year old female with a history of bipolar disorder admitted for psychotic break in the context of medication noncompliance.  #Mood and psychosis -Increase Zyprexa to a total of 20 mg by mouth nightly for psychosis. -Started Abilify 400 mg monthly -lower Depakote to 500 mg BID due to elevated ammonia of 49  #Metabolic syndrome monitoring -Lipid panel, TSH and HgbA1C arenormal -EKG, QTc 438 -pregnancy test is negative  #Disposition -discharge with family -follow up with [facility]    [name], MD 01/06/2017, 1:53 PM

## 2017-01-06 NOTE — Plan of Care (Signed)
  Progressing Education: Will be free of psychotic symptoms 01/06/2017 1009 - Progressing by Jacinto Halim, RN Knowledge of the prescribed therapeutic regimen will improve 01/06/2017 1009 - Progressing by Jacinto Halim, RN Education: Knowledge of [place] Education information/materials will improve 01/06/2017 1009 - Progressing by Jacinto Halim, RN Safety: Ability to remain free from injury will improve 01/06/2017 1009 - Progressing by Jacinto Halim, RN Spiritual Needs Ability to function at adequate level 01/06/2017 1009 - Progressing by Jacinto Halim, RN [facility] Participation in Recreation Therapeutic Interventions [facility]-Other Recreation Therapy Goal (Specify) Description Patient will engage in interactions with peers and staff in a pro-social manner x5 days.   01/06/2017 1009 - Progressing by Jacinto Halim, RN Activity: Sleeping patterns will improve 01/06/2017 1009 - Progressing by Jacinto Halim, RN

## 2017-01-06 NOTE — [facility] Group Notes (Signed)
LCSW Group Therapy Note 01/06/2017 1:15pm  Type of Therapy and Topic: Group Therapy: Feelings Around Returning Home & Establishing a Supportive Framework and Supporting Oneself When Supports Not Available  Participation Level: None  Description of Group:  Patients first processed thoughts and feelings about upcoming discharge. These included fears of upcoming changes, lack of change, new living environments, judgements and expectations from others and overall stigma of mental health issues. The group then discussed the definition of a supportive framework, what that looks and feels like, and how do to discern it from an unhealthy non-supportive network. The group identified different types of supports as well as what to do when your family/friends are less than helpful or unavailable  Therapeutic Goals  1. Patient will identify one healthy supportive network that they can use at discharge. 2. Patient will identify one factor of a supportive framework and how to tell it from an unhealthy network. 3. Patient able to identify one coping skill to use when they do not have positive supports from others. 4. Patient will demonstrate ability to communicate their needs through discussion and/or role plays.  Summary of Patient Progress:  Pt attended group but did not participate.  Therapeutic Modalities Cognitive Behavioral Therapy Motivational Interviewing   Lonisha Bobby  CUEBAS-COLON, LCSW 01/06/2017 9:57 AM

## 2017-01-06 NOTE — [facility] Group Notes (Signed)
[facility] Group Notes:  (Nursing/MHT/Case Management/Adjunct)  Date:  01/06/2017  Time:  11:10 PM  Type of Therapy:  Group Therapy  Participation Level:  Active  Participation Quality:  Appropriate  Affect:  Appropriate  Cognitive:  Appropriate  Insight:  Appropriate  Engagement in Group:  Engaged  Modes of Intervention:  Discussion  Summary of Progress/Problems:  [name] 01/06/2017, 11:10 PM

## 2017-01-06 NOTE — Progress Notes (Signed)
Patient out of room for meals and medications. Patient isolates to self, does not interact with staff or peers. Denies SI, HI, AVH. Request sanitary napkins. Logical thoughts.  Encouragement and support provided, safety checks maintained. Medications given as prescribed. Patient receptive and remains safe on unit with q 15 min checks.

## 2017-01-07 NOTE — Plan of Care (Signed)
Safety maintained in the unit.

## 2017-01-07 NOTE — Progress Notes (Signed)
Patient isolative , she does not interact with staff or peers. She denies SI, HI, AVH. Logical  when engaged in conversation. Medications given as prescribed and she is compliant with medications. She appears to be resting in bed quietly at this time with eyes closed.

## 2017-01-07 NOTE — Progress Notes (Signed)
[facility] MD Progress Note  01/07/2017 2:01 PM [name]  MRN:  [id number]  Subjective:   [name] remains irritable, psychotic, disorganized and suspicious. She keeps mumbling under her breath but claoms to meditate and recite the bible. She presented me with her writings instead of verbal answers. They are incomprehensible.   Treatment plan. We will continue Zyprexa and depakote in addition to Abilify maintena injection. WE consider switching to Haldol.   Principal Problem: Bipolar I disorder, current or most recent episode manic, with psychotic features ([place]) Diagnosis:   Patient Active Problem List   Diagnosis Date Noted  . Bipolar I disorder, current or most recent episode manic, with psychotic features ([place]) [F31.2] 12/28/2016    Priority: High  . Bipolar affective disorder, mixed, severe, with psychotic behavior ([place]) [F31.64] 12/28/2016  . 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 Anxiety   . Depression   . Hypertension     Past Surgical History:  Procedure Laterality Date  . CESAREAN SECTION  2002, 2009   Family History:  Family History  Problem Relation Age of Onset  . Breast cancer Mother        Deceased, 64  . Hypertension Mother   . Arthritis Father   . Hypertension Father   . Healthy Son   . Asthma Daughter   . Diabetes type II Daughter    Family Psychiatric  History: none reported Social History:  Social History   Substance and Sexual Activity  Alcohol Use No     Social History   Substance and Sexual Activity  Drug Use No    Social History   Socioeconomic History  . Marital status: Married    Spouse name: None  . Number of children: None  . Years of education: None  . Highest education level: None  Social Needs  . Financial resource strain: None  . Food insecurity - worry:  None  . Food insecurity - inability: None  . Transportation needs - medical: None  . Transportation needs - non-medical: None  Occupational History  . None  Tobacco Use  . Smoking status: Never Smoker  . Smokeless tobacco: Never Used  Substance and Sexual Activity  . Alcohol use: No  . Drug use: No  . Sexual activity: None  Other Topics Concern  . None  Social History Narrative   She lives at home with 2 sons (62, 33) and 1 daughter (42).   She is currently not working.  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  She moved from [place] in 2013.   Additional Social History:                         Sleep: Fair  Appetite:  Fair  Current Medications: Current Facility-Administered Medications  Medication Dose Route Frequency Provider Last Rate Last Dose  . acetaminophen (TYLENOL) tablet 650 mg  650 mg Oral Q4H PRN [name], NP      . alum & mag hydroxide-simeth (MAALOX/MYLANTA) 200-200-20 MG/5ML suspension 30 mL  30 mL Oral Q6H PRN [name], NP      . ARIPiprazole ER SRER 400 mg  400 mg Intramuscular Q28 days Kaleia Longhi B, MD   400 mg at 01/04/17 1022  . divalproex (DEPAKOTE) DR tablet  500 mg  500 mg Oral Q12H Dericka Ostenson B, MD   500 mg at 01/07/17 0802  . docusate sodium (COLACE) capsule 100 mg  100 mg Oral Q breakfast [name], NP   100 mg at 01/07/17 0802  . ferrous sulfate tablet 325 mg  325 mg Oral Q breakfast [name], NP   325 mg at 01/07/17 0802  . hydrOXYzine (ATARAX/VISTARIL) tablet 25 mg  25 mg Oral TID PRN Jaree Dwight B, MD      . magnesium hydroxide (MILK OF MAGNESIA) suspension 30 mL  30 mL Oral Daily PRN [name], NP      . OLANZapine (ZYPREXA) tablet 20 mg  20 mg Oral QHS [name], MD   20 mg at 01/06/17 2036  . traZODone (DESYREL) tablet 100 mg  100 mg Oral QHS PRN Trinika Cortese B, MD        Lab Results: No results found for this or any  previous visit (from the past 48 hour(s)).  Blood Alcohol level:  Lab Results  Component Value Date   ETH <10 12/27/2016   ETH  03/22/2007    <5        LOWEST DETECTABLE LIMIT FOR SERUM ALCOHOL IS 11 mg/dL FOR MEDICAL PURPOSES ONLY    Metabolic Disorder Labs: Lab Results  Component Value Date   HGBA1C 5.1 12/29/2016   MPG 99.67 12/29/2016   No results found for: PROLACTIN Lab Results  Component Value Date   CHOL 147 12/29/2016   TRIG 43 12/29/2016   HDL 54 12/29/2016   CHOLHDL 2.7 12/29/2016   VLDL 9 12/29/2016   LDLCALC 84 12/29/2016    Physical Findings: AIMS: Facial and Oral Movements Muscles of Facial Expression: None, normal Lips and Perioral Area: None, normal Jaw: None, normal Tongue: None, normal,Extremity Movements Upper (arms, wrists, hands, fingers): None, normal Lower (legs, knees, ankles, toes): None, normal, Trunk Movements Neck, shoulders, hips: None, normal, Overall Severity Severity of abnormal movements (highest score from questions above): None, normal Incapacitation due to abnormal movements: None, normal Patient's awareness of abnormal movements (rate only patient's report): No Awareness, Dental Status Current problems with teeth and/or dentures?: No Does patient usually wear dentures?: No  CIWA:    COWS:     Musculoskeletal: Strength & Muscle Tone: within normal limits Gait & Station: normal Patient leans: N/A  Psychiatric Specialty Exam: Physical Exam  Nursing note and vitals reviewed. Psychiatric: Her affect is labile. Her speech is tangential. She is actively hallucinating. Thought content is paranoid. Cognition and memory are normal. She expresses impulsivity.    Review of Systems  Neurological: Negative.   Psychiatric/Behavioral: Positive for hallucinations.  All other systems reviewed and are negative.   Blood pressure 127/83, pulse 85, temperature 98.2 F (36.8 C), temperature source Oral, resp. rate 18, height 5\' 7"  (1.702  m), weight 72.6 kg (160 lb), SpO2 100 %.Body mass index is 25.06 kg/m.  General Appearance: Casual  Eye Contact:  Fair  Speech:  Clear and Coherent  Volume:  Normal  Mood:  Dysphoric and Irritable  Affect:  Congruent  Thought Process:  Disorganized and Descriptions of Associations: Loose  Orientation:  Full (Time, Place, and Person)  Thought Content:  Delusions, Hallucinations: Auditory and Paranoid Ideation  Suicidal Thoughts:  No  Homicidal Thoughts:  No  Memory:  Immediate;   Fair Recent;   Fair Remote;   Fair  Judgement:  Poor  Insight:  Lacking  Psychomotor Activity:  Normal  Concentration:  Concentration: Fair and Attention Span: Fair  Recall:  [employer] of Knowledge:  Fair  Language:  Fair  Akathisia:  No  Handed:  Right  AIMS (if indicated):     Assets:  Communication Skills Desire for Improvement Housing Physical Health Resilience Social Support  ADL's:  Intact  Cognition:  WNL  Sleep:  Number of Hours: 7.75     Treatment Plan Summary: Daily contact with patient to assess and evaluate symptoms and progress in treatment and Medication management   Ms. Cottone is a 47 year old female with a history of bipolar disorder admitted for psychotic break in the context of medication noncompliance.  #Mood and psychosis -Increase Zyprexa to a total of 20 mg by mouth nightly for psychosis. -Started Abilify 400 mg monthly -lower Depakote to 500 mg BID due to elevated ammonia of 49  #Metabolic syndrome monitoring -Lipid panel, TSH and HgbA1C arenormal -EKG, QTc 438 -pregnancy test is negative  #Disposition -discharge with family -follow up with [facility]    Orson Slick, MD 01/07/2017, 2:01 PM

## 2017-01-07 NOTE — Progress Notes (Signed)
Recreation Therapy Notes  Date: 01.07.2019  Time: 9:30 am  Location: Craft Room  Behavioral response: Appropriate  Intervention Topic: Problem Solving  Discussion/Intervention: Group content on today was focused on problem solving. The group described what problem solving is. Patients expressed how problems affect them and how they deal with problems. Individuals identified healthy ways to deal with problems. Patients explained what normally happens to them when they do not deal with problems. The group expressed reoccurring problems for them. The group participated in the intervention "Put the story together" with their peers and worked together to put a story that was broken up in the correct order. Clinical Observations/Feedback:  Patient came to group and define problem solving as two heads joined together to get a solution. She stated she uses problem solving when there is a situation. Individual described that the main reason to problem solve is to figure out the issue. She participated in the intervention and was social with peers and staff during group.  Tramon Crescenzo LRT/CTRS         Waylen Depaolo 01/07/2017 10:25 AM
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
## 2017-01-07 NOTE — Progress Notes (Signed)
Patient is guarded and reluctant to talk to staff.Patient was talking to herself in the bathroom.Patient states " that's my child."When staff asked patient states "I am talking to my God Almighty."Compliant with medications.Attended groups.Appetite and energy level good.Support and encouragement given.

## 2017-01-07 NOTE — Tx Team (Signed)
Interdisciplinary Treatment and Diagnostic Plan Update  01/07/2017 Time of Session: 10:50am [name] MRN: [id number]  Principal Diagnosis: Bipolar I disorder, current or most recent episode manic, with psychotic features ([place])  Secondary Diagnoses: Principal Problem:   Bipolar I disorder, current or most recent episode manic, with psychotic features ([place])   Current Medications:  Current Facility-Administered Medications  Medication Dose Route Frequency Provider Last Rate Last Dose  . acetaminophen (TYLENOL) tablet 650 mg  650 mg Oral Q4H PRN [name], NP      . alum & mag hydroxide-simeth (MAALOX/MYLANTA) 200-200-20 MG/5ML suspension 30 mL  30 mL Oral Q6H PRN [name], NP      . ARIPiprazole ER SRER 400 mg  400 mg Intramuscular Q28 days [name], [name], MD   400 mg at 01/04/17 1022  . divalproex (DEPAKOTE) DR tablet 500 mg  500 mg Oral Q12H [name], [name], MD   500 mg at 01/07/17 0802  . docusate sodium (COLACE) capsule 100 mg  100 mg Oral Q breakfast [name], NP   100 mg at 01/07/17 0802  . ferrous sulfate tablet 325 mg  325 mg Oral Q breakfast [name], NP   325 mg at 01/07/17 0802  . hydrOXYzine (ATARAX/VISTARIL) tablet 25 mg  25 mg Oral TID PRN [name], [name], MD      . magnesium hydroxide (MILK OF MAGNESIA) suspension 30 mL  30 mL Oral Daily PRN [name], NP      . OLANZapine (ZYPREXA) tablet 20 mg  20 mg Oral QHS [name], MD   20 mg at 01/06/17 2036  . traZODone (DESYREL) tablet 100 mg  100 mg Oral QHS PRN [name], [name], MD       PTA Medications: No medications prior to admission.    Patient Stressors: Financial difficulties Medication change or noncompliance  Patient Strengths: Ability for insight Average or above average intelligence Capable of independent living Communication skills Supportive family/friends  Treatment Modalities: Medication Management, Group therapy, Case management,   1 to 1 session with clinician, Psychoeducation, Recreational therapy.   Physician Treatment Plan for Primary Diagnosis: Bipolar I disorder, current or most recent episode manic, with psychotic features ([name]) Long Term Goal(s): Improvement in symptoms so as ready for discharge NA   Short Term Goals: Ability to identify changes in lifestyle to reduce recurrence of condition will improve Ability to verbalize feelings will improve Ability to disclose and discuss suicidal ideas Ability to demonstrate self-control will improve Ability to identify and develop effective coping behaviors will improve Ability to maintain clinical measurements within normal limits will improve Compliance with prescribed medications will improve Ability to identify triggers associated with substance abuse/mental health issues will improve NA  Medication Management: Evaluate patient's response, side effects, and tolerance of medication regimen.  Therapeutic Interventions: 1 to 1 sessions, Unit Group sessions and Medication administration.  Evaluation of Outcomes: Progressing  Physician Treatment Plan for Secondary Diagnosis: Principal Problem:   Bipolar I disorder, current or most recent episode manic, with psychotic features ([name])  Long Term Goal(s): Improvement in symptoms so as ready for discharge NA   Short Term Goals: Ability to identify changes in lifestyle to reduce recurrence of condition will improve Ability to verbalize feelings will improve Ability to disclose and discuss suicidal ideas Ability to demonstrate self-control will improve Ability to identify and develop effective coping behaviors will improve Ability to maintain clinical measurements within normal limits will improve Compliance with prescribed medications  will improve Ability to identify triggers associated with substance abuse/mental health issues will improve NA     Medication Management: Evaluate patient's response, side  effects, and tolerance of medication regimen.  Therapeutic Interventions: 1 to 1 sessions, Unit Group sessions and Medication administration.  Evaluation of Outcomes: Progressing   RN Treatment Plan for Primary Diagnosis: Bipolar I disorder, current or most recent episode manic, with psychotic features ([name]) Long Term Goal(s): Knowledge of disease and therapeutic regimen to maintain health will improve  Short Term Goals: Ability to identify and develop effective coping behaviors will improve and Compliance with prescribed medications will improve  Medication Management: RN will administer medications as ordered by provider, will assess and evaluate patient's response and provide education to patient for prescribed medication. RN will report any adverse and/or side effects to prescribing provider.  Therapeutic Interventions: 1 on 1 counseling sessions, Psychoeducation, Medication administration, Evaluate responses to treatment, Monitor vital signs and CBGs as ordered, Perform/monitor CIWA, COWS, AIMS and Fall Risk screenings as ordered, Perform wound care treatments as ordered.  Evaluation of Outcomes: Progressing   LCSW Treatment Plan for Primary Diagnosis: Bipolar I disorder, current or most recent episode manic, with psychotic features ([name]) Long Term Goal(s): Safe transition to appropriate next level of care at discharge, Engage patient in therapeutic group addressing interpersonal concerns.  Short Term Goals: Engage patient in aftercare planning with referrals and resources, Identify triggers associated with mental health/substance abuse issues and Increase skills for wellness and recovery  Therapeutic Interventions: Assess for all discharge needs, 1 to 1 time with Social worker, Explore available resources and support systems, Assess for adequacy in community support network, Educate family and significant other(s) on suicide prevention, Complete Psychosocial Assessment, Interpersonal  group therapy.  Evaluation of Outcomes: Progressing   Progress in Treatment: Attending groups: Yes. Participating in groups: Yes. Taking medication as prescribed: Yes. Toleration medication: Yes. Family/Significant other contact made: No, will contact:  Pt refused Patient understands diagnosis: Yes. Discussing patient identified problems/goals with staff: Yes. Medical problems stabilized or resolved: Yes. Denies suicidal/homicidal ideation: Yes. Issues/concerns per patient self-inventory: No. Other:    New problem(s) identified: No, Describe:     New Short Term/Long Term Goal(s): To continue to improve and focus on her recovery  Discharge Plan or Barriers: discharge home with family and follow up TBD  Reason for Continuation of Hospitalization: Mania Medication stabilization, psychotic features-religiously preoccupied, paranoia  Estimated Length of Stay: 3-5 days  Recreational Therapy: Patient Stressors: None. Patient Goal: Patient will engage in interactions with peers and staff in a pro-social manner x5 days.   Attendees: Patient:[name] 01/07/2017 3:22 PM  Physician: Orson Slick, MD 01/07/2017 3:22 PM  Nursing: Elige Radon, RN 01/07/2017 3:22 PM  RN Care Manager: 01/07/2017 3:22 PM  Social Worker: Dossie Arbour, LCSW 01/07/2017 3:22 PM  Recreational Therapist: Roanna Epley, LRT 01/07/2017 3:22 PM  Other:  01/07/2017 3:22 PM  Other:  01/07/2017 3:22 PM  Other: 01/07/2017 3:22 PM    Scribe for Treatment Team: August Saucer, LCSW 01/07/2017 3:22 PM

## 2017-01-08 NOTE — Progress Notes (Signed)
[facility] MD Progress Note  01/08/2017 2:22 PM [name]  MRN:  [id number]  Subjective:   [name] has been trying to convince me that she is good to be discharged. Indeed, she is not suicidal, homicidal, agitated. She accepts medications, including Abilify injection, and reports no side effects. She however is still actively and loudly hallucinating, is terribly disorganized and religiously preoccupied. She tells me today that her divorce with custody of 3 children and [place]ings is to be dicussed in court on 1/14. She has no lawyer and will go to court "with God". She has absolutely no support in the community since her father returned to [place]. It is unclear if her 3 children have any money or food in the house. She became tearful, defensive and then mute when discussing her children's situation. In response to some of my questions, she presented me with written answers that were incomprehensible.  Treatment plan. We continue Depakote, level in am, Abilify monthly injections and Zyprexa.  Social/disposition. She will return home with family. Follow up wt [facility].  Principal Problem: Bipolar I disorder, current or most recent episode manic, with psychotic features ([place]) Diagnosis:   Patient Active Problem List   Diagnosis Date Noted  . Bipolar I disorder, current or most recent episode manic, with psychotic features ([place]) [F31.2] 12/28/2016    Priority: High  . Bipolar affective disorder, mixed, severe, with psychotic behavior ([place]) [F31.64] 12/28/2016  . 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 Anxiety   . Depression   . Hypertension     Past Surgical History:  Procedure Laterality Date  . CESAREAN SECTION  2002, 2009   Family History:  Family History  Problem Relation Age of Onset  . Breast cancer Mother    Deceased, 21  . Hypertension Mother   . Arthritis Father   . Hypertension Father   . Healthy Son   . Asthma Daughter   . Diabetes type II Daughter    Family Psychiatric  History: none reported Social History:  Social History   Substance and Sexual Activity  Alcohol Use No     Social History   Substance and Sexual Activity  Drug Use No    Social History   Socioeconomic History  . Marital status: Married    Spouse name: None  . Number of children: None  . Years of education: None  . Highest education level: None  Social Needs  . Financial resource strain: None  . Food insecurity - worry: None  . Food insecurity - inability: None  . Transportation needs - medical: None  . Transportation needs - non-medical: None  Occupational History  . None  Tobacco Use  . Smoking status: Never Smoker  . Smokeless tobacco: Never Used  Substance and Sexual Activity  . Alcohol use: No  . Drug use: No  . Sexual activity: None  Other Topics Concern  . None  Social History Narrative   She lives at home with 2 sons (50, 35) and 1 daughter (30).   She is currently not working.  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  She moved from [place] in 2013.   Additional Social History:  Sleep: Fair  Appetite:  Fair  Current Medications: Current Facility-Administered Medications  Medication Dose Route Frequency Provider Last Rate Last Dose  . acetaminophen (TYLENOL) tablet 650 mg  650 mg Oral Q4H PRN [name], NP      . alum & mag hydroxide-simeth (MAALOX/MYLANTA) 200-200-20 MG/5ML suspension 30 mL  30 mL Oral Q6H PRN [name], NP      . ARIPiprazole ER SRER 400 mg  400 mg Intramuscular Q28 days Josselyn Harkins B, MD   400 mg at 01/04/17 1022  . divalproex (DEPAKOTE) DR tablet 500 mg  500 mg Oral Q12H Trai Ells B, MD   500 mg at 01/08/17 0807  . docusate sodium (COLACE) capsule 100 mg   100 mg Oral Q breakfast [name], NP   100 mg at 01/08/17 2542  . ferrous sulfate tablet 325 mg  325 mg Oral Q breakfast [name], NP   325 mg at 01/08/17 7062  . hydrOXYzine (ATARAX/VISTARIL) tablet 25 mg  25 mg Oral TID PRN Paulanthony Gleaves B, MD      . magnesium hydroxide (MILK OF MAGNESIA) suspension 30 mL  30 mL Oral Daily PRN [name], NP      . OLANZapine (ZYPREXA) tablet 20 mg  20 mg Oral QHS [name], MD   20 mg at 01/07/17 2059  . traZODone (DESYREL) tablet 100 mg  100 mg Oral QHS PRN Daniya Aramburo B, MD        Lab Results: No results found for this or any previous visit (from the past 48 hour(s)).  Blood Alcohol level:  Lab Results  Component Value Date   ETH <10 12/27/2016   ETH  03/22/2007    <5        LOWEST DETECTABLE LIMIT FOR SERUM ALCOHOL IS 11 mg/dL FOR MEDICAL PURPOSES ONLY    Metabolic Disorder Labs: Lab Results  Component Value Date   HGBA1C 5.1 12/29/2016   MPG 99.67 12/29/2016   No results found for: PROLACTIN Lab Results  Component Value Date   CHOL 147 12/29/2016   TRIG 43 12/29/2016   HDL 54 12/29/2016   CHOLHDL 2.7 12/29/2016   VLDL 9 12/29/2016   LDLCALC 84 12/29/2016    Physical Findings: AIMS: Facial and Oral Movements Muscles of Facial Expression: None, normal Lips and Perioral Area: None, normal Jaw: None, normal Tongue: None, normal,Extremity Movements Upper (arms, wrists, hands, fingers): None, normal Lower (legs, knees, ankles, toes): None, normal, Trunk Movements Neck, shoulders, hips: None, normal, Overall Severity Severity of abnormal movements (highest score from questions above): None, normal Incapacitation due to abnormal movements: None, normal Patient's awareness of abnormal movements (rate only patient's report): No Awareness, Dental Status Current problems with teeth and/or dentures?: No Does patient usually wear dentures?: No  CIWA:    COWS:     Musculoskeletal: Strength &  Muscle Tone: within normal limits Gait & Station: normal Patient leans: N/A  Psychiatric Specialty Exam: Physical Exam  Nursing note and vitals reviewed. Psychiatric: Her affect is labile. Her speech is delayed. She is actively hallucinating. Thought content is paranoid and delusional. Cognition and memory are impaired. She expresses impulsivity.    Review of Systems  Neurological: Negative.   Psychiatric/Behavioral: Positive for hallucinations.  All other systems reviewed and are negative.   Blood pressure (!) 123/108, pulse 75, temperature 97.8 F (36.6 C), temperature source Oral, resp. rate 20, height 5\' 7"  (1.702 m), weight 72.6 kg (160 lb), SpO2 100 %.  Body mass index is 25.06 kg/m.  General Appearance: Casual  Eye Contact:  Good  Speech:  Blocked and Clear and Coherent  Volume:  Normal  Mood:  Anxious  Affect:  Tearful  Thought Process:  Disorganized, Irrelevant and Descriptions of Associations: Loose  Orientation:  Full (Time, Place, and Person)  Thought Content:  Delusions, Hallucinations: Auditory and Paranoid Ideation  Suicidal Thoughts:  No  Homicidal Thoughts:  No  Memory:  Immediate;   Fair Recent;   Fair Remote;   Fair  Judgement:  Poor  Insight:  Lacking  Psychomotor Activity:  Normal  Concentration:  Concentration: Poor and Attention Span: Poor  Recall:  Poor  Fund of Knowledge:  Fair  Language:  Fair  Akathisia:  No  Handed:  Right  AIMS (if indicated):     Assets:  Communication Skills Desire for Improvement Physical Health Resilience Social Support  ADL's:  Intact  Cognition:  WNL  Sleep:  Number of Hours: 7     Treatment Plan Summary: Daily contact with patient to assess and evaluate symptoms and progress in treatment and Medication management   [name] is a 47 year old female with a history of bipolar disorder admitted for psychotic break in the context of medication noncompliance.  #Mood and psychosis -Increase Zyprexa to a total of  20 mg by mouth nightly for psychosis. -StartedAbilify 400 mg monthly -lower Depakote to 500 mg BID due to elevated ammonia of 49  #Metabolic syndrome monitoring -Lipid panel, TSH and HgbA1C arenormal -EKG, QTc 438 -pregnancy test is negative  #Disposition -discharge with family -follow up with [facility]    Orson Slick, MD 01/08/2017, 2:22 PM

## 2017-01-08 NOTE — Progress Notes (Signed)
Recreation Therapy Notes  Date: 01.08.2019  Time: 9:30 am  Location: Craft Room  Behavioral response: Appropriate  Intervention Topic: Team work  Discussion/Intervention: Group content on today was focused on teamwork. The group identified what teamwork is. Individuals described who is a part of their team. Patients expressed why they thought teamwork is important. The group stated reasons why they thought it was easier to work with a Dance movement psychotherapist team. Individuals discussed some positives and negatives of working with a team. Patients gave examples of past experiences they had while working with a team. The group participated in the intervention "Save the Bowdens", patients were in groups and had to keep the balls on the surface given.  Clinical Observations/Feedback:  Patient came to group and defined teamwork as employees working in a company to bring about success. She stated team work has to have unity. Individual described that when working as a team others may have skills you do not which can help. Patient stated that obedience is better than sacrifice.Indivudal participated in the intervention and continues to work towards her goal.  Pharmacist, community Daana Petrasek LRT/CTRS         Allyiah Gartner 01/08/2017 10:35 AM

## 2017-01-08 NOTE — Progress Notes (Signed)
Patient isolated in the room most of the time.Minimal interactions with staff & peers.Patient is sad and tearful when asked about the kids.Denies SI,HI and AVH.Patient is talking to herself at times.Attended groups.Compliant with medications.Appetite and energy level good.Support and encouragement given.

## 2017-01-08 NOTE — Plan of Care (Signed)
Patient participating in group activities.

## 2017-01-08 NOTE — Progress Notes (Addendum)
Patient found in bed resting upon my arrival. Requests and is given hygiene products. Denies SI, HI, AVH, depression, and anxiety. Reports eating, sleeping, and voiding adequately. Cooperative with assessment. Compliant with HS medications. Visible and social in the evening. Ate snack and attended group. Denies pain and other complaints. Q 15 minute checks maintained. Will continue to monitor throughout the shift.   Patient slept 7 hours. Compliant with am vitals. VSS. Will endorse care to oncoming shift.

## 2017-01-08 NOTE — BHH Group Notes (Signed)
01/08/2017 1PM  Type of Therapy/Topic:  Group Therapy:  Feelings about Diagnosis  Participation Level:  Active   Description of Group:   This group will allow patients to explore their thoughts and feelings about diagnoses they have received. Patients will be guided to explore their level of understanding and acceptance of these diagnoses. Facilitator will encourage patients to process their thoughts and feelings about the reactions of others to their diagnosis and will guide patients in identifying ways to discuss their diagnosis with significant others in their lives. This group will be process-oriented, with patients participating in exploration of their own experiences, giving and receiving support, and processing challenge from other group members.   Therapeutic Goals: 1. Patient will demonstrate understanding of diagnosis as evidenced by identifying two or more symptoms of the disorder 2. Patient will be able to express two feelings regarding the diagnosis 3. Patient will demonstrate their ability to communicate their needs through discussion and/or role play  Summary of Patient Progress:  Actively and appropriately engaged in the group. Patient was able to provide support and validation to other group members.Patient practiced active listening when interacting with the facilitator and other group members Patient in still in the process of obtaining treatment goals. Metzli says "I am feeling good because of the spirits of the most high God. I have 3 children to take care of when I get out of here and I plan on attending all church events."    Therapeutic Modalities:   Cognitive Behavioral Therapy Brief Therapy Feelings Identification    Darin Engels, [place] 01/08/2017 2:02 PM

## 2017-01-08 NOTE — Plan of Care (Signed)
  Progressing Education: Will be free of psychotic symptoms 01/08/2017 0002 - Progressing by Derek Mound, RN Knowledge of the prescribed therapeutic regimen will improve 01/08/2017 0002 - Progressing by Derek Mound, RN Education: Knowledge of [employer] Education information/materials will improve 01/08/2017 0002 - Progressing by Derek Mound, RN Safety: Ability to remain free from injury will improve 01/08/2017 0002 - Progressing by Derek Mound, RN Spiritual Needs Ability to function at adequate level 01/08/2017 0002 - Progressing by Derek Mound, RN [facility] Participation in Recreation Therapeutic Interventions STG-Other Recreation Therapy Goal (Specify) Description Patient will engage in interactions with peers and staff in a pro-social manner x5 days.   01/08/2017 0002 - Progressing by Derek Mound, RN Activity: Sleeping patterns will improve 01/08/2017 0002 - Progressing by Derek Mound, RN

## 2017-01-09 LAB — AMMONIA: Ammonia: 52 umol/L — ABNORMAL HIGH (ref 9–35)

## 2017-01-09 LAB — VALPROIC ACID LEVEL: Valproic Acid Lvl: 94 ug/mL (ref 50.0–100.0)

## 2017-01-09 NOTE — BHH Group Notes (Signed)
01/09/2017 1PM  Type of Therapy/Topic:  Group Therapy:  Emotion Regulation  Participation Level:  Active   Description of Group:   The purpose of this group is to assist patients in learning to regulate negative emotions and experience positive emotions. Patients will be guided to discuss ways in which they have been vulnerable to their negative emotions. These vulnerabilities will be juxtaposed with experiences of positive emotions or situations, and patients will be challenged to use positive emotions to combat negative ones. Special emphasis will be placed on coping with negative emotions in conflict situations, and patients will process healthy conflict resolution skills.  Therapeutic Goals: 1. Patient will identify two positive emotions or experiences to reflect on in order to balance out negative emotions 2. Patient will label two or more emotions that they find the most difficult to experience 3. Patient will demonstrate positive conflict resolution skills through discussion and/or role plays  Summary of Patient Progress: Actively and appropriately engaged in the group. Patient was able to provide support and validation to other group members.Patient practiced active listening when interacting with the facilitator and other group members Patient in still in the process of obtaining treatment goals. [name] reports that being "overwhelmed" is a emotion that she experiences on a regular basis. She states "it can either be good or bad, depending on the situation."      Therapeutic Modalities:   Cognitive Behavioral Therapy Feelings Identification Dialectical Behavioral Therapy   Darin Engels, LCSW 01/09/2017 1:52 PM

## 2017-01-09 NOTE — Progress Notes (Signed)
[facility] MD Progress Note  01/09/2017 6:19 PM [name]  MRN:  [id number]  Subjective:  [name] is still psychotic, hyperreligious and disorganized. She is unable to engage with peers or staff, or participate in discharge planning. She does not wish any family contact. She talks to her son on the phone.  Treatment plan. Continue current regimen of Abilify injections, zyprexa 20 mg and depakote 500 mg BID for psychosis and mood stabilization. VPA level is therapeutic, ammonia slightly elevated.  Social/disposition. Discharge to home with family, CPS is investigating. Follow up with [facility].  Principal Problem: Bipolar I disorder, current or most recent episode manic, with psychotic features ([place]) Diagnosis:   Patient Active Problem List   Diagnosis Date Noted  . Bipolar I disorder, current or most recent episode manic, with psychotic features ([place]) [F31.2] 12/28/2016    Priority: High  . Bipolar affective disorder, mixed, severe, with psychotic behavior ([place]) [F31.64] 12/28/2016  . 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 Anxiety   . Depression   . Hypertension     Past Surgical History:  Procedure Laterality Date  . CESAREAN SECTION  2002, 2009   Family History:  Family History  Problem Relation Age of Onset  . Breast cancer Mother        Deceased, 10  . Hypertension Mother   . Arthritis Father   . Hypertension Father   . Healthy Son   . Asthma Daughter   . Diabetes type II Daughter    Family Psychiatric  History: none reported Social History:  Social History   Substance and Sexual Activity  Alcohol Use No     Social History   Substance and Sexual Activity  Drug Use No    Social History   Socioeconomic History  . Marital status: Married    Spouse name: None  . Number of children: None  .  Years of education: None  . Highest education level: None  Social Needs  . Financial resource strain: None  . Food insecurity - worry: None  . Food insecurity - inability: None  . Transportation needs - medical: None  . Transportation needs - non-medical: None  Occupational History  . None  Tobacco Use  . Smoking status: Never Smoker  . Smokeless tobacco: Never Used  Substance and Sexual Activity  . Alcohol use: No  . Drug use: No  . Sexual activity: None  Other Topics Concern  . None  Social History Narrative   She lives at home with 2 sons (7, 65) and 1 daughter (40).   She is currently not working.  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  She moved from [place] in 2013.   Additional Social History:                         Sleep: Fair  Appetite:  Fair  Current Medications: Current Facility-Administered Medications  Medication Dose Route Frequency Provider Last Rate Last Dose  . acetaminophen (TYLENOL) tablet 650 mg  650 mg Oral Q4H PRN [name], NP      . alum & mag hydroxide-simeth (MAALOX/MYLANTA) 200-200-20 MG/5ML suspension 30 mL  30 mL Oral Q6H PRN [name], NP      . ARIPiprazole ER SRER 400 mg  400 mg Intramuscular Q28 days Shaquill Iseman B, MD   400 mg at 01/04/17 1022  . divalproex (DEPAKOTE) DR tablet 500 mg  500 mg Oral Q12H Dhyana Bastone B, MD   500 mg at 01/09/17 0806  . docusate sodium (COLACE) capsule 100 mg  100 mg Oral Q breakfast [name], NP   100 mg at 01/09/17 0806  . ferrous sulfate tablet 325 mg  325 mg Oral Q breakfast [name], NP   325 mg at 01/09/17 2778  . hydrOXYzine (ATARAX/VISTARIL) tablet 25 mg  25 mg Oral TID PRN Ponce Skillman B, MD      . magnesium hydroxide (MILK OF MAGNESIA) suspension 30 mL  30 mL Oral Daily PRN [name], NP      . OLANZapine (ZYPREXA) tablet 20 mg  20 mg Oral QHS [name], MD   20 mg at 01/08/17 2118  .  traZODone (DESYREL) tablet 100 mg  100 mg Oral QHS PRN Danijela Vessey B, MD        Lab Results:  Results for orders placed or performed during the hospital encounter of 12/28/16 (from the past 48 hour(s))  Valproic acid level     Status: None   Collection Time: 01/09/17  7:41 AM  Result Value Ref Range   Valproic Acid Lvl 94 50.0 - 100.0 ug/mL    Comment: Performed at [facility], [address] [place], [place] [address]  Ammonia     Status: Abnormal   Collection Time: 01/09/17  7:41 AM  Result Value Ref Range   Ammonia 52 (H) 9 - 35 umol/L    Comment: Performed at [facility], [address] [place], [place] [address]    Blood Alcohol level:  Lab Results  Component Value Date   ETH <10 12/27/2016   ETH  03/22/2007    <5        LOWEST DETECTABLE LIMIT FOR SERUM ALCOHOL IS 11 mg/dL FOR MEDICAL PURPOSES ONLY    Metabolic Disorder Labs: Lab Results  Component Value Date   HGBA1C 5.1 12/29/2016   MPG 99.67 12/29/2016   No results found for: PROLACTIN Lab Results  Component Value Date   CHOL 147 12/29/2016   TRIG 43 12/29/2016   HDL 54 12/29/2016   CHOLHDL 2.7 12/29/2016   VLDL 9 12/29/2016   LDLCALC 84 12/29/2016    Physical Findings: AIMS: Facial and Oral Movements Muscles of Facial Expression: None, normal Lips and Perioral Area: None, normal Jaw: None, normal Tongue: None, normal,Extremity Movements Upper (arms, wrists, hands, fingers): None, normal Lower (legs, knees, ankles, toes): None, normal, Trunk Movements Neck, shoulders, hips: None, normal, Overall Severity Severity of abnormal movements (highest score from questions above): None, normal Incapacitation due to abnormal movements: None, normal Patient's awareness of abnormal movements (rate only patient's report): No Awareness, Dental Status Current problems with teeth and/or dentures?: No Does patient usually wear dentures?: No  CIWA:    COWS:      Musculoskeletal: Strength & Muscle Tone: within normal limits Gait & Station: normal Patient leans: N/A  Psychiatric Specialty Exam: Physical Exam  Nursing note and vitals reviewed. Psychiatric: Her affect is blunt. Her speech is delayed. She is slowed, withdrawn and actively hallucinating. Thought content is paranoid and delusional. Cognition and memory are normal. She expresses impulsivity.    Review of Systems  Neurological: Negative.   Psychiatric/Behavioral: Positive for hallucinations.  All other systems reviewed and are negative.   Blood pressure Marland Kitchen)  139/96, pulse 75, temperature 98.5 F (36.9 C), temperature source Oral, resp. rate 20, height 5\' 7"  (1.702 m), weight 72.6 kg (160 lb), SpO2 100 %.Body mass index is 25.06 kg/m.  General Appearance: Casual  Eye Contact:  Good  Speech:  Clear and Coherent  Volume:  Normal  Mood:  Anxious, Dysphoric and Irritable  Affect:  Inappropriate  Thought Process:  Disorganized and Descriptions of Associations: Tangential  Orientation:  Full (Time, Place, and Person)  Thought Content:  Delusions and Paranoid Ideation  Suicidal Thoughts:  No  Homicidal Thoughts:  No  Memory:  Immediate;   Fair Recent;   Fair Remote;   Fair  Judgement:  Poor  Insight:  Lacking  Psychomotor Activity:  Decreased  Concentration:  Concentration: Fair and Attention Span: Fair  Recall:  [employer] of Knowledge:  Fair  Language:  Fair  Akathisia:  No  Handed:  Right  AIMS (if indicated):     Assets:  Communication Skills Desire for Improvement Housing Physical Health Resilience Social Support  ADL's:  Intact  Cognition:  WNL  Sleep:  Number of Hours: 6.45     Treatment Plan Summary: Daily contact with patient to assess and evaluate symptoms and progress in treatment and Medication management   [name] is a 47 year old female with a history of bipolar disorder admitted for psychotic break in the context of medication  noncompliance.  #Mood and psychosis -continue Zyprexa 20 mg nightly  -continue Abilify mintena injections, next on 02/01/2017 -continue Depakote 500 mg BID, VPA level on 1/9 is 94, Ammonia 52  #Metabolic syndrome monitoring -Lipid panel, TSH and HgbA1C arenormal -EKG, QTc 438 -pregnancy test is negative  #Social -patient continues to refuse family contact -attempts to reach her son have failed -SW made CPS referral as we are unsure if her minor kids are taken care of -patient has divorce case in court on 2/14, she has no lawyer and is not ready to appear in court  #Disposition -discharge with family -follow up with [facility]     Orson Slick, MD 01/09/2017, 6:19 PM

## 2017-01-09 NOTE — Progress Notes (Signed)
Patient ID: [name], female   DOB: 11-01-1970, 47 y.o.   MRN: [id number] Visible in the milieu watching TV with peers but no direct engagement or interactions; she is polite but would not engage in a long conversation, medication compliant, denied SI/SIB/AVH.

## 2017-01-09 NOTE — Plan of Care (Signed)
Patient slept for Estimated Hours of 6.45; Precautionary checks every 15 minutes for safety maintained, room free of safety hazards, patient sustains no injury or falls during this shift.

## 2017-01-09 NOTE — BHH Group Notes (Signed)
[place] Group Notes:  (Nursing/MHT/Case Management/Adjunct)  Date:  01/09/2017  Time:  11:44 PM  Type of Therapy:  Group Therapy  Participation Level:  Active  Participation Quality:  Appropriate  Affect:  Appropriate  Cognitive:  Alert  Insight:  Good  Engagement in Group:  Engaged  Modes of Intervention:  Support  Summary of Progress/Problems:  [name] 01/09/2017, 11:44 PM

## 2017-01-09 NOTE — BHH Group Notes (Signed)
[facility] Group Notes:  (Nursing/MHT/Case Management/Adjunct)  Date:  01/09/2017  Time:  3:23 PM  Type of Therapy:  Psychoeducational Skills  Participation Level:  Active  Participation Quality:  Appropriate  Affect:  Appropriate  Cognitive:  Appropriate  Insight:  Appropriate  Engagement in Group:  Developing/Improving  Modes of Intervention:  Discussion and Education  Summary of Progress/Problems:  [name] 01/09/2017, 3:23 PM

## 2017-01-09 NOTE — Plan of Care (Signed)
Patient is alert and oriented. Patient denies SI, HI and AVH. Patient attended groups today and was out in the milieu more, however did not interact with peers. Patient will sit far away from other peers in the day rooms. Patient is pleasant and compliant with medications. Patient voices no concerns. Safety checks will continue Q 15 minutes. Education: Will be free of psychotic symptoms 01/09/2017 1314 - Progressing by Geraldo Docker, RN Knowledge of the prescribed therapeutic regimen will improve 01/09/2017 1314 - Progressing by Geraldo Docker, RN   Education: Knowledge of [facility] Education information/materials will improve 01/09/2017 1314 - Progressing by Geraldo Docker, RN   Safety: Ability to remain free from injury will improve 01/09/2017 1314 - Progressing by Geraldo Docker, RN

## 2017-01-09 NOTE — Progress Notes (Signed)
Recreation Therapy Notes  Date: 01.09.2019  Time: 9:30 am  Location: Craft Room  Behavioral response: Appropriate  Intervention Topic: Creative Expressions  Discussion/Intervention: Group content on today was focused on creative expressions. The group defined creative expressions and ways they use creative expressions. Individual identified other positive ways creative expressions can be used and why it is important to express yourself. Patients participated in the intervention "learning origami", where they had a chance to learn new ways to creatively express themselves. Clinical Observations/Feedback:  Patient came to group and was focused on what her peers and staff had to say about the topic at hand. She stated the she sometimes stays quiet when it is time to express herself. Individual participated in the intervention and continues to work toward her goal.  Pharmacist, community Winna Golla LRT/CTRS         Patsy Zaragoza 01/09/2017 11:26 AM

## 2017-01-10 MED ORDER — DIVALPROEX SODIUM 250 MG PO DR TAB
250.0000 mg | DELAYED_RELEASE_TABLET | Freq: Two times a day (BID) | ORAL | Status: DC
  Administered 2017-01-10 – 2017-01-18 (×16): 250 mg via ORAL
  Filled 2017-01-10 (×16): qty 1

## 2017-01-10 NOTE — Plan of Care (Signed)
Patient up ad lib with steady gait. Affect is improving, compliant with medications and meals. Denies SI/HI/AVH and pain. Observed in room sitting in her chair reading Bible scriptures that she wrote on her pad. Attends group with appropriate and active participation. Milieu remains safe and therapeutic. Will continue to monitor.

## 2017-01-10 NOTE — Progress Notes (Signed)
[facility] MD Progress Note  01/10/2017 4:47 PM [name]  MRN:  [id number]  Subjective:   [name] is still very paranoid, refuses to talk about her situation or contact her family. She is not "alone when God is with her". She is able to hold a brief conversation but refuses to answer many questions. She wants me eventually to talk to her family: [name], [name] and [name] in [place] but does not know their numbers.   Treatment plan. She received Abilify injection. We continue Zyprexa and lower dose depakote.  Social/disposition. Discharge to home, family meeting would be helpful. Follow up at [facility]. Could we get ACT team?  Principal Problem: Bipolar I disorder, current or most recent episode manic, with psychotic features [facility]) Diagnosis:   Patient Active Problem List   Diagnosis Date Noted  . Bipolar I disorder, current or most recent episode manic, with psychotic features ([place]) [F31.2] 12/28/2016    Priority: High  . Bipolar affective disorder, mixed, severe, with psychotic behavior ([place]) [F31.64] 12/28/2016  . 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 Anxiety   . Depression   . Hypertension     Past Surgical History:  Procedure Laterality Date  . CESAREAN SECTION  2002, 2009   Family History:  Family History  Problem Relation Age of Onset  . Breast cancer Mother        Deceased, 21  . Hypertension Mother   . Arthritis Father   . Hypertension Father   . Healthy Son   . Asthma Daughter   . Diabetes type II Daughter    Family Psychiatric  History: none reported Social History:  Social History   Substance and Sexual Activity  Alcohol Use No     Social History   Substance and Sexual Activity  Drug Use No    Social History   Socioeconomic History  . Marital status: Married    Spouse name:  None  . Number of children: None  . Years of education: None  . Highest education level: None  Social Needs  . Financial resource strain: None  . Food insecurity - worry: None  . Food insecurity - inability: None  . Transportation needs - medical: None  . Transportation needs - non-medical: None  Occupational History  . None  Tobacco Use  . Smoking status: Never Smoker  . Smokeless tobacco: Never Used  Substance and Sexual Activity  . Alcohol use: No  . Drug use: No  . Sexual activity: None  Other Topics Concern  . None  Social History Narrative   She lives at home with 2 sons (81, 66) and 1 daughter (33).   She is currently not working.  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  She moved from [place] in 2013.   Additional Social History:                         Sleep: Fair  Appetite:  Fair  Current Medications: Current Facility-Administered Medications  Medication Dose Route Frequency Provider Last Rate Last Dose  . acetaminophen (TYLENOL) tablet 650 mg  650 mg Oral Q4H PRN [name], NP      . alum & mag hydroxide-simeth (MAALOX/MYLANTA) 200-200-20 MG/5ML suspension 30 mL  30 mL Oral Q6H PRN  [name], NP      . ARIPiprazole ER SRER 400 mg  400 mg Intramuscular Q28 days Jacquelinne Speak B, MD   400 mg at 01/04/17 1022  . divalproex (DEPAKOTE) DR tablet 500 mg  500 mg Oral Q12H Afton Lavalle B, MD   500 mg at 01/10/17 0839  . docusate sodium (COLACE) capsule 100 mg  100 mg Oral Q breakfast [name], NP   100 mg at 01/10/17 0839  . ferrous sulfate tablet 325 mg  325 mg Oral Q breakfast [name], NP   325 mg at 01/10/17 7425  . hydrOXYzine (ATARAX/VISTARIL) tablet 25 mg  25 mg Oral TID PRN Aishani Kalis B, MD      . magnesium hydroxide (MILK OF MAGNESIA) suspension 30 mL  30 mL Oral Daily PRN [name], NP      . OLANZapine (ZYPREXA) tablet 20 mg  20 mg Oral QHS [name], MD   20 mg at 01/09/17 2123  . traZODone (DESYREL) tablet 100 mg  100 mg Oral QHS PRN Keri Tavella B, MD        Lab Results:  Results for orders placed or performed during the hospital encounter of 12/28/16 (from the past 48 hour(s))  Valproic acid level     Status: None   Collection Time: 01/09/17  7:41 AM  Result Value Ref Range   Valproic Acid Lvl 94 50.0 - 100.0 ug/mL    Comment: Performed at [facility], [address] [place], [place] [address]  Ammonia     Status: Abnormal   Collection Time: 01/09/17  7:41 AM  Result Value Ref Range   Ammonia 52 (H) 9 - 35 umol/L    Comment: Performed at [facility], [address] [place], [place] [address]    Blood Alcohol level:  Lab Results  Component Value Date   ETH <10 12/27/2016   ETH  03/22/2007    <5        LOWEST DETECTABLE LIMIT FOR SERUM ALCOHOL IS 11 mg/dL FOR MEDICAL PURPOSES ONLY    Metabolic Disorder Labs: Lab Results  Component Value Date   HGBA1C 5.1 12/29/2016   MPG 99.67 12/29/2016   No results found for: PROLACTIN Lab Results  Component Value Date   CHOL 147 12/29/2016   TRIG 43 12/29/2016   HDL 54 12/29/2016   CHOLHDL 2.7 12/29/2016   VLDL 9 12/29/2016   LDLCALC 84 12/29/2016    Physical Findings: AIMS: Facial and Oral Movements Muscles of Facial Expression: None, normal Lips and Perioral Area: None, normal Jaw: None, normal Tongue: None, normal,Extremity Movements Upper (arms, wrists, hands, fingers): None, normal Lower (legs, knees, ankles, toes): None, normal, Trunk Movements Neck, shoulders, hips: None, normal, Overall Severity Severity of abnormal movements (highest score from questions above): None, normal Incapacitation due to abnormal movements: None, normal Patient's awareness of abnormal movements (rate only patient's report): No Awareness, Dental Status Current problems with teeth and/or dentures?: No Does patient usually wear dentures?: No  CIWA:     COWS:     Musculoskeletal: Strength & Muscle Tone: within normal limits Gait & Station: normal Patient leans: N/A  Psychiatric Specialty Exam: Physical Exam  Nursing note and vitals reviewed. Psychiatric: Her affect is blunt and inappropriate. Her speech is delayed. She is withdrawn. Thought content is paranoid. Cognition and memory are normal. She expresses impulsivity.    Review of Systems  Neurological: Negative.   Psychiatric/Behavioral: Positive for hallucinations.  All other systems reviewed and are negative.   Blood pressure 134/74, pulse 73, temperature 98 F (36.7 C), temperature source Oral, resp. rate 18, height 5\' 7"  (1.702 m), weight 72.6 kg (160 lb), SpO2 100 %.Body mass index is 25.06 kg/m.  General Appearance: Casual  Eye Contact:  Good  Speech:  Blocked  Volume:  Normal  Mood:  Irritable  Affect:  Congruent  Thought Process:  Disorganized and Descriptions of Associations: Tangential  Orientation:  Full (Time, Place, and Person)  Thought Content:  Delusions, Hallucinations: Auditory and Paranoid Ideation  Suicidal Thoughts:  No  Homicidal Thoughts:  No  Memory:  Immediate;   Fair Recent;   Fair Remote;   Fair  Judgement:  Poor  Insight:  Lacking  Psychomotor Activity:  Decreased  Concentration:  Concentration: Fair and Attention Span: Fair  Recall:  [employer] of Knowledge:  Fair  Language:  Fair  Akathisia:  No  Handed:  Right  AIMS (if indicated):     Assets:  Communication Skills Desire for Improvement Housing Physical Health Resilience Social Support  ADL's:  Intact  Cognition:  WNL  Sleep:  Number of Hours: 7.3     Treatment Plan Summary: Daily contact with patient to assess and evaluate symptoms and progress in treatment and Medication management   Ms. Snethen is a 47 year old female with a history of bipolar disorder admitted for psychotic break in the context of medication noncompliance.  #Mood and psychosis -continue Zyprexa 20  mg nightly  -continue Abilify maintena injections, next on 02/01/2017 -lower depakote to 500 mg nightly, level on 1/9 is 94, Ammonia 52  #Metabolic syndrome monitoring -Lipid panel, TSH and HgbA1C arenormal -EKG, QTc 438 -pregnancy test is negative  #Social -patient continues to refuse family contact -attempts to reach her son have failed -SW made CPS referral as we are unsure if her minor kids are taken care of -patient has divorce case in court on 2/14, she has no lawyer and is not ready to appear in court  #Disposition -discharge with family -follow up with [facility]    Orson Slick, MD 01/10/2017, 4:47 PM

## 2017-01-10 NOTE — BHH Group Notes (Signed)
[place] Group Notes:  (Nursing/MHT/Case Management/Adjunct)  Date:  01/10/2017  Time:  6:05 PM  Type of Therapy:  Psychoeducational Skills  Participation Level:  Active  Participation Quality:  Appropriate and Attentive  Affect:  Appropriate and Defensive  Cognitive:  Alert and Appropriate  Insight:  Appropriate, Good and Improving  Engagement in Group:  Engaged and Improving  Modes of Intervention:  Discussion and Education  Summary of Progress/Problems:  [name] 01/10/2017, 6:05 PM

## 2017-01-10 NOTE — Progress Notes (Signed)
Recreation Therapy Notes  Date: 01.10.2019  Time: 9:30 am  Location: Craft Room  Behavioral response: Appropriate  Intervention Topic: Stress  Discussion/Intervention: Group content on today was focused on stress. The group defined stress and way to cope with stress. Participants expressed how they know when they are stresses out. Individuals described the different ways they have to cope with stress. The group stated reasons why it is important to cope with stress. Patient explained what good stress is and some examples. The group participated in the intervention "Dealing with stress". Individuals had a chance to discuss positive and negative ways to deal with certain stressful situations.  Clinical Observations/Feedback:  Patient came to group and defined stress as going in a different direction than planned. She expressed that anxiety and being overwhelmed causes stress. Individual expressed workplace stress and parenting are some of her stressors. Patient stated that she minimizes stress by taking it easy because stress can break you down physically and mentally. Individual participated in the intervention and continues to work toward her goal. Pharmacist, community Wyatte Dames LRT/CTRS         Byrl Latin 01/10/2017 10:33 AM

## 2017-01-10 NOTE — Progress Notes (Signed)
Patient up ad lib with steady gait. Affect is improving, compliant with medications and meals. Denies SI/HI/AVH and pain. Observed in room sitting in her chair reading Bible scriptures that she wrote on her pad. Attends group with appropriate and active participation. Milieu remains safe and therapeutic. Will continue to monitor.

## 2017-01-10 NOTE — Plan of Care (Signed)
Patient slept for Estimated Hours of 7.30; Precautionary checks every 15 minutes for safety maintained, room free of safety hazards, patient sustains no injury or falls during this shift.

## 2017-01-10 NOTE — BHH Group Notes (Signed)
01/10/2017  Time: 1:00PM  Type of Therapy/Topic:  Group Therapy:  Balance in Life  Participation Level:  Active  Description of Group:   This group will address the concept of balance and how it feels and looks when one is unbalanced. Patients will be encouraged to process areas in their lives that are out of balance and identify reasons for remaining unbalanced. Facilitators will guide patients in utilizing problem-solving interventions to address and correct the stressor making their life unbalanced. Understanding and applying boundaries will be explored and addressed for obtaining and maintaining a balanced life. Patients will be encouraged to explore ways to assertively make their unbalanced needs known to significant others in their lives, using other group members and facilitator for support and feedback.  Therapeutic Goals: 1. Patient will identify two or more emotions or situations they have that consume much of in their lives. 2. Patient will identify signs/triggers that life has become out of balance:  3. Patient will identify two ways to set boundaries in order to achieve balance in their lives:  4. Patient will demonstrate ability to communicate their needs through discussion and/or role plays  Summary of Patient Progress: Pt continues to work towards their tx goals but has not yet reached them. Pt was able to appropriately participate in group discussion, and was able to offer support/validation to other group members. Pt reported she feels she spends too much time on, "my kids and working." Pt reported she doesn't feel her life gets out of balance often, but when it does she can tell because she is more irritable. Pt reported one way to keep her life in balance is, "to make new friends."   Therapeutic Modalities:   Cognitive Behavioral Therapy Solution-Focused Therapy Assertiveness Training  Alden Hipp, MSW, LCSW 01/10/2017 1:39 PM

## 2017-01-10 NOTE — BHH Group Notes (Signed)
  01/10/2017  Time: 0900  Type of Therapy and Topic:  Group Therapy:  Setting Goals Participation Level:  Active  Description of Group: In this process group, patients discussed using strengths to work toward goals and address challenges.  Patients identified two positive things about themselves and one goal they were working on.  Patients were given the opportunity to share openly and support each other's plan for self-empowerment.  The group discussed the value of gratitude and were encouraged to have a daily reflection of positive characteristics or circumstances.  Patients were encouraged to identify a plan to utilize their strengths to work on current challenges and goals.  Therapeutic Goals 1. Patient will verbalize personal strengths/positive qualities and relate how these can assist with achieving desired personal goals 2. Patients will verbalize affirmation of peers plans for personal change and goal setting 3. Patients will explore the value of gratitude and positive focus as related to successful achievement of goals 4. Patients will verbalize a plan for regular reinforcement of personal positive qualities and circumstances.  Summary of Patient Progress: Pt continues to work towards their tx goals but has not yet reached them. Pt was able to appropriately participate in group discussion, and was able to offer support/validation to other group members. Pt reported her goal for treatment is to, "do all important things for my recovery, like speaking with my social worker by the end of today."    Therapeutic Modalities Cognitive Behavioral Therapy Motivational Interviewing  Alden Hipp, MSW, LCSW 01/10/2017 9:36 AM

## 2017-01-10 NOTE — Progress Notes (Signed)
Patient ID: [name], female   DOB: [birth year], 47 y.o.   MRN: [id number] CSW received a call from Lillia Abed, Care Coordinator with [facility] [phone]).  Mr. Julian Hy informed CSW that he had been notified that pt was in the BMU and wanted to know what the discharge plans were for pt.  CSW informed Mr. Julian Hy that tentatively pt would return home to her apartment with follow-up services with [facility].  CSW informed Mr. Julian Hy that pt's psychiatrist has recommended a referral be made for CST or ACTT.  CSW informed Mr. Julian Hy that a discharge date had not been established by psychiatrist at this point, but CSW would be happy to follow back up with him when the discharge plans are more solid.  CSW met with pt who wanted to know when she would be discharged from the hospital.  Pt informed CSW that her son has a medical appointment with an eye specialist for 01/15/17 in [place], [place] and that this apt is very important. Pt shared that she is also scheduled to attend court to discuss alimony and divorce on 01/14/17.  She shared that she and her children are also required to attend parenting classes and she has to get this scheduled.  CSW inquired who was providing childcare to her children while she is in the hospital.  CSW was informed that pt's 23 year old son is providing the childcare for her youngest children.  CSW informed pt that she would discuss her concerns with her psychiatrist.  CSW informed pt that her psychiatrist has requested that a family meeting be scheduled as she is concerned about who will support pt when she is discharged.

## 2017-01-10 NOTE — Progress Notes (Signed)
Patient ID: [name], female   DOB: 1970/01/25, 47 y.o.   MRN: [id number] Very receptive and more outspoken this evening, not irritable or guarded or suspicious, comfortable talking about her children and family, missing court appointments; mood and affect still sad but improved and approaching baseline; pleasant, denied SI/HI/AVH.

## 2017-01-11 NOTE — Progress Notes (Signed)
Patient up ad lib with steady gait. Affect is improving, compliant with medications and meals. Denies SI/HI/AVH and pain. Reports that her energy level is high, denies depression, anxiety and pain. Attends group with appropriate and active participation. Milieu remains safe and therapeutic. Will continue to monitor

## 2017-01-11 NOTE — Progress Notes (Signed)
[facility] MD Progress Note  01/11/2017 2:59 PM [name]  MRN:  [id number]  Subjective:   [name] is still paranoid, hallucinating and easily irritated. She lives in "alternative reality". She told us that her three children are home alone and we send CPS to her place while in fact, 3 family members came from [place] to care for them. When confronted, she explains that there is a family feud. She refused to talk to her father or invite him for a meeting initially. Later she called him to schedule a meeting on Monday. She does not believe she has mental illness. Rather her symptoms result from stress. She does not believe she needs medications. I spoke with her 45 year old son at length. The mother has been volatile for a while. The family struggles tremendously since she lost her job almost a year ago.  Treatment plan. Patient reportedly did well on Abilify injections. We gave her one but she is improving very slowly on a combination of Zyprexa 20 mg and Depakote 500 mg QHS. She was on higher dose but VPA and ammonia elevated.  Social/ dosposition. She lives with 3 children off food stamps. No income. Children have Medicaid.There is support from the community and family in [place]. Follow up with [facility].   Principal Problem: Bipolar I disorder, current or most recent episode manic, with psychotic features ([place]) Diagnosis:   Patient Active Problem List   Diagnosis Date Noted  . Bipolar I disorder, current or most recent episode manic, with psychotic features ([place]) [F31.2] 12/28/2016    Priority: High  . Bipolar affective disorder, mixed, severe, with psychotic behavior ([place]) [F31.64] 12/28/2016  . 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 Anxiety   . Depression   . Hypertension     Past Surgical History:  Procedure  Laterality Date  . CESAREAN SECTION  2002, 2009   Family History:  Family History  Problem Relation Age of Onset  . Breast cancer Mother        Deceased, 46  . Hypertension Mother   . Arthritis Father   . Hypertension Father   . Healthy Son   . Asthma Daughter   . Diabetes type II Daughter    Family Psychiatric  History: none reported Social History:  Social History   Substance and Sexual Activity  Alcohol Use No     Social History   Substance and Sexual Activity  Drug Use No    Social History   Socioeconomic History  . Marital status: Married    Spouse name: None  . Number of children: None  . Years of education: None  . Highest education level: None  Social Needs  . Financial resource strain: None  . Food insecurity - worry: None  . Food insecurity - inability: None  . Transportation needs - medical: None  . Transportation needs - non-medical: None  Occupational History  . None  Tobacco Use  . Smoking status: Never Smoker  . Smokeless tobacco: Never Used  Substance and Sexual Activity  . Alcohol use: No  . Drug use: No  . Sexual activity: None  Other Topics Concern  . None  Social History Narrative   She lives at home with 2 sons (45, 45) and 1 daughter (54).   She is currently not working.  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  She moved from [place] in 2013.   Additional Social History:                         Sleep: Fair  Appetite:  Fair  Current Medications: Current Facility-Administered Medications  Medication Dose Route Frequency Provider Last Rate Last Dose  . acetaminophen (TYLENOL) tablet 650 mg  650 mg Oral Q4H PRN [name], NP      . alum & mag hydroxide-simeth (MAALOX/MYLANTA) 200-200-20 MG/5ML suspension 30 mL  30 mL Oral Q6H PRN [name], NP      . ARIPiprazole ER SRER 400 mg  400 mg Intramuscular Q28 days Nicolasa Milbrath B, MD   400 mg at 01/04/17 1022  .  divalproex (DEPAKOTE) DR tablet 250 mg  250 mg Oral Q12H Robert Sperl B, MD   250 mg at 01/11/17 0831  . docusate sodium (COLACE) capsule 100 mg  100 mg Oral Q breakfast [name], NP   100 mg at 01/11/17 0831  . ferrous sulfate tablet 325 mg  325 mg Oral Q breakfast [name], NP   325 mg at 01/11/17 0831  . hydrOXYzine (ATARAX/VISTARIL) tablet 25 mg  25 mg Oral TID PRN Erminia Mcnew B, MD      . magnesium hydroxide (MILK OF MAGNESIA) suspension 30 mL  30 mL Oral Daily PRN [name], NP      . OLANZapine (ZYPREXA) tablet 20 mg  20 mg Oral QHS [name], MD   20 mg at 01/10/17 2129  . traZODone (DESYREL) tablet 100 mg  100 mg Oral QHS PRN Slater Mcmanaman B, MD        Lab Results: No results found for this or any previous visit (from the past 48 hour(s)).  Blood Alcohol level:  Lab Results  Component Value Date   ETH <10 12/27/2016   ETH  03/22/2007    <5        LOWEST DETECTABLE LIMIT FOR SERUM ALCOHOL IS 11 mg/dL FOR MEDICAL PURPOSES ONLY    Metabolic Disorder Labs: Lab Results  Component Value Date   HGBA1C 5.1 12/29/2016   MPG 99.67 12/29/2016   No results found for: PROLACTIN Lab Results  Component Value Date   CHOL 147 12/29/2016   TRIG 43 12/29/2016   HDL 54 12/29/2016   CHOLHDL 2.7 12/29/2016   VLDL 9 12/29/2016   LDLCALC 84 12/29/2016    Physical Findings: AIMS: Facial and Oral Movements Muscles of Facial Expression: None, normal Lips and Perioral Area: None, normal Jaw: None, normal Tongue: None, normal,Extremity Movements Upper (arms, wrists, hands, fingers): None, normal Lower (legs, knees, ankles, toes): None, normal, Trunk Movements Neck, shoulders, hips: None, normal, Overall Severity Severity of abnormal movements (highest score from questions above): None, normal Incapacitation due to abnormal movements: None, normal Patient's awareness of abnormal movements (rate only patient's report): No Awareness, Dental  Status Current problems with teeth and/or dentures?: No Does patient usually wear dentures?: No  CIWA:    COWS:     Musculoskeletal: Strength & Muscle Tone: within normal limits Gait & Station: normal Patient leans: N/A  Psychiatric Specialty Exam: Physical Exam  Nursing note and vitals reviewed. Psychiatric: Her affect is labile and inappropriate. Her speech is rapid and/or pressured. She is agitated and actively hallucinating. Thought content is paranoid. Cognition and memory are normal. She expresses impulsivity.    Review of Systems  Neurological: Negative.   Psychiatric/Behavioral: Positive for hallucinations.  All other systems reviewed and are negative.   Blood pressure 123/82, pulse 79, temperature 98 F (36.7 C), temperature source Oral, resp. rate 18, height 5\' 7"  (1.702 m), weight 72.6 kg (160 lb), SpO2 100 %.Body mass index is 25.06 kg/m.  General Appearance: Casual  Eye Contact:  Fair  Speech:  Pressured  Volume:  Increased  Mood:  Dysphoric and Irritable  Affect:  Congruent  Thought Process:  Goal Directed and Descriptions of Associations: Intact  Orientation:  Full (Time, Place, and Person)  Thought Content:  Delusions and Paranoid Ideation  Suicidal Thoughts:  No  Homicidal Thoughts:  No  Memory:  Immediate;   Fair Recent;   Fair Remote;   Fair  Judgement:  Poor  Insight:  Lacking  Psychomotor Activity:  Increased  Concentration:  Concentration: Fair and Attention Span: Fair  Recall:  [employer] of Knowledge:  Fair  Language:  Fair  Akathisia:  No  Handed:  Right  AIMS (if indicated):     Assets:  Communication Skills Desire for Improvement Housing Physical Health Resilience Social Support  ADL's:  Intact  Cognition:  WNL  Sleep:  Number of Hours: 6.15     Treatment Plan Summary: Daily contact with patient to assess and evaluate symptoms and progress in treatment and Medication management   [name] is a 47 year old female with a  history of bipolar disorder admitted for psychotic break in the context of medication noncompliance.  #Mood and psychosis -continue Zyprexa 20 mg nightly -continue Abilify maintena injections, next on 02/01/2017 -lower depakote to 500 mg nightly, level on 1/9 is 94, Ammonia 52  #Metabolic syndrome monitoring -Lipid panel, TSH and HgbA1C arenormal -EKG, QTc 438 -pregnancy test is negative  #Social -patient reluctantly agreed to family contact -SW made CPS referral as we are unsure if her minor kids are taken care of -patient has divorce case in court on 2/14, she is not ready to appear in court  #Disposition -discharge with family -follow up with [facility]     Orson Slick, MD 01/11/2017, 2:59 PM

## 2017-01-11 NOTE — BHH Group Notes (Signed)
  01/11/2017  Time: 1:00PM  Type of Therapy and Topic:  Group Therapy:  Feelings around Relapse and Recovery  Participation Level:  None   Description of Group:    Patients in this group will discuss emotions they experience before and after a relapse. They will process how experiencing these feelings, or avoidance of experiencing them, relates to having a relapse. Facilitator will guide patients to explore emotions they have related to recovery. Patients will be encouraged to process which emotions are more powerful. They will be guided to discuss the emotional reaction significant others in their lives may have to their relapse or recovery. Patients will be assisted in exploring ways to respond to the emotions of others without this contributing to a relapse.  Therapeutic Goals: 1. Patient will identify two or more emotions that lead to a relapse for them 2. Patient will identify two emotions that result when they relapse 3. Patient will identify two emotions related to recovery 4. Patient will demonstrate ability to communicate their needs through discussion and/or role plays   Summary of Patient Progress: Pt attended group but did not participate in group discussions. Pt was not disruptive, but did not contribute to conversations or discussions with other group members. Pt was observed with a flat affect and when directly asked how she was feeling today, pt stated, "Downcast because my discharge is not so." Pt continues to work towards her tx goals but has not yet reached them.   Therapeutic Modalities:   Cognitive Behavioral Therapy Solution-Focused Therapy Assertiveness Training Relapse Prevention Therapy  Alden Hipp, MSW, LCSW 01/11/2017 1:57 PM

## 2017-01-11 NOTE — Tx Team (Signed)
Interdisciplinary Treatment and Diagnostic Plan Update  01/11/2017 Time of Session: 10:50am [name] MRN: [id number]  Principal Diagnosis: Bipolar I disorder, current or most recent episode manic, with psychotic features ([place])  Secondary Diagnoses: Principal Problem:   Bipolar I disorder, current or most recent episode manic, with psychotic features ([place])   Current Medications:  Current Facility-Administered Medications  Medication Dose Route Frequency Provider Last Rate Last Dose  . acetaminophen (TYLENOL) tablet 650 mg  650 mg Oral Q4H PRN [name], NP      . alum & mag hydroxide-simeth (MAALOX/MYLANTA) 200-200-20 MG/5ML suspension 30 mL  30 mL Oral Q6H PRN [name], NP      . ARIPiprazole ER SRER 400 mg  400 mg Intramuscular Q28 days [name], [name], MD   400 mg at 01/04/17 1022  . divalproex (DEPAKOTE) DR tablet 250 mg  250 mg Oral Q12H [name], [name], MD   250 mg at 01/11/17 0831  . docusate sodium (COLACE) capsule 100 mg  100 mg Oral Q breakfast [name], NP   100 mg at 01/11/17 0831  . ferrous sulfate tablet 325 mg  325 mg Oral Q breakfast [name], NP   325 mg at 01/11/17 0831  . hydrOXYzine (ATARAX/VISTARIL) tablet 25 mg  25 mg Oral TID PRN [name], [name], MD      . magnesium hydroxide (MILK OF MAGNESIA) suspension 30 mL  30 mL Oral Daily PRN [name], NP      . OLANZapine (ZYPREXA) tablet 20 mg  20 mg Oral QHS [name], MD   20 mg at 01/10/17 2129  . traZODone (DESYREL) tablet 100 mg  100 mg Oral QHS PRN [name], [name], MD       PTA Medications: No medications prior to admission.    Patient Stressors: Financial difficulties Medication change or noncompliance  Patient Strengths: Ability for insight Average or above average intelligence Capable of independent living Communication skills Supportive family/friends  Treatment Modalities: Medication Management, Group therapy, Case management,   1 to 1 session with clinician, Psychoeducation, Recreational therapy.   Physician Treatment Plan for Primary Diagnosis: Bipolar I disorder, current or most recent episode manic, with psychotic features ([place]) Long Term Goal(s): Improvement in symptoms so as ready for discharge NA   Short Term Goals: Ability to identify changes in lifestyle to reduce recurrence of condition will improve Ability to verbalize feelings will improve Ability to disclose and discuss suicidal ideas Ability to demonstrate self-control will improve Ability to identify and develop effective coping behaviors will improve Ability to maintain clinical measurements within normal limits will improve Compliance with prescribed medications will improve Ability to identify triggers associated with substance abuse/mental health issues will improve NA  Medication Management: Evaluate patient's response, side effects, and tolerance of medication regimen.  Therapeutic Interventions: 1 to 1 sessions, Unit Group sessions and Medication administration.  Evaluation of Outcomes: Progressing  Physician Treatment Plan for Secondary Diagnosis: Principal Problem:   Bipolar I disorder, current or most recent episode manic, with psychotic features ([place])  Long Term Goal(s): Improvement in symptoms so as ready for discharge NA   Short Term Goals: Ability to identify changes in lifestyle to reduce recurrence of condition will improve Ability to verbalize feelings will improve Ability to disclose and discuss suicidal ideas Ability to demonstrate self-control will improve Ability to identify and develop effective coping behaviors will improve Ability to maintain clinical measurements within normal limits will improve Compliance with prescribed medications  will improve Ability to identify triggers associated with substance abuse/mental health issues will improve NA     Medication Management: Evaluate patient's response, side  effects, and tolerance of medication regimen.  Therapeutic Interventions: 1 to 1 sessions, Unit Group sessions and Medication administration.  Evaluation of Outcomes: Progressing   RN Treatment Plan for Primary Diagnosis: Bipolar I disorder, current or most recent episode manic, with psychotic features ([place]) Long Term Goal(s): Knowledge of disease and therapeutic regimen to maintain health will improve  Short Term Goals: Ability to identify and develop effective coping behaviors will improve and Compliance with prescribed medications will improve  Medication Management: RN will administer medications as ordered by provider, will assess and evaluate patient's response and provide education to patient for prescribed medication. RN will report any adverse and/or side effects to prescribing provider.  Therapeutic Interventions: 1 on 1 counseling sessions, Psychoeducation, Medication administration, Evaluate responses to treatment, Monitor vital signs and CBGs as ordered, Perform/monitor CIWA, COWS, AIMS and Fall Risk screenings as ordered, Perform wound care treatments as ordered.  Evaluation of Outcomes: Progressing   LCSW Treatment Plan for Primary Diagnosis: Bipolar I disorder, current or most recent episode manic, with psychotic features ([place]) Long Term Goal(s): Safe transition to appropriate next level of care at discharge, Engage patient in therapeutic group addressing interpersonal concerns.  Short Term Goals: Engage patient in aftercare planning with referrals and resources, Identify triggers associated with mental health/substance abuse issues and Increase skills for wellness and recovery  Therapeutic Interventions: Assess for all discharge needs, 1 to 1 time with Social worker, Explore available resources and support systems, Assess for adequacy in community support network, Educate family and significant other(s) on suicide prevention, Complete Psychosocial Assessment, Interpersonal  group therapy.  Evaluation of Outcomes: Progressing   Progress in Treatment: Attending groups: Yes. Participating in groups: Yes. Taking medication as prescribed: Yes. Toleration medication: Yes. Family/Significant other contact made: Yes, individual(s) contacted:  Pt's son, [name] was contacted along with her father.D Patient understands diagnosis: Yes. Discussing patient identified problems/goals with staff: Yes. Medical problems stabilized or resolved: Yes. Denies suicidal/homicidal ideation: Yes. Issues/concerns per patient self-inventory: No. Other:    New problem(s) identified: No, Describe:     New Short Term/Long Term Goal(s): To continue to improve and focus on her recovery  Discharge Plan or Barriers: discharge home with family and follow up with outpatient provider Beverly Sessions)  Reason for Continuation of Hospitalization: Mania Medication stabilization, psychotic features-religiously preoccupied, paranoia  Estimated Length of Stay: 3-5 days  Recreational Therapy: Patient Stressors: None. Patient Goal: Patient will engage in interactions with peers and staff in a pro-social manner x5 days.   Attendees: Patient: 01/11/2017 4:44 PM  Physician: Orson Slick, MD 01/11/2017 4:44 PM  Nursing: Elige Radon, RN 01/11/2017 4:44 PM  RN Care Manager: 01/11/2017 4:44 PM  Social Worker: Derrek Gu, LCSW 01/11/2017 4:44 PM  Recreational Therapist: Roanna Epley, LRT 01/11/2017 4:44 PM  Other:  01/11/2017 4:44 PM  Other:  01/11/2017 4:44 PM  Other: 01/11/2017 4:44 PM    Scribe for Treatment Team: Devona Konig, LCSW 01/11/2017 4:44 PM

## 2017-01-11 NOTE — Plan of Care (Signed)
Patient up ad lib with steady gait. Affect is improving, compliant with medications and meals. Denies SI/HI/AVH and pain. Reports that her energy level is high, denies depression, anxiety and pain. Attends group with appropriate and active participation. Milieu remains safe and therapeutic. Will continue to monitor

## 2017-01-11 NOTE — Plan of Care (Signed)
Patient slept for Estimated Hours of 6.15; Precautionary checks every 15 minutes for safety maintained, room free of safety hazards, patient sustains no injury or falls during this shift.  

## 2017-01-11 NOTE — Progress Notes (Signed)
Recreation Therapy Notes   Date: 01.11.2019  Time: 9:30 am  Location: Craft Room  Behavioral response: Appropriate  Intervention Topic: Leisure  Discussion/Intervention: Group content today was focused on leisure. The group defined what leisure is and some positive leisure activities they participate in. Individuals identified the difference between good and bad leisure. Participants expressed how they feel after participating in the leisure of their choice. The group discussed how they go about picking a leisure activity and if others are involved in their leisure activities. The patient stated how many leisure activities they too choose from and reasons why it is important to have leisure time. Individuals participated in the intervention "Leisure Jeopardy" where they had a chance to identify new leisure activities as well as benefits of leisure. Clinical Observations/Feedback:  Patient came to group and stated that she goes out with family during her leisure time. She stated this activity makes her sociable. Individual continues to make progress towards her goal. Hanalei Glace LRT/CTRS            Nicoya Friel 01/11/2017 1:57 PM

## 2017-01-12 NOTE — BHH Group Notes (Signed)
LCSW Group Therapy Note  01/12/2017 1:15pm  Type of Therapy and Topic:  Group Therapy:  Cognitive Distortions  Participation Level:  Active   Description of Group:    Patients in this group will be introduced to the topic of cognitive distortions.  Patients will identify and describe cognitive distortions, describe the feelings these distortions create for them.  Patients will identify one or more situations in their personal life where they have cognitively distorted thinking and will verbalize challenging this cognitive distortion through positive thinking skills.  Patients will practice the skill of using positive affirmations to challenge cognitive distortions using affirmation cards.    Therapeutic Goals:  1. Patient will identify two or more cognitive distortions they have used 2. Patient will identify one or more emotions that stem from use of a cognitive distortion 3. Patient will demonstrate use of a positive affirmation to counter a cognitive distortion through discussion and/or role play. 4. Patient will describe one way cognitive distortions can be detrimental to wellness   Summary of Patient Progress: Pt actively participated in group discussion. Pt was able to identify cognitive distortions she has used in the past. Pt shared some of her unhelpful thinking styles that are affecting her the most such labelling. Pt used positive affirmations to counter a cognitive distortion through discussion.       Therapeutic Modalities:   Cognitive Behavioral Therapy Motivational Interviewing   Krimson Massmann  CUEBAS-COLON, LCSW 01/12/2017 11:23 AM

## 2017-01-12 NOTE — Progress Notes (Signed)
[facility] MD Progress Note  01/12/2017 8:17 PM [name]  MRN:  [id number]  Subjective:   [name] is still paranoid.  Today she is very reserved and staring at the wall, not facing me.  She barely says anything to me, she tells me she does not need anything from me.  Per nursing she continues to be hyper-religious and praying, responding to internal stimuli.  Treatment plan. Patient reportedly did well on Abilify injections. We gave her one but she is improving very slowly on a combination of Zyprexa 20 mg and Depakote 500 mg QHS. She was on higher dose but VPA and ammonia elevated.  Social/ dosposition. She lives with 3 children off food stamps. No income. Children have Medicaid.There is support from the community and family in [place]. Follow up with [facility].   Principal Problem: Bipolar I disorder, current or most recent episode manic, with psychotic features ([place]) Diagnosis:   Patient Active Problem List   Diagnosis Date Noted  . Bipolar affective disorder, mixed, severe, with psychotic behavior ([place]) [F31.64] 12/28/2016  . Bipolar I disorder, current or most recent episode manic, with psychotic features ([place]) [F31.2] 12/28/2016  . 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 Anxiety   . Depression   . Hypertension     Past Surgical History:  Procedure Laterality Date  . CESAREAN SECTION  2002, 2009   Family History:  Family History  Problem Relation Age of Onset  . Breast cancer Mother        Deceased, 55  . Hypertension Mother   . Arthritis Father   . Hypertension Father   . Healthy Son   . Asthma Daughter   . Diabetes type II Daughter    Family Psychiatric  History: none reported Social History:  Social History   Substance and Sexual Activity  Alcohol Use No     Social History   Substance and  Sexual Activity  Drug Use No    Social History   Socioeconomic History  . Marital status: Married    Spouse name: None  . Number of children: None  . Years of education: None  . Highest education level: None  Social Needs  . Financial resource strain: None  . Food insecurity - worry: None  . Food insecurity - inability: None  . Transportation needs - medical: None  . Transportation needs - non-medical: None  Occupational History  . None  Tobacco Use  . Smoking status: Never Smoker  . Smokeless tobacco: Never Used  Substance and Sexual Activity  . Alcohol use: No  . Drug use: No  . Sexual activity: None  Other Topics Concern  . None  Social History Narrative   She lives at home with 2 sons (40, 41) and 1 daughter (68).   She is currently not working.  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  She moved from [place] in 2013.   Additional Social History:                         Sleep: Fair  Appetite:  Fair  Current Medications: Current Facility-Administered Medications  Medication Dose Route Frequency Provider Last Rate Last Dose  . acetaminophen (TYLENOL) tablet 650 mg  650 mg Oral Q4H PRN [name], NP      .  alum & mag hydroxide-simeth (MAALOX/MYLANTA) 200-200-20 MG/5ML suspension 30 mL  30 mL Oral Q6H PRN [name], NP      . ARIPiprazole ER SRER 400 mg  400 mg Intramuscular Q28 days [name], [name], MD   400 mg at 01/04/17 1022  . divalproex (DEPAKOTE) DR tablet 250 mg  250 mg Oral Q12H [name], [name], MD   250 mg at 01/12/17 0754  . docusate sodium (COLACE) capsule 100 mg  100 mg Oral Q breakfast [name], NP   100 mg at 01/12/17 0754  . ferrous sulfate tablet 325 mg  325 mg Oral Q breakfast [name], NP   325 mg at 01/12/17 0754  . hydrOXYzine (ATARAX/VISTARIL) tablet 25 mg  25 mg Oral TID PRN [name], [name], MD      . magnesium hydroxide (MILK OF MAGNESIA)  suspension 30 mL  30 mL Oral Daily PRN [name], NP      . OLANZapine (ZYPREXA) tablet 20 mg  20 mg Oral QHS [name], MD   20 mg at 01/11/17 2201  . traZODone (DESYREL) tablet 100 mg  100 mg Oral QHS PRN [name], [name], MD        Lab Results: No results found for this or any previous visit (from the past 48 hour(s)).  Blood Alcohol level:  Lab Results  Component Value Date   ETH <10 12/27/2016   ETH  03/22/2007    <5        LOWEST DETECTABLE LIMIT FOR SERUM ALCOHOL IS 11 mg/dL FOR MEDICAL PURPOSES ONLY    Metabolic Disorder Labs: Lab Results  Component Value Date   HGBA1C 5.1 12/29/2016   MPG 99.67 12/29/2016   No results found for: PROLACTIN Lab Results  Component Value Date   CHOL 147 12/29/2016   TRIG 43 12/29/2016   HDL 54 12/29/2016   CHOLHDL 2.7 12/29/2016   VLDL 9 12/29/2016   LDLCALC 84 12/29/2016    Physical Findings: AIMS: Facial and Oral Movements Muscles of Facial Expression: None, normal Lips and Perioral Area: None, normal Jaw: None, normal Tongue: None, normal,Extremity Movements Upper (arms, wrists, hands, fingers): None, normal Lower (legs, knees, ankles, toes): None, normal, Trunk Movements Neck, shoulders, hips: None, normal, Overall Severity Severity of abnormal movements (highest score from questions above): None, normal Incapacitation due to abnormal movements: None, normal Patient's awareness of abnormal movements (rate only patient's report): No Awareness, Dental Status Current problems with teeth and/or dentures?: No Does patient usually wear dentures?: No  CIWA:    COWS:     Musculoskeletal: Strength & Muscle Tone: within normal limits Gait & Station: normal Patient leans: N/A  Psychiatric Specialty Exam: Physical Exam  Nursing note and vitals reviewed. Psychiatric: She is agitated and actively hallucinating. Thought content is paranoid. Cognition and memory are normal. She expresses impulsivity.    Review of  Systems  Neurological: Negative.   Psychiatric/Behavioral: Positive for hallucinations.  All other systems reviewed and are negative.   Blood pressure (!) 141/87, pulse 72, temperature 98.6 F (37 C), temperature source Oral, resp. rate 18, height 5\' 7"  (1.702 m), weight 72.6 kg (160 lb), SpO2 100 %.Body mass index is 25.06 kg/m.  General Appearance: Casual  Eye Contact:  Fair  Speech:  Pressured  Volume:  Normal  Mood:  Dysphoric and Irritable  Affect:  Congruent  Thought Process:  Goal Directed and Descriptions of Associations: Intact  Orientation:  Full (Time, Place, and Person)  Thought Content:  Delusions and Paranoid Ideation  Suicidal Thoughts:  No  Homicidal Thoughts:  No  Memory:  Immediate;   Fair Recent;   Fair Remote;   Fair  Judgement:  Poor  Insight:  Lacking  Psychomotor Activity:  Increased  Concentration:  Concentration: Fair and Attention Span: Fair  Recall:  [employer] of Knowledge:  Fair  Language:  Fair  Akathisia:  No  Handed:  Right  AIMS (if indicated):     Assets:  Communication Skills Desire for Improvement Housing Physical Health Resilience Social Support  ADL's:  Intact  Cognition:  WNL  Sleep:  Number of Hours: 7.45     Treatment Plan Summary: Daily contact with patient to assess and evaluate symptoms and progress in treatment and Medication management   [name] is a 47 year old female with a history of bipolar disorder admitted for psychotic break in the context of medication noncompliance.  #Mood and psychosis -continue Zyprexa 20 mg nightly -continue Abilify maintena injections, next on 02/01/2017 -lower depakote to 500 mg nightly, level on 1/9 is 94, Ammonia 52  #Metabolic syndrome monitoring -Lipid panel, TSH and HgbA1C arenormal -EKG, QTc 438 -pregnancy test is negative  #Social -patient reluctantly agreed to family contact -SW made CPS referral as we are unsure if her minor kids are taken care of -patient has  divorce case in court on 2/14, she is not ready to appear in court  #Disposition -discharge with family -follow up with [facility]     Jolene Schimke, MD 01/12/2017, 8:17 PM

## 2017-01-12 NOTE — Progress Notes (Signed)
Patient was isolative in room, guarded but was able to come to the medication room. She took all her medications independently and discussed her mental status. Patient denies suicidal/homicidal thoughts. Was seen displaying ritualistic behavior: religiosity and some other bizarre behavior. Before taking her medications, patient took them, starred  And appeared to be saying a prayer. She avoided details during her conversations with staff. Currently in bed resting. No sign of distress. Support provided and safety precautions maintained.

## 2017-01-12 NOTE — Plan of Care (Signed)
Patient stayed in room but was able to come to the medication room and discussed about her mental health status

## 2017-01-12 NOTE — Plan of Care (Signed)
Patient was isolative in room, guarded but was able to come to the medication room. She took all her medications independently and discussed her mental status. Patient denies suicidal/homicidal thoughts. Was seen displaying ritualistic behavior: religiosity and some other bizarre behavior. Before taking her medications, patient took them, starred  And appeared to be saying a prayer. She avoided details during her conversations with staff. Currently in bed resting. No sign of distress. Support provided and safety precautions maintained.

## 2017-01-12 NOTE — Progress Notes (Signed)
Patient was isolative to room and remains guarded. She is compliant with  medications and meals and was able to discuss her mental status with this Probation officer. Patient denies suicidal/homicidal thoughts. Was seen displaying ritualistic behavior: religiosity and some other bizarre behavior. Before taking her medications she appeared to be saying a prayer. She avoided details during her conversations with staff.

## 2017-01-13 MED ORDER — OLANZAPINE 10 MG PO TABS
30.0000 mg | ORAL_TABLET | Freq: Every day | ORAL | Status: DC
  Administered 2017-01-13 – 2017-01-17 (×5): 30 mg via ORAL
  Filled 2017-01-13 (×5): qty 3

## 2017-01-13 NOTE — [facility] Group Notes (Signed)
[facility] Group Notes:  (Nursing/MHT/Case Management/Adjunct)  Date:  01/13/2017  Time:  5:48 AM  Type of Therapy:  Psychoeducational Skills  Participation Level:  Active  Participation Quality:  Appropriate and Attentive  Affect:  Appropriate  Cognitive:  Appropriate  Insight:  Appropriate  Engagement in Group:  Engaged  Modes of Intervention:  Discussion, Socialization and Support  Summary of Progress/Problems:  [name] 01/13/2017, 5:48 AM

## 2017-01-13 NOTE — Progress Notes (Signed)
Patient remains isolative to room and remains guarded on approach. She is compliant with medications although, she really didn't want to engage in conversation with staff  . Patient denies suicidal/homicidal thoughts. Was seen displaying ritualistic behavior: religiosity and some other bizarre behavior. Before taking her medications she appeared to be saying a prayer. She avoided details during her conversations with staff.

## 2017-01-13 NOTE — Progress Notes (Signed)
[facility] MD Progress Note  01/13/2017 8:25 PM [name]  MRN:  [id number]  Subjective:   [name] more interactive today.  She is walking around the unit.  When I see her she is laying in bed but she looks at me and talks to me.  States she is resting but has also been thinking about going home, how she needs a job, she planned to get a job in 2019.  She rambles and not everything she says makes sense.  She denies SE from the meds. No complaints.  Denies si/hi/avh Continues to be hyperreligious too   Treatment plan. Patient reportedly did well on Abilify injections. We gave her one but she is improving very slowly on a combination of Zyprexa 20 mg and Depakote 500 mg QHS. She was on higher dose but VPA and ammonia elevated.  Social/ dosposition. She lives with 3 children off food stamps. No income. Children have Medicaid.There is support from the community and family in [place]. Follow up with [facility].   Principal Problem: Bipolar I disorder, current or most recent episode manic, with psychotic features ([place]) Diagnosis:   Patient Active Problem List   Diagnosis Date Noted  . Bipolar affective disorder, mixed, severe, with psychotic behavior ([place]) [F31.64] 12/28/2016  . Bipolar I disorder, current or most recent episode manic, with psychotic features ([place]) [F31.2] 12/28/2016  . 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 Anxiety   . Depression   . Hypertension     Past Surgical History:  Procedure Laterality Date  . CESAREAN SECTION  2002, 2009   Family History:  Family History  Problem Relation Age of Onset  . Breast cancer Mother        Deceased, 68  . Hypertension Mother   . Arthritis Father   . Hypertension Father   . Healthy Son   . Asthma Daughter   . Diabetes type II Daughter    Family Psychiatric   History: none reported Social History:  Social History   Substance and Sexual Activity  Alcohol Use No     Social History   Substance and Sexual Activity  Drug Use No    Social History   Socioeconomic History  . Marital status: Married    Spouse name: None  . Number of children: None  . Years of education: None  . Highest education level: None  Social Needs  . Financial resource strain: None  . Food insecurity - worry: None  . Food insecurity - inability: None  . Transportation needs - medical: None  . Transportation needs - non-medical: None  Occupational History  . None  Tobacco Use  . Smoking status: Never Smoker  . Smokeless tobacco: Never Used  Substance and Sexual Activity  . Alcohol use: No  . Drug use: No  . Sexual activity: None  Other Topics Concern  . None  Social History Narrative   She lives at home with 2 sons (69, 68) and 1 daughter (28).   She is currently not working.  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  She moved from [place] in 2013.   Additional Social History:                         Sleep: Fair  Appetite:  Fair  Current Medications: Current Facility-Administered Medications  Medication Dose Route Frequency Provider Last Rate Last Dose  . acetaminophen (TYLENOL) tablet 650 mg  650 mg Oral Q4H PRN [name], NP      . alum & mag hydroxide-simeth (MAALOX/MYLANTA) 200-200-20 MG/5ML suspension 30 mL  30 mL Oral Q6H PRN [name], NP      . ARIPiprazole ER SRER 400 mg  400 mg Intramuscular Q28 days [name], [name], MD   400 mg at 01/04/17 1022  . divalproex (DEPAKOTE) DR tablet 250 mg  250 mg Oral Q12H [name], [name], MD   250 mg at 01/13/17 1955  . docusate sodium (COLACE) capsule 100 mg  100 mg Oral Q breakfast [name], NP   100 mg at 01/13/17 0829  . ferrous sulfate tablet 325 mg  325 mg Oral Q breakfast [name], NP   325 mg at 01/13/17 4081   . hydrOXYzine (ATARAX/VISTARIL) tablet 25 mg  25 mg Oral TID PRN [name], [name], MD      . magnesium hydroxide (MILK OF MAGNESIA) suspension 30 mL  30 mL Oral Daily PRN Lord, Asa Saunas, NP      . OLANZapine (ZYPREXA) tablet 30 mg  30 mg Oral QHS [name], [name], MD      . traZODone (DESYREL) tablet 100 mg  100 mg Oral QHS PRN [name], [name], MD   100 mg at 01/12/17 2125    Lab Results: No results found for this or any previous visit (from the past 48 hour(s)).  Blood Alcohol level:  Lab Results  Component Value Date   ETH <10 12/27/2016   ETH  03/22/2007    <5        LOWEST DETECTABLE LIMIT FOR SERUM ALCOHOL IS 11 mg/dL FOR MEDICAL PURPOSES ONLY    Metabolic Disorder Labs: Lab Results  Component Value Date   HGBA1C 5.1 12/29/2016   MPG 99.67 12/29/2016   No results found for: PROLACTIN Lab Results  Component Value Date   CHOL 147 12/29/2016   TRIG 43 12/29/2016   HDL 54 12/29/2016   CHOLHDL 2.7 12/29/2016   VLDL 9 12/29/2016   LDLCALC 84 12/29/2016    Physical Findings: AIMS: Facial and Oral Movements Muscles of Facial Expression: None, normal Lips and Perioral Area: None, normal Jaw: None, normal Tongue: None, normal,Extremity Movements Upper (arms, wrists, hands, fingers): None, normal Lower (legs, knees, ankles, toes): None, normal, Trunk Movements Neck, shoulders, hips: None, normal, Overall Severity Severity of abnormal movements (highest score from questions above): None, normal Incapacitation due to abnormal movements: None, normal Patient's awareness of abnormal movements (rate only patient's report): No Awareness, Dental Status Current problems with teeth and/or dentures?: No Does patient usually wear dentures?: No  CIWA:    COWS:     Musculoskeletal: Strength & Muscle Tone: within normal limits Gait & Station: normal Patient leans: N/A  Psychiatric Specialty Exam: Physical Exam  Nursing note and vitals reviewed. Psychiatric:  She is actively hallucinating. Thought content is paranoid. Cognition and memory are normal. She expresses impulsivity.    Review of Systems  Neurological: Negative.   Psychiatric/Behavioral: Positive for hallucinations.  All other systems reviewed and are negative.   Blood pressure 138/86, pulse 76, temperature 98.5 F (36.9 C), temperature source Oral, resp. rate 18, height 5\' 7"  (1.702 m), weight 72.6 kg (160 lb), SpO2 100 %.Body mass index is 25.06 kg/m.  General Appearance: Casual  Eye Contact:  Fair  Speech:  Pressured  Volume:  Normal  Mood:  calm  Affect:  Congruent  Thought Process:  Goal Directed and Descriptions of Associations: Intact  Orientation:  Full (Time, Place, and Person)  Thought Content:  Delusions and Paranoid Ideation rambles  Suicidal Thoughts:  No  Homicidal Thoughts:  No  Memory:  Immediate;   Fair Recent;   Fair Remote;   Fair  Judgement:  Poor  Insight:  Lacking  Psychomotor Activity:  Increased  Concentration:  Concentration: Fair and Attention Span: Fair  Recall:  [employer] of Knowledge:  Fair  Language:  Fair  Akathisia:  No  Handed:  Right  AIMS (if indicated):     Assets:  Communication Skills Desire for Improvement Housing Physical Health Resilience Social Support  ADL's:  Intact  Cognition:  WNL  Sleep:  Number of Hours: 8.25     Treatment Plan Summary: Daily contact with patient to assess and evaluate symptoms and progress in treatment and Medication management   [name] is a 47 year old female with a history of bipolar disorder admitted for psychotic break in the context of medication noncompliance.  She is improving.    #Mood and psychosis -continue Zyprexa 20 mg nightly -continue Abilify maintena injections, next on 02/01/2017 -lower depakote to 500 mg nightly, level on 1/9 is 94, Ammonia 52  #Metabolic syndrome monitoring -Lipid panel, TSH and HgbA1C arenormal -EKG, QTc 438 -pregnancy test is  negative  #Social -patient reluctantly agreed to family contact -SW made CPS referral as we are unsure if her minor kids are taken care of -patient has divorce case in court on 2/14, she is not ready to appear in court  #Disposition -discharge with family -follow up with [facility]     Jolene Schimke, MD 01/13/2017, 8:25 PM

## 2017-01-13 NOTE — BHH Group Notes (Signed)
LCSW Group Therapy Note 01/13/2017 1:15pm  Type of Therapy and Topic: Group Therapy: Feelings Around Returning Home & Establishing a Supportive Framework and Supporting Oneself When Supports Not Available  Participation Level: Active  Description of Group:  Patients first processed thoughts and feelings about upcoming discharge. These included fears of upcoming changes, lack of change, new living environments, judgements and expectations from others and overall stigma of mental health issues. The group then discussed the definition of a supportive framework, what that looks and feels like, and how do to discern it from an unhealthy non-supportive network. The group identified different types of supports as well as what to do when your family/friends are less than helpful or unavailable  Therapeutic Goals  1. Patient will identify one healthy supportive network that they can use at discharge. 2. Patient will identify one factor of a supportive framework and how to tell it from an unhealthy network. 3. Patient able to identify one coping skill to use when they do not have positive supports from others. 4. Patient will demonstrate ability to communicate their needs through discussion and/or role plays.  Summary of Patient Progress:  Pt engaged during group session. As patients processed their anxiety about discharge and described healthy supports patient shared she feels she is not ready to go home.  Patients identified at least one self-care tool they were willing to use after discharge.   Therapeutic Modalities Cognitive Behavioral Therapy Motivational Interviewing   Cheree Ditto, LCSW 01/13/2017 12:24 PM

## 2017-01-13 NOTE — Progress Notes (Signed)
Received [name] this am in her room, sitting in her chair with her bed stripped of linen. She was compliant with her medications. She denied all of the psychiatric symptoms, but unsure if she feels ready to go home. She was given a clean set of bed linens for her bed. She has been visible in the milieu for meals. No change in staus this PM.

## 2017-01-14 MED ORDER — HALOPERIDOL DECANOATE 100 MG/ML IM SOLN
100.0000 mg | INTRAMUSCULAR | Status: DC
  Administered 2017-01-14: 100 mg via INTRAMUSCULAR
  Filled 2017-01-14: qty 1

## 2017-01-14 NOTE — Progress Notes (Signed)
Patient remained isolative in room but was more alert and oriented. Guarded and brief during conversations with staff. Denying SI/HI.Reported that the medication is helping. Patient presented to the medication room and took her medications voluntarily. Was able to walk to the day room to get a snack. No major concern. Currently ibn bed sleeping. No sign of distress noted. Safety precautions maintained.

## 2017-01-14 NOTE — Progress Notes (Signed)
Patient ID: [name], female   DOB: [birth year], 47 y.o.   MRN: [id number] CSW and Dr. Bary Leriche had familyi meeting with Pt, her son, and father to discuss Pt's progress and family expectations.  At times Pt getting visibly upset, saying things like "I swear to God", "I swear on my mother's grave"   It is agreed upon that Pt has made some progress, however, not as much as necessary for her to be discharged and manage the responsibilities she faces, court, parenting, finding work, Social research officer, government.  So it was agreed that a medication change would be done, starting Haldol dec.  Family was advised to continue to work with [facility] to receive medications for free.  Family seemed supportive of Pt doing well.  Pt will be able to home by sheriff at discharge.  This will assist family as they do not have transportation to pick her up.  Agreed to look towards Friday for discharge if all went smoothly.  Dossie Arbour, LCSW

## 2017-01-14 NOTE — Plan of Care (Signed)
  Progressing Education: Knowledge of [facility] Education information/materials will improve 01/14/2017 2018 - Progressing by Clemens Catholic, RN Safety: Ability to remain free from injury will improve 01/14/2017 2018 - Progressing by Clemens Catholic, RN Spiritual Needs Ability to function at adequate level 01/14/2017 2018 - Progressing by Clemens Catholic, RN [facility] Participation in Recreation Therapeutic Interventions ST[facility]-Other Recreation Therapy Goal (Specify) Description Patient will engage in interactions with peers and staff in a pro-social manner x5 days.   01/14/2017 2018 - Progressing by Clemens Catholic, RN Activity: Sleeping patterns will improve 01/14/2017 2018 - Progressing by Clemens Catholic, RN

## 2017-01-14 NOTE — BHH Group Notes (Signed)
[name] Group Notes:  (Nursing/MHT/Case Management/Adjunct)  Date:  01/14/2017  Time:  4:25 AM  Type of Therapy:  Group Therapy  Participation Level:  Active  Participation Quality:  Appropriate  Affect:  Appropriate  Cognitive:  Appropriate  Insight:  Appropriate  Engagement in Group:  Engaged  Modes of Intervention:  Discussion  Summary of Progress/Problems:  [name] 01/14/2017, 4:25 AM

## 2017-01-14 NOTE — BHH Group Notes (Signed)
[facility] Group Notes:  (Nursing/MHT/Case Management/Adjunct)  Date:  01/14/2017  Time:  11:14 PM  Type of Therapy:  Group Therapy  Participation Level:  Active  Participation Quality:  Supportive  Affect:  Appropriate  Cognitive:  Alert  Insight:  Good  Engagement in Group:  Engaged  Modes of Intervention:  Support  Summary of Progress/Problems:  [name] 01/14/2017, 11:14 PM

## 2017-01-14 NOTE — Progress Notes (Signed)
Recreation Therapy Notes  Date: 01.14.2019  Time: 9:30 am  Location: Craft Room  Behavioral response: Appropriate  Intervention Topic: Self-Esteem  Discussion/Intervention: Group content today was focused on self-esteem. Patient defined self-esteem and where it comes form. The group described reasons self-esteem is important. Individuals stated things that impact self-esteem and positive ways to improve self-esteem. The group participated in the intervention "Collage of Me" where patients were able to create a collage of positive things that makes them who they are. Clinical Observations/Feedback:  Patient came to group and stated socializing with others can help build self-esteem. She participated in the intervention and was social with staff. Patient continues to work toward her goals.  [name] LRT/CTRS         [name] 01/14/2017 11:37 AM

## 2017-01-14 NOTE — Progress Notes (Signed)
Patient continues to be guarded and gives vague answers for any questions.Stated that she is feeling good.Denies SI,HI and AVH.Patient talking to herself in the room.Compliant with medications.Attended groups.Appetite and energy level good.Support and encouragement given.

## 2017-01-14 NOTE — Progress Notes (Signed)
[facility] MD Progress Note  01/14/2017 12:19 PM [name]  MRN:  [id number]  Subjective:  [name] is still very paranoid and hallucinating. She is sitting in her room conversing with the voices. She expected her father to come for a family meeting today and is upset he has not shown up as of yet. When her father and her son finaly arrive that patient is pressing very hard for discharge and is quoting her patient's rights. We all agree in the meeting, that she is still too volatile. The patient agreed to Haldol decanoate, rather than Abilify, monthly injections. She remembers doing well on Haldol while pregnant. This indicates that the patient has been sick with mental illness much longer that we appreciated. She has been compliant with medications in the hospital but her recovery is very slow.   Treatment plan. We will discontinue Abilify maintena and give Haldol decanoate injection 100 mg tonight. Continue Zyprexa 30 mg nightly and Depakote 250 mg BID.   Social/disposition. Her children are taken care of by the grandfather who arrived from [place] and will stay as long as necessary. We will encourage the patient to apply for disability. She will follow up with [facility].  Principal Problem: Bipolar I disorder, current or most recent episode manic, with psychotic features ([place]) Diagnosis:   Patient Active Problem List   Diagnosis Date Noted  . Bipolar I disorder, current or most recent episode manic, with psychotic features ([place]) [F31.2] 12/28/2016    Priority: High  . Bipolar affective disorder, mixed, severe, with psychotic behavior ([place]) [F31.64] 12/28/2016  . 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 Anxiety   . Depression   . Hypertension     Past Surgical History:  Procedure Laterality Date  . CESAREAN SECTION  2002, 2009    Family History:  Family History  Problem Relation Age of Onset  . Breast cancer Mother        Deceased, 2  . Hypertension Mother   . Arthritis Father   . Hypertension Father   . Healthy Son   . Asthma Daughter   . Diabetes type II Daughter    Family Psychiatric  History: none reported Social History:  Social History   Substance and Sexual Activity  Alcohol Use No     Social History   Substance and Sexual Activity  Drug Use No    Social History   Socioeconomic History  . Marital status: Married    Spouse name: None  . Number of children: None  . Years of education: None  . Highest education level: None  Social Needs  . Financial resource strain: None  . Food insecurity - worry: None  . Food insecurity - inability: None  . Transportation needs - medical: None  . Transportation needs - non-medical: None  Occupational History  . None  Tobacco Use  . Smoking status: Never Smoker  . Smokeless tobacco: Never Used  Substance and Sexual Activity  . Alcohol use: No  . Drug use: No  . Sexual activity: None  Other Topics Concern  . None  Social History Narrative   She lives at home with 2 sons (92, 48) and 1 daughter (13).   She is currently not working.  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  She moved from [place]  in 2013.   Additional Social History:                         Sleep: Fair  Appetite:  Fair  Current Medications: Current Facility-Administered Medications  Medication Dose Route Frequency Provider Last Rate Last Dose  . acetaminophen (TYLENOL) tablet 650 mg  650 mg Oral Q4H PRN [name], NP      . alum & mag hydroxide-simeth (MAALOX/MYLANTA) 200-200-20 MG/5ML suspension 30 mL  30 mL Oral Q6H PRN [name], NP      . ARIPiprazole ER SRER 400 mg  400 mg Intramuscular Q28 days Taishawn Smaldone B, MD   400 mg at 01/04/17 1022  . divalproex (DEPAKOTE) DR tablet 250 mg  250 mg  Oral Q12H Necie Wilcoxson B, MD   250 mg at 01/14/17 0900  . docusate sodium (COLACE) capsule 100 mg  100 mg Oral Q breakfast [name], NP   100 mg at 01/14/17 0900  . ferrous sulfate tablet 325 mg  325 mg Oral Q breakfast [name], NP   325 mg at 01/14/17 0900  . hydrOXYzine (ATARAX/VISTARIL) tablet 25 mg  25 mg Oral TID PRN Takao Lizer B, MD      . magnesium hydroxide (MILK OF MAGNESIA) suspension 30 mL  30 mL Oral Daily PRN [name], NP      . OLANZapine (ZYPREXA) tablet 30 mg  30 mg Oral QHS Breianna Delfino B, MD   30 mg at 01/13/17 2121  . traZODone (DESYREL) tablet 100 mg  100 mg Oral QHS PRN Darielle Hancher B, MD   100 mg at 01/12/17 2125    Lab Results: No results found for this or any previous visit (from the past 48 hour(s)).  Blood Alcohol level:  Lab Results  Component Value Date   ETH <10 12/27/2016   ETH  03/22/2007    <5        LOWEST DETECTABLE LIMIT FOR SERUM ALCOHOL IS 11 mg/dL FOR MEDICAL PURPOSES ONLY    Metabolic Disorder Labs: Lab Results  Component Value Date   HGBA1C 5.1 12/29/2016   MPG 99.67 12/29/2016   No results found for: PROLACTIN Lab Results  Component Value Date   CHOL 147 12/29/2016   TRIG 43 12/29/2016   HDL 54 12/29/2016   CHOLHDL 2.7 12/29/2016   VLDL 9 12/29/2016   LDLCALC 84 12/29/2016    Physical Findings: AIMS: Facial and Oral Movements Muscles of Facial Expression: None, normal Lips and Perioral Area: None, normal Jaw: None, normal Tongue: None, normal,Extremity Movements Upper (arms, wrists, hands, fingers): None, normal Lower (legs, knees, ankles, toes): None, normal, Trunk Movements Neck, shoulders, hips: None, normal, Overall Severity Severity of abnormal movements (highest score from questions above): None, normal Incapacitation due to abnormal movements: None, normal Patient's awareness of abnormal movements (rate only patient's report): No Awareness, Dental Status Current  problems with teeth and/or dentures?: No Does patient usually wear dentures?: No  CIWA:    COWS:     Musculoskeletal: Strength & Muscle Tone: within normal limits Gait & Station: normal Patient leans: N/A  Psychiatric Specialty Exam: Physical Exam  Vitals reviewed. Psychiatric: Her speech is normal. Her affect is blunt. She is withdrawn and actively hallucinating. Thought content is paranoid and delusional. Cognition and memory are normal. She expresses impulsivity.    Review of Systems  Neurological: Negative.   Psychiatric/Behavioral: Positive for hallucinations.  All other systems reviewed and  are negative.   Blood pressure 126/85, pulse (!) 108, temperature 97.7 F (36.5 C), temperature source Oral, resp. rate 16, height 5\' 7"  (1.702 m), weight 72.6 kg (160 lb), SpO2 100 %.Body mass index is 25.06 kg/m.  General Appearance: Casual  Eye Contact:  Fair  Speech:  Clear and Coherent and Pressured  Volume:  Increased  Mood:  Angry, Dysphoric and Irritable  Affect:  Congruent  Thought Process:  Goal Directed and Descriptions of Associations: Tangential  Orientation:  Full (Time, Place, and Person)  Thought Content:  Delusions, Hallucinations: Auditory and Paranoid Ideation  Suicidal Thoughts:  No  Homicidal Thoughts:  No  Memory:  Immediate;   Fair Recent;   Fair Remote;   Fair  Judgement:  Poor  Insight:  Lacking  Psychomotor Activity:  Normal  Concentration:  Concentration: Fair and Attention Span: Fair  Recall:  [employer] of Knowledge:  Fair  Language:  Fair  Akathisia:  No  Handed:  Right  AIMS (if indicated):     Assets:  Communication Skills Desire for Improvement Physical Health Resilience Social Support  ADL's:  Intact  Cognition:  WNL  Sleep:  Number of Hours: 7     Treatment Plan Summary: Daily contact with patient to assess and evaluate symptoms and progress in treatment and Medication management   Ms. Simek is a 47 year old female with a  history of bipolar disorder admitted for psychotic break in the context of medication noncompliance.  #Mood and psychosis, minimal improvement -continue Zyprexa 30 mg nightly -discontinue Abilify maintena  -start Haldol decanoate injections 100 mg monthly -continue 462 mg BID  #Metabolic syndrome monitoring -Lipid panel, TSH and HgbA1C arenormal -EKG, QTc 438 -pregnancy test is negative  #Social -family meeting completed -disability application recommended -ACT team recommended -pharmacy assistance necessary -letter to court  #Disposition -discharge with family -follow up with [facility]       Orson Slick, MD 01/14/2017, 12:19 PM

## 2017-01-14 NOTE — Progress Notes (Signed)
Patient report that she is successful taken her medicines and feeling much better and had reduced  anxiety and depression, safety is maintained 15 minute check is in progress no distress noted.

## 2017-01-14 NOTE — Plan of Care (Signed)
Patient is had more interactions with staff but remains brief  and guarded. Taking medications and eating her meals.

## 2017-01-14 NOTE — BHH Group Notes (Signed)
01/14/2017 1PM  Type of Therapy and Topic:  Group Therapy:  Overcoming Obstacles  Participation Level:  Active    Description of Group:    In this group patients will be encouraged to explore what they see as obstacles to their own wellness and recovery. They will be guided to discuss their thoughts, feelings, and behaviors related to these obstacles. The group will process together ways to cope with barriers, with attention given to specific choices patients can make. Each patient will be challenged to identify changes they are motivated to make in order to overcome their obstacles. This group will be process-oriented, with patients participating in exploration of their own experiences as well as giving and receiving support and challenge from other group members.   Therapeutic Goals: 1. Patient will identify personal and current obstacles as they relate to admission. 2. Patient will identify barriers that currently interfere with their wellness or overcoming obstacles.  3. Patient will identify feelings, thought process and behaviors related to these barriers. 4. Patient will identify two changes they are willing to make to overcome these obstacles:      Summary of Patient Progress Actively and appropriately engaged in the group. Patient was able to provide support and validation to other group members.Patient practiced active listening when interacting with the facilitator and other group members Patient in still in the process of obtaining treatment goals. Sukaina says that her obstacle is that her family told her that she was breaking down. She says that she can address this by taking her medications and doing activities that can help her such as going to church.     Therapeutic Modalities:   Cognitive Behavioral Therapy Solution Focused Therapy Motivational Interviewing Relapse Prevention Therapy    Darin Engels MSW, [place] 01/14/2017 1:58 PM

## 2017-01-14 NOTE — Plan of Care (Signed)
Patient medications and group compliant.Safety maintained in the unit.

## 2017-01-15 NOTE — Progress Notes (Signed)
Recreation Therapy Notes  Date: 01.15.2019  Time: 9:30 am  Location: Craft Room  Behavioral response: Appropriate  Intervention Topic: Coping skills  Discussion/Intervention: Group content on today was focused on coping skills. The group defined what coping skills are and when they can be used. Individuals described how they normally cope with thing and the coping skills they normally use. Patients expressed why it is important to cope with things and how not coping with things can affect you. The group participated in the intervention "My coping box" and made coping boxes while adding coping skills they could use in the future to the box. Clinical Observations/Feedback:  Patient came to group identified reading as a coping skill she uses. She stated that coping skills are imporotant because it helps control your mood and helps stabilize you. Patient identified 3 new coping skills (draw a picture, identify positive thoughts, take deep breaths) at the end of group that she could use post discharge.  [name] LRT/CTRS        [name] 01/15/2017 10:35 AM

## 2017-01-15 NOTE — Progress Notes (Signed)
[facility] MD Progress Note  01/15/2017 1:58 PM [name]  MRN:  [id number]  Subjective:   [name] is cool and collected today. [name] has no complaints except the pain all over her body from Haldol injection last night. [name] is asking for Tylenol. [name] takes medications and attends classesconsistently. [name] is "at her best behavior" to be discharged on Friday.  Treatment plan. We will continue monthly Haldol injections, oral Zyprexa and low dose Depakote for psychosis and mood stabilization.  Social/disposition. [name] will be discharged with family. Follow up with [facility].   Principal Problem: Bipolar I disorder, current or most recent episode manic, with psychotic features ([place]) Diagnosis:   Patient Active Problem List   Diagnosis Date Noted  . Bipolar I disorder, current or most recent episode manic, with psychotic features ([place]) [F31.2] 12/28/2016    Priority: High  . Bipolar affective disorder, mixed, severe, with psychotic behavior ([place]) [F31.64] 12/28/2016  . 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 Anxiety   . Depression   . Hypertension     Past Surgical History:  Procedure Laterality Date  . CESAREAN SECTION  2002, 2009   Family History:  Family History  Problem Relation Age of Onset  . Breast cancer Mother        Deceased, 83  . Hypertension Mother   . Arthritis Father   . Hypertension Father   . Healthy Son   . Asthma Daughter   . Diabetes type II Daughter    Family Psychiatric  History: none reported Social History:  Social History   Substance and Sexual Activity  Alcohol Use No     Social History   Substance and Sexual Activity  Drug Use No    Social History   Socioeconomic History  . Marital status: Married    Spouse name: None  . Number of children: None  .  Years of education: None  . Highest education level: None  Social Needs  . Financial resource strain: None  . Food insecurity - worry: None  . Food insecurity - inability: None  . Transportation needs - medical: None  . Transportation needs - non-medical: None  Occupational History  . None  Tobacco Use  . Smoking status: Never Smoker  . Smokeless tobacco: Never Used  Substance and Sexual Activity  . Alcohol use: No  . Drug use: No  . Sexual activity: None  Other Topics Concern  . None  Social History Narrative   [name] lives at home with 2 sons (41, 49) and 1 daughter (74).   [name] is currently not working.  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  [name] moved from [place] in 2013.   Additional Social History:                         Sleep: Fair  Appetite:  Fair  Current Medications: Current Facility-Administered Medications  Medication Dose Route Frequency Provider Last Rate Last Dose  . acetaminophen (TYLENOL) tablet 650 mg  650 mg Oral Q4H PRN [name], NP      . alum & mag hydroxide-simeth (MAALOX/MYLANTA) 200-200-20 MG/5ML suspension 30 mL  30 mL Oral Q6H PRN [name], NP      . divalproex (DEPAKOTE) DR tablet 250  mg  250 mg Oral Q12H Chaston Bradburn B, MD   250 mg at 01/15/17 0831  . docusate sodium (COLACE) capsule 100 mg  100 mg Oral Q breakfast [name], NP   100 mg at 01/15/17 0831  . ferrous sulfate tablet 325 mg  325 mg Oral Q breakfast [name], NP   325 mg at 01/15/17 0831  . haloperidol decanoate (HALDOL DECANOATE) 100 MG/ML injection 100 mg  100 mg Intramuscular Q28 days Xzavion Doswell B, MD   100 mg at 01/14/17 2205  . hydrOXYzine (ATARAX/VISTARIL) tablet 25 mg  25 mg Oral TID PRN Betrice Wanat B, MD      . magnesium hydroxide (MILK OF MAGNESIA) suspension 30 mL  30 mL Oral Daily PRN [name], NP      . OLANZapine (ZYPREXA) tablet 30 mg  30 mg Oral QHS  Tuan Tippin B, MD   30 mg at 01/14/17 2118  . traZODone (DESYREL) tablet 100 mg  100 mg Oral QHS PRN Charday Capetillo B, MD   100 mg at 01/12/17 2125    Lab Results: No results found for this or any previous visit (from the past 48 hour(s)).  Blood Alcohol level:  Lab Results  Component Value Date   ETH <10 12/27/2016   ETH  03/22/2007    <5        LOWEST DETECTABLE LIMIT FOR SERUM ALCOHOL IS 11 mg/dL FOR MEDICAL PURPOSES ONLY    Metabolic Disorder Labs: Lab Results  Component Value Date   HGBA1C 5.1 12/29/2016   MPG 99.67 12/29/2016   No results found for: PROLACTIN Lab Results  Component Value Date   CHOL 147 12/29/2016   TRIG 43 12/29/2016   HDL 54 12/29/2016   CHOLHDL 2.7 12/29/2016   VLDL 9 12/29/2016   LDLCALC 84 12/29/2016    Physical Findings: AIMS: Facial and Oral Movements Muscles of Facial Expression: None, normal Lips and Perioral Area: None, normal Jaw: None, normal Tongue: None, normal,Extremity Movements Upper (arms, wrists, hands, fingers): None, normal Lower (legs, knees, ankles, toes): None, normal, Trunk Movements Neck, shoulders, hips: None, normal, Overall Severity Severity of abnormal movements (highest score from questions above): None, normal Incapacitation due to abnormal movements: None, normal Patient's awareness of abnormal movements (rate only patient's report): No Awareness, Dental Status Current problems with teeth and/or dentures?: No Does patient usually wear dentures?: No  CIWA:    COWS:     Musculoskeletal: Strength & Muscle Tone: within normal limits Gait & Station: normal Patient leans: N/A  Psychiatric Specialty Exam: Physical Exam  Nursing note and vitals reviewed. Psychiatric: [name] has a normal mood and affect. Her speech is normal. [name] is actively hallucinating. Thought content is paranoid and delusional. Cognition and memory are normal. [name] expresses impulsivity.    Review of Systems  Neurological:  Negative.   Psychiatric/Behavioral: Positive for hallucinations.  All other systems reviewed and are negative.   Blood pressure 123/70, pulse 83, temperature 98.1 F (36.7 C), temperature source Oral, resp. rate 18, height 5\' 7"  (1.702 m), weight 72.6 kg (160 lb), SpO2 98 %.Body mass index is 25.06 kg/m.  General Appearance: Casual  Eye Contact:  Good  Speech:  Clear and Coherent  Volume:  Normal  Mood:  Dysphoric  Affect:  Congruent  Thought Process:  Goal Directed and Descriptions of Associations: Intact  Orientation:  Full (Time, Place, and Person)  Thought Content:  Delusions, Hallucinations: Auditory and Paranoid Ideation  Suicidal Thoughts:  No  Homicidal Thoughts:  No  Memory:  Immediate;   Fair Recent;   Fair Remote;   Fair  Judgement:  Poor  Insight:  Lacking  Psychomotor Activity:  Normal  Concentration:  Concentration: Fair and Attention Span: Fair  Recall:  [employer] of Knowledge:  Fair  Language:  Fair  Akathisia:  No  Handed:  Right  AIMS (if indicated):     Assets:  Communication Skills Desire for Improvement Housing Physical Health Resilience Social Support  ADL's:  Intact  Cognition:  WNL  Sleep:  Number of Hours: 7     Treatment Plan Summary: Daily contact with patient to assess and evaluate symptoms and progress in treatment and Medication management   [name] is a 47 year old female with a history of bipolar disorder admitted for psychotic break in the context of medication noncompliance.  #Mood and psychosis, minimal improvement -continue Zyprexa 30 mg nightly -continue Haldol decanoate injections 100 mg monthly, next dose on 2/11 -continue Depakote 594 mg BID  #Metabolic syndrome monitoring -Lipid panel, TSH and HgbA1C arenormal -EKG, QTc 438 -pregnancy test is negative  #Social -family meeting completed -disability application recommended -ACT team recommended -pharmacy assistance necessary -letter to  court  #Disposition -discharge with family -follow up with [facility]     Orson Slick, MD 01/15/2017, 1:58 PM

## 2017-01-15 NOTE — Plan of Care (Signed)
Patient is alert and oriented  X 4. Patient denies SI, HI and AVH. Patient affect is very pleasant and with high religiosity. Patient is compliant with medications. Nurse will continue to monitor patient. Education: Will be free of psychotic symptoms 01/15/2017 1450 - Progressing by Geraldo Docker, RN Knowledge of the prescribed therapeutic regimen will improve 01/15/2017 1450 - Progressing by Geraldo Docker, RN   Education: Knowledge of [place] Education information/materials will improve 01/15/2017 1450 - Progressing by Geraldo Docker, RN   Activity: Sleeping patterns will improve 01/15/2017 1450 - Progressing by Geraldo Docker, RN

## 2017-01-15 NOTE — BHH Group Notes (Signed)
01/15/2017 1PM  Type of Therapy/Topic:  Group Therapy:  Feelings about Diagnosis  Participation Level:  Active   Description of Group:   This group will allow patients to explore their thoughts and feelings about diagnoses they have received. Patients will be guided to explore their level of understanding and acceptance of these diagnoses. Facilitator will encourage patients to process their thoughts and feelings about the reactions of others to their diagnosis and will guide patients in identifying ways to discuss their diagnosis with significant others in their lives. This group will be process-oriented, with patients participating in exploration of their own experiences, giving and receiving support, and processing challenge from other group members.   Therapeutic Goals: 1. Patient will demonstrate understanding of diagnosis as evidenced by identifying two or more symptoms of the disorder 2. Patient will be able to express two feelings regarding the diagnosis 3. Patient will demonstrate their ability to communicate their needs through discussion and/or role play  Summary of Patient Progress: Pt continues to work towards their tx goals but has not yet reached them. Pt was able to appropriately participate in group discussion, and was able to offer support/validation to other group members. Pt reported feeling, "okay--I could be discharged on Friday so I am happy about that." Pt reported, "medically, I agree with my diagnosis. But, on another deminsion, I don't. I wonder what I did to become a psychiatric patient. I wasn't born to be a psychiatric patient."  Pt was tearful during discussion around her diagnosis, but was able to continue appropriately participating in group conversations.   Therapeutic Modalities:   Cognitive Behavioral Therapy Brief Therapy  Alden Hipp, MSW, LCSW 01/15/2017 1:38 PM

## 2017-01-16 NOTE — BHH Group Notes (Signed)
[place] Group Notes:  (Nursing/MHT/Case Management/Adjunct)  Date:  01/16/2017  Time:  2:26 AM  Type of Therapy:  Group Therapy  Participation Level:  Active  Participation Quality:  Appropriate  Affect:  Appropriate  Cognitive:  Appropriate  Insight:  Appropriate  Engagement in Group:  Engaged  Modes of Intervention:  Discussion  Summary of Progress/Problems:  [name] 01/16/2017, 2:26 AM

## 2017-01-16 NOTE — Plan of Care (Signed)
  Safety: Ability to remain free from injury will improve 01/16/2017 2220 - Progressing by Providence Crosby, RN Note Pt safe on the unit at this time

## 2017-01-16 NOTE — Progress Notes (Signed)
Patient is alert and oriented  X 4. Patient denies SI, HI and AVH. Patient affect is very pleasant and with high religiosity. Patient is compliant with medications. Nurse will continue to monitor patient.

## 2017-01-16 NOTE — Plan of Care (Signed)
Patient is alert and oriented  X 4. Patient denies SI, HI and AVH. Patient affect is very pleasant and with high religiosity. Patient is compliant with medications. Nurse will continue to monitor patient.

## 2017-01-16 NOTE — Progress Notes (Signed)
Patient found in day room upon my arrival. Patient is visible but not social throughout the evening. Denies all complaints. Compliant with HS medications and staff direction. Q 15 minute checks maintained. Will continue to monitor throughout the shift.

## 2017-01-16 NOTE — Progress Notes (Signed)
D: Pt denies SI/HI/AVH. Pt is pleasant and cooperative. Pt presents depressed, pt stated she was doing better due to her medications.   A: Pt was offered support and encouragement. Pt was given scheduled medications. Pt was encourage to attend groups. Q 15 minute checks were done for safety.   R: Pt is taking medication. Pt has no complaints.Pt receptive to treatment and safety maintained on unit.

## 2017-01-16 NOTE — Progress Notes (Signed)
[facility] MD Progress Note  01/16/2017 7:12 PM Honour Schwieger  MRN:  [id number]  Subjective:   Ms. Imel is slightly tearful today but also much calmer and sensible. Anticipated discharge Friday. No complaints, no side effects. We will try to get ACT team.  Treatment plan. We will continue monthly Haldol injections, oral Zyprexa and low dose Depakote for psychosis and mood stabilization.  Social/disposition. She will be discharged with family. Follow up with [facility].   Principal Problem: Bipolar I disorder, current or most recent episode manic, with psychotic features ([place]) Diagnosis:   Patient Active Problem List   Diagnosis Date Noted  . Bipolar I disorder, current or most recent episode manic, with psychotic features ([place]) [F31.2] 12/28/2016    Priority: High  . Bipolar affective disorder, mixed, severe, with psychotic behavior ([place]) [F31.64] 12/28/2016  . 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 Anxiety   . Depression   . Hypertension     Past Surgical History:  Procedure Laterality Date  . CESAREAN SECTION  2002, 2009   Family History:  Family History  Problem Relation Age of Onset  . Breast cancer Mother        Deceased, 64  . Hypertension Mother   . Arthritis Father   . Hypertension Father   . Healthy Son   . Asthma Daughter   . Diabetes type II Daughter    Family Psychiatric  History: none reported Social History:  Social History   Substance and Sexual Activity  Alcohol Use No     Social History   Substance and Sexual Activity  Drug Use No    Social History   Socioeconomic History  . Marital status: Married    Spouse name: None  . Number of children: None  . Years of education: None  . Highest education level: None  Social Needs  . Financial resource strain: None   . Food insecurity - worry: None  . Food insecurity - inability: None  . Transportation needs - medical: None  . Transportation needs - non-medical: None  Occupational History  . None  Tobacco Use  . Smoking status: Never Smoker  . Smokeless tobacco: Never Used  Substance and Sexual Activity  . Alcohol use: No  . Drug use: No  . Sexual activity: None  Other Topics Concern  . None  Social History Narrative   She lives at home with 2 sons (29, 62) and 1 daughter (76).   She is currently not working.  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  She moved from [place] in 2013.   Additional Social History:                         Sleep: Fair  Appetite:  Fair  Current Medications: Current Facility-Administered Medications  Medication Dose Route Frequency Provider Last Rate Last Dose  . acetaminophen (TYLENOL) tablet 650 mg  650 mg Oral Q4H PRN [name], NP      . alum & mag hydroxide-simeth (MAALOX/MYLANTA) 200-200-20 MG/5ML suspension 30 mL  30 mL Oral Q6H PRN [name], NP      . divalproex (DEPAKOTE) DR tablet 250 mg  250 mg Oral Q12H Roshan Salamon B, MD   250 mg at 01/16/17 0823  .  docusate sodium (COLACE) capsule 100 mg  100 mg Oral Q breakfast [name], NP   100 mg at 01/16/17 9798  . ferrous sulfate tablet 325 mg  325 mg Oral Q breakfast [name], NP   325 mg at 01/16/17 9211  . haloperidol decanoate (HALDOL DECANOATE) 100 MG/ML injection 100 mg  100 mg Intramuscular Q28 days Saralynn Langhorst B, MD   100 mg at 01/14/17 2205  . hydrOXYzine (ATARAX/VISTARIL) tablet 25 mg  25 mg Oral TID PRN Percell Lamboy B, MD      . magnesium hydroxide (MILK OF MAGNESIA) suspension 30 mL  30 mL Oral Daily PRN [name], NP      . OLANZapine (ZYPREXA) tablet 30 mg  30 mg Oral QHS Kama Cammarano B, MD   30 mg at 01/15/17 2008  . traZODone (DESYREL) tablet 100 mg  100 mg Oral QHS PRN  Seibert Keeter B, MD   100 mg at 01/12/17 2125    Lab Results: No results found for this or any previous visit (from the past 48 hour(s)).  Blood Alcohol level:  Lab Results  Component Value Date   ETH <10 12/27/2016   ETH  03/22/2007    <5        LOWEST DETECTABLE LIMIT FOR SERUM ALCOHOL IS 11 mg/dL FOR MEDICAL PURPOSES ONLY    Metabolic Disorder Labs: Lab Results  Component Value Date   HGBA1C 5.1 12/29/2016   MPG 99.67 12/29/2016   No results found for: PROLACTIN Lab Results  Component Value Date   CHOL 147 12/29/2016   TRIG 43 12/29/2016   HDL 54 12/29/2016   CHOLHDL 2.7 12/29/2016   VLDL 9 12/29/2016   LDLCALC 84 12/29/2016    Physical Findings: AIMS: Facial and Oral Movements Muscles of Facial Expression: None, normal Lips and Perioral Area: None, normal Jaw: None, normal Tongue: None, normal,Extremity Movements Upper (arms, wrists, hands, fingers): None, normal Lower (legs, knees, ankles, toes): None, normal, Trunk Movements Neck, shoulders, hips: None, normal, Overall Severity Severity of abnormal movements (highest score from questions above): None, normal Incapacitation due to abnormal movements: None, normal Patient's awareness of abnormal movements (rate only patient's report): No Awareness, Dental Status Current problems with teeth and/or dentures?: No Does patient usually wear dentures?: No  CIWA:    COWS:     Musculoskeletal: Strength & Muscle Tone: within normal limits Gait & Station: normal Patient leans: N/A  Psychiatric Specialty Exam: Physical Exam  Nursing note and vitals reviewed. Psychiatric: She has a normal mood and affect. Her speech is normal. She is actively hallucinating. Thought content is paranoid and delusional. Cognition and memory are normal. She expresses impulsivity.    Review of Systems  Neurological: Negative.   Psychiatric/Behavioral: Positive for hallucinations.  All other systems reviewed and are negative.    Blood pressure (!) 112/51, pulse 80, temperature 97.7 F (36.5 C), temperature source Oral, resp. rate 20, height 5\' 7"  (1.702 m), weight 72.6 kg (160 lb), SpO2 100 %.Body mass index is 25.06 kg/m.  General Appearance: Casual  Eye Contact:  Good  Speech:  Clear and Coherent  Volume:  Normal  Mood:  Dysphoric  Affect:  Congruent  Thought Process:  Goal Directed and Descriptions of Associations: Intact  Orientation:  Full (Time, Place, and Person)  Thought Content:  Delusions, Hallucinations: Auditory and Paranoid Ideation  Suicidal Thoughts:  No  Homicidal Thoughts:  No  Memory:  Immediate;   Fair Recent;   Fair Remote;  Fair  Judgement:  Poor  Insight:  Lacking  Psychomotor Activity:  Normal  Concentration:  Concentration: Fair and Attention Span: Fair  Recall:  [employer] of Knowledge:  Fair  Language:  Fair  Akathisia:  No  Handed:  Right  AIMS (if indicated):     Assets:  Communication Skills Desire for Improvement Housing Physical Health Resilience Social Support  ADL's:  Intact  Cognition:  WNL  Sleep:  Number of Hours: 8.25     Treatment Plan Summary: Daily contact with patient to assess and evaluate symptoms and progress in treatment and Medication management   Ms. Bubolz is a 47 year old female with a history of bipolar disorder admitted for psychotic break in the context of medication noncompliance.  #Mood and psychosis, minimal improvement -continue Zyprexa 30 mg nightly -continue Haldol decanoate injections 100 mg monthly, next dose on 2/11 -continue Depakote 749 mg BID  #Metabolic syndrome monitoring -Lipid panel, TSH and HgbA1C arenormal -EKG, QTc 438 -pregnancy test is negative  #Social -family meeting completed -disability application recommended -ACT team recommended -pharmacy assistance necessary -letter to court  #Disposition -discharge with family -follow up with [facility]     Orson Slick, MD 01/16/2017, 7:12 PM

## 2017-01-16 NOTE — B[facility] Group Notes (Signed)
01/16/2017 9:30AM  Type of Therapy/Topic:  Group Therapy:  Emotion Regulation  Participation Level:  Active   Description of Group:   The purpose of this group is to assist patients in learning to regulate negative emotions and experience positive emotions. Patients will be guided to discuss ways in which they have been vulnerable to their negative emotions. These vulnerabilities will be juxtaposed with experiences of positive emotions or situations, and patients will be challenged to use positive emotions to combat negative ones. Special emphasis will be placed on coping with negative emotions in conflict situations, and patients will process healthy conflict resolution skills.  Therapeutic Goals: 1. Patient will identify two positive emotions or experiences to reflect on in order to balance out negative emotions 2. Patient will label two or more emotions that they find the most difficult to experience 3. Patient will demonstrate positive conflict resolution skills through discussion and/or role plays  Summary of Patient Progress: Actively and appropriately engaged in the group. Patient was able to provide support and validation to other group members.Patient practiced active listening when interacting with the facilitator and other group members Patient in still in the process of obtaining treatment goals. [name] says that she is feeling good today because she is getting ready to be discharged home Friday. She mentions how prayer and meditation help her with managing her emotions.      Therapeutic Modalities:   Cognitive Behavioral Therapy Feelings Identification Dialectical Behavioral Therapy   Darin Engels, [place] 01/16/2017 10:23 AM

## 2017-01-16 NOTE — Plan of Care (Signed)
  Progressing Education: Will be free of psychotic symptoms 01/16/2017 0115 - Progressing by Derek Mound, RN Knowledge of the prescribed therapeutic regimen will improve 01/16/2017 0115 - Progressing by Derek Mound, RN Education: Knowledge of [place] Education information/materials will improve 01/16/2017 0115 - Progressing by Derek Mound, RN Safety: Ability to remain free from injury will improve 01/16/2017 0115 - Progressing by Derek Mound, RN Spiritual Needs Ability to function at adequate level 01/16/2017 0115 - Progressing by Derek Mound, RN [facility] Participation in Recreation Therapeutic Interventions STG-Other Recreation Therapy Goal (Specify) Description Patient will engage in interactions with peers and staff in a pro-social manner x5 days.   01/16/2017 0115 - Progressing by Derek Mound, RN Activity: Sleeping patterns will improve 01/16/2017 0115 - Progressing by Derek Mound, RN

## 2017-01-16 NOTE — Tx Team (Signed)
Interdisciplinary Treatment and Diagnostic Plan Update  01/16/2017 Time of Session: 10:50am [name] MRN: [id number]  Principal Diagnosis: Bipolar I disorder, current or most recent episode manic, with psychotic features ([place])  Secondary Diagnoses: Principal Problem:   Bipolar I disorder, current or most recent episode manic, with psychotic features ([place])   Current Medications:  Current Facility-Administered Medications  Medication Dose Route Frequency Provider Last Rate Last Dose  . acetaminophen (TYLENOL) tablet 650 mg  650 mg Oral Q4H PRN [name], NP      . alum & mag hydroxide-simeth (MAALOX/MYLANTA) 200-200-20 MG/5ML suspension 30 mL  30 mL Oral Q6H PRN [name], NP      . divalproex (DEPAKOTE) DR tablet 250 mg  250 mg Oral Q12H [name], [name], MD   250 mg at 01/16/17 0823  . docusate sodium (COLACE) capsule 100 mg  100 mg Oral Q breakfast [name], NP   100 mg at 01/16/17 4008  . ferrous sulfate tablet 325 mg  325 mg Oral Q breakfast [name], NP   325 mg at 01/16/17 6761  . haloperidol decanoate (HALDOL DECANOATE) 100 MG/ML injection 100 mg  100 mg Intramuscular Q28 days [name], [name], MD   100 mg at 01/14/17 2205  . hydrOXYzine (ATARAX/VISTARIL) tablet 25 mg  25 mg Oral TID PRN [name], [name], MD      . magnesium hydroxide (MILK OF MAGNESIA) suspension 30 mL  30 mL Oral Daily PRN [name], NP      . OLANZapine (ZYPREXA) tablet 30 mg  30 mg Oral QHS [name], [name], MD   30 mg at 01/15/17 2008  . traZODone (DESYREL) tablet 100 mg  100 mg Oral QHS PRN [name], [name], MD   100 mg at 01/12/17 2125   PTA Medications: No medications prior to admission.    Patient Stressors: Financial difficulties Medication change or noncompliance  Patient Strengths: Ability for insight Average or above average intelligence Capable of independent living Communication skills Supportive  family/friends  Treatment Modalities: Medication Management, Group therapy, Case management,  1 to 1 session with clinician, Psychoeducation, Recreational therapy.   Physician Treatment Plan for Primary Diagnosis: Bipolar I disorder, current or most recent episode manic, with psychotic features ([place]) Long Term Goal(s): Improvement in symptoms so as ready for discharge NA   Short Term Goals: Ability to identify changes in lifestyle to reduce recurrence of condition will improve Ability to verbalize feelings will improve Ability to disclose and discuss suicidal ideas Ability to demonstrate self-control will improve Ability to identify and develop effective coping behaviors will improve Ability to maintain clinical measurements within normal limits will improve Compliance with prescribed medications will improve Ability to identify triggers associated with substance abuse/mental health issues will improve NA  Medication Management: Evaluate patient's response, side effects, and tolerance of medication regimen.  Therapeutic Interventions: 1 to 1 sessions, Unit Group sessions and Medication administration.  Evaluation of Outcomes: Progressing  Physician Treatment Plan for Secondary Diagnosis: Principal Problem:   Bipolar I disorder, current or most recent episode manic, with psychotic features ([place])  Long Term Goal(s): Improvement in symptoms so as ready for discharge NA   Short Term Goals: Ability to identify changes in lifestyle to reduce recurrence of condition will improve Ability to verbalize feelings will improve Ability to disclose and discuss suicidal ideas Ability to demonstrate self-control will improve Ability to identify and develop effective coping behaviors will improve Ability to maintain clinical measurements within normal  limits will improve Compliance with prescribed medications will improve Ability to identify triggers associated with substance abuse/mental health  issues will improve NA     Medication Management: Evaluate patient's response, side effects, and tolerance of medication regimen.  Therapeutic Interventions: 1 to 1 sessions, Unit Group sessions and Medication administration.  Evaluation of Outcomes: Progressing   RN Treatment Plan for Primary Diagnosis: Bipolar I disorder, current or most recent episode manic, with psychotic features ([place]) Long Term Goal(s): Knowledge of disease and therapeutic regimen to maintain health will improve  Short Term Goals: Ability to identify and develop effective coping behaviors will improve and Compliance with prescribed medications will improve  Medication Management: RN will administer medications as ordered by provider, will assess and evaluate patient's response and provide education to patient for prescribed medication. RN will report any adverse and/or side effects to prescribing provider.  Therapeutic Interventions: 1 on 1 counseling sessions, Psychoeducation, Medication administration, Evaluate responses to treatment, Monitor vital signs and CBGs as ordered, Perform/monitor CIWA, COWS, AIMS and Fall Risk screenings as ordered, Perform wound care treatments as ordered.  Evaluation of Outcomes: Progressing   LCSW Treatment Plan for Primary Diagnosis: Bipolar I disorder, current or most recent episode manic, with psychotic features ([place]) Long Term Goal(s): Safe transition to appropriate next level of care at discharge, Engage patient in therapeutic group addressing interpersonal concerns.  Short Term Goals: Engage patient in aftercare planning with referrals and resources, Identify triggers associated with mental health/substance abuse issues and Increase skills for wellness and recovery  Therapeutic Interventions: Assess for all discharge needs, 1 to 1 time with Social worker, Explore available resources and support systems, Assess for adequacy in community support network, Educate family and  significant other(s) on suicide prevention, Complete Psychosocial Assessment, Interpersonal group therapy.  Evaluation of Outcomes: Progressing   Progress in Treatment: Attending groups: Yes. Participating in groups: Yes. Taking medication as prescribed: Yes. Toleration medication: Yes. Family/Significant other contact made: Yes, individual(s) contacted:  Pt's son, Oley Balm was contacted along with her father.D Patient understands diagnosis: Yes. Discussing patient identified problems/goals with staff: Yes. Medical problems stabilized or resolved: Yes. Denies suicidal/homicidal ideation: Yes. Issues/concerns per patient self-inventory: No. Other:    New problem(s) identified: No, Describe:     New Short Term/Long Term Goal(s): To continue to improve and focus on her recovery  Discharge Plan or Barriers: discharge home with family and follow up with outpatient provider Beverly Sessions)  Reason for Continuation of Hospitalization: Mania Medication stabilization, psychotic features-religiously preoccupied, paranoia  Estimated Length of Stay: 1-2 days  Recreational Therapy: Patient Stressors: None. Patient Goal: Patient will engage in interactions with peers and staff in a pro-social manner x5 days.   Attendees: Patient: 01/16/2017 4:32 PM  Physician: Orson Slick, MD 01/16/2017 4:32 PM  Nursing:Gwen Rudie Meyer, RN 01/16/2017 4:32 PM  RN Care Manager: 01/16/2017 4:32 PM  Social Worker: Dossie Arbour, LCSW 01/16/2017 4:32 PM  Recreational Therapist: 01/16/2017 4:32 PM  Other:  01/16/2017 4:32 PM  Other:  01/16/2017 4:32 PM  Other: 01/16/2017 4:32 PM    Scribe for Treatment Team: August Saucer, LCSW 01/16/2017 4:32 PM

## 2017-01-17 MED ORDER — DIVALPROEX SODIUM 250 MG PO DR TAB: 250.0000 mg | DELAYED_RELEASE_TABLET | Freq: Two times a day (BID) | ORAL | 1 refills | Status: DC

## 2017-01-17 MED ORDER — OLANZAPINE 15 MG PO TABS: 30.0000 mg | ORAL_TABLET | Freq: Every day | ORAL | 1 refills | Status: DC

## 2017-01-17 MED ORDER — HALOPERIDOL DECANOATE 100 MG/ML IM SOLN: 100.0000 mg | INTRAMUSCULAR | 1 refills | Status: DC

## 2017-01-17 NOTE — Progress Notes (Signed)
[facility] MD Progress Note  01/17/2017 2:48 PM [name]  MRN:  [id number]  Subjective:   [name] has much improved. [name] is cool and collected. Accepts and tolerates medications well. [name] feels ready for discharge tomorrow. [name] denies hallucinations, still slightly paranoid.  Spoke with [name] family again and with [facility] not to dispense any medications send there electronically as the patient needs free pharmacy.  Treatment plan. [name] will continue Haldol decanoate injections, depakote and Zyprexa.  Social/disposition. [name] will return home by the sheriff, follow up with [facility].  Principal Problem: Bipolar I disorder, current or most recent episode manic, with psychotic features ([place]) Diagnosis:   Patient Active Problem List   Diagnosis Date Noted  . Bipolar I disorder, current or most recent episode manic, with psychotic features ([place]) [F31.2] 12/28/2016    Priority: High  . Bipolar affective disorder, mixed, severe, with psychotic behavior ([place]) [F31.64] 12/28/2016  . 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 Anxiety   . Depression   . Hypertension     Past Surgical History:  Procedure Laterality Date  . CESAREAN SECTION  2002, 2009   Family History:  Family History  Problem Relation Age of Onset  . Breast cancer Mother        Deceased, 73  . Hypertension Mother   . Arthritis Father   . Hypertension Father   . Healthy Son   . Asthma Daughter   . Diabetes type II Daughter    Family Psychiatric  History: none reported Social History:  Social History   Substance and Sexual Activity  Alcohol Use No     Social History   Substance and Sexual Activity  Drug Use No    Social History   Socioeconomic History  . Marital status: Married    Spouse name: None  . Number of children: None  . Years of  education: None  . Highest education level: None  Social Needs  . Financial resource strain: None  . Food insecurity - worry: None  . Food insecurity - inability: None  . Transportation needs - medical: None  . Transportation needs - non-medical: None  Occupational History  . None  Tobacco Use  . Smoking status: Never Smoker  . Smokeless tobacco: Never Used  Substance and Sexual Activity  . Alcohol use: No  . Drug use: No  . Sexual activity: None  Other Topics Concern  . None  Social History Narrative   [name] lives at home with 2 sons (50, 86) and 1 daughter (22).   [name] is currently not working.  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  [name] moved from [place] in 2013.   Additional Social History:                         Sleep: Fair  Appetite:  Fair  Current Medications: Current Facility-Administered Medications  Medication Dose Route Frequency Provider Last Rate Last Dose  . acetaminophen (TYLENOL) tablet 650 mg  650 mg Oral Q4H PRN [name], NP      . alum & mag hydroxide-simeth (MAALOX/MYLANTA) 200-200-20 MG/5ML suspension 30 mL  30 mL Oral Q6H PRN [name], NP      . divalproex (DEPAKOTE) DR tablet 250 mg  250 mg Oral  Q12H Shakeeta Godette, Herma Ard B, MD   250 mg at 01/17/17 0837  . docusate sodium (COLACE) capsule 100 mg  100 mg Oral Q breakfast [name], NP   100 mg at 01/17/17 0837  . ferrous sulfate tablet 325 mg  325 mg Oral Q breakfast [name], NP   325 mg at 01/17/17 0837  . haloperidol decanoate (HALDOL DECANOATE) 100 MG/ML injection 100 mg  100 mg Intramuscular Q28 days Marcella Dunnaway B, MD   100 mg at 01/14/17 2205  . hydrOXYzine (ATARAX/VISTARIL) tablet 25 mg  25 mg Oral TID PRN Himani Corona B, MD      . magnesium hydroxide (MILK OF MAGNESIA) suspension 30 mL  30 mL Oral Daily PRN [name], NP      . OLANZapine (ZYPREXA) tablet 30 mg  30 mg Oral QHS Audryana Hockenberry  B, MD   30 mg at 01/16/17 2117  . traZODone (DESYREL) tablet 100 mg  100 mg Oral QHS PRN Paityn Balsam B, MD   100 mg at 01/12/17 2125    Lab Results: No results found for this or any previous visit (from the past 48 hour(s)).  Blood Alcohol level:  Lab Results  Component Value Date   ETH <10 12/27/2016   ETH  03/22/2007    <5        LOWEST DETECTABLE LIMIT FOR SERUM ALCOHOL IS 11 mg/dL FOR MEDICAL PURPOSES ONLY    Metabolic Disorder Labs: Lab Results  Component Value Date   HGBA1C 5.1 12/29/2016   MPG 99.67 12/29/2016   No results found for: PROLACTIN Lab Results  Component Value Date   CHOL 147 12/29/2016   TRIG 43 12/29/2016   HDL 54 12/29/2016   CHOLHDL 2.7 12/29/2016   VLDL 9 12/29/2016   LDLCALC 84 12/29/2016    Physical Findings: AIMS: Facial and Oral Movements Muscles of Facial Expression: None, normal Lips and Perioral Area: None, normal Jaw: None, normal Tongue: None, normal,Extremity Movements Upper (arms, wrists, hands, fingers): None, normal Lower (legs, knees, ankles, toes): None, normal, Trunk Movements Neck, shoulders, hips: None, normal, Overall Severity Severity of abnormal movements (highest score from questions above): None, normal Incapacitation due to abnormal movements: None, normal Patient's awareness of abnormal movements (rate only patient's report): No Awareness, Dental Status Current problems with teeth and/or dentures?: No Does patient usually wear dentures?: No  CIWA:    COWS:     Musculoskeletal: Strength & Muscle Tone: within normal limits Gait & Station: normal Patient leans: N/A  Psychiatric Specialty Exam: Physical Exam  Nursing note and vitals reviewed. Psychiatric: [name] has a normal mood and affect. [name] speech is normal and behavior is normal. Judgment and thought content normal. Cognition and memory are normal.    Review of Systems  Neurological: Negative.   Psychiatric/Behavioral: Positive for hallucinations.   All other systems reviewed and are negative.   Blood pressure 137/83, pulse 82, temperature 97.7 F (36.5 C), temperature source Oral, resp. rate 20, height 5\' 7"  (1.702 m), weight 72.6 kg (160 lb), SpO2 100 %.Body mass index is 25.06 kg/m.  General Appearance: Casual  Eye Contact:  Good  Speech:  Clear and Coherent  Volume:  Normal  Mood:  Euthymic  Affect:  Blunt  Thought Process:  Goal Directed and Descriptions of Associations: Intact  Orientation:  Full (Time, Place, and Person)  Thought Content:  Hallucinations: Auditory and Paranoid Ideation  Suicidal Thoughts:  No  Homicidal Thoughts:  No  Memory:  Immediate;  Fair Recent;   Fair Remote;   Fair  Judgement:  Poor  Insight:  Lacking  Psychomotor Activity:  Normal  Concentration:  Concentration: Fair and Attention Span: Fair  Recall:  [employer] of Knowledge:  Fair  Language:  Fair  Akathisia:  No  Handed:  Right  AIMS (if indicated):     Assets:  Communication Skills Desire for Improvement Housing Physical Health Resilience Social Support  ADL's:  Intact  Cognition:  WNL  Sleep:  Number of Hours: 7.45     Treatment Plan Summary: Daily contact with patient to assess and evaluate symptoms and progress in treatment and Medication management   [name] is a 47 year old female with a history of bipolar disorder admitted for psychotic break in the context of medication noncompliance.  #Mood and psychosis, minimal improvement -continue Zyprexa30mg  nightly -continue Haldol decanoate injections 100 mg monthly, next dose on 2/11 -continue Depakote 944 mg BID  #Metabolic syndrome monitoring -Lipid panel, TSH and HgbA1C arenormal -EKG, QTc 438 -pregnancy test is negative  #Social -family meeting completed -disability application recommended -ACT team recommended -pharmacy assistance necessary -letter to court  #Disposition -discharge with family -follow up with [facility]    Orson Slick,  MD 01/17/2017, 2:48 PM

## 2017-01-17 NOTE — Progress Notes (Signed)
Patient has been in the milieu, calm and cooperative. Alert and oriented. Denying thoughts of self harm. Patient is guarded upon approach, and brief during conversations. Patient attended group, had a snack and presented to the medication room to have her bedtime medications. Discussed about her upcoming discharge and stated she knows that she will follow up at [facility]. No concerns. Support and encouragements provided. Staff continue to monitor.

## 2017-01-17 NOTE — Progress Notes (Signed)
Recreation Therapy Notes   Date: 01.17.2019  Time: 9:30 am   Location: Craft Room  Behavioral response: Appropriate  Intervention Topic: Anger  Discussion/Intervention: Group content on today was focused on anger management. The group defined anger and reasons they become angry. Individuals expressed negative way they have dealt with anger in the past. Patients stated some positive ways they could deal with anger in the future. The group described how anger can affect your health and daily plans. Individuals participated in the intervention "Score your anger" where they had a chance to answer questions about themselves and get a score of their anger.  Clinical Observations/Feedback:  Patient came to group and stated that she normally handles anger by being quiet. She stated a positive way to deal with anger is to talk about it. Individual participated in the intervention and continues to make progress toward her goals. [name] LRT/CTRS          [name] 01/17/2017 10:37 AM

## 2017-01-17 NOTE — BHH Group Notes (Signed)
[facility] Group Notes:  (Nursing/MHT/Case Management/Adjunct)  Date:  01/17/2017  Time:  3:38 PM  Type of Therapy:  Psychoeducational Skills  Participation Level:  Minimal  Participation Quality:  Attentive  Affect:  Flat  Cognitive:  Oriented  Insight:  Good  Engagement in Group:  Limited  Modes of Intervention:  Discussion and Education  Summary of Progress/Problems:  Kathi Ludwig 01/17/2017, 3:38 PM

## 2017-01-17 NOTE — Progress Notes (Signed)
D- Patient alert and oriented. Patient presents in a pleasant mood on assessment stating that she slept ok last night with no complaints. Patient denies SI, HI, AVH, and pain at this time. Patient also denies any signs/symptoms of depression and anxiety.  A- Scheduled medications administered to patient, per MD orders. Support and encouragement provided.  Routine safety checks conducted every 15 minutes.  Patient informed to notify staff with problems or concerns.  R- No adverse drug reactions noted. Patient contracts for safety at this time. Patient compliant with medications and treatment plan. Patient receptive, calm, and cooperative. Patient interacts well with others on the unit.  Patient remains safe at this time.

## 2017-01-17 NOTE — BHH Group Notes (Signed)
01/17/2017  Time: 1:00PM  Type of Therapy/Topic:  Group Therapy:  Balance in Life  Participation Level:  Active  Description of Group:   This group will address the concept of balance and how it feels and looks when one is unbalanced. Patients will be encouraged to process areas in their lives that are out of balance and identify reasons for remaining unbalanced. Facilitators will guide patients in utilizing problem-solving interventions to address and correct the stressor making their life unbalanced. Understanding and applying boundaries will be explored and addressed for obtaining and maintaining a balanced life. Patients will be encouraged to explore ways to assertively make their unbalanced needs known to significant others in their lives, using other group members and facilitator for support and feedback.  Therapeutic Goals: 1. Patient will identify two or more emotions or situations they have that consume much of in their lives. 2. Patient will identify signs/triggers that life has become out of balance:  3. Patient will identify two ways to set boundaries in order to achieve balance in their lives:  4. Patient will demonstrate ability to communicate their needs through discussion and/or role plays  Summary of Patient Progress: Pt continues to work towards their tx goals but has not yet reached them. Pt was able to appropriately participate in group discussion, and was able to offer support/validation to other group members. Pt reported she is feeling, "pretty good because I am taking all of my medications and attending all groups so I am able to go home tomorrow." Pt reported two areas of her life that she feels consume too much of her time are, "family members and work." Pt reported one way she can tell her life is out of balance is when she is "tired all of the time." Pt reported one way she is going to maintain a better balance upon discharge is, "trying to understand and cope with my  psychiatric treatment."  Therapeutic Modalities:   Cognitive Behavioral Therapy Solution-Focused Therapy Assertiveness Training  Alden Hipp, MSW, LCSW 01/17/2017 1:44 PM

## 2017-01-17 NOTE — BHH Group Notes (Signed)
LCSW Group Therapy Note 01/17/2017 9:00 AM  Type of Therapy and Topic:  Group Therapy:  Setting Goals  Participation Level:  Active  Description of Group: In this process group, patients discussed using strengths to work toward goals and address challenges.  Patients identified two positive things about themselves and one goal they were working on.  Patients were given the opportunity to share openly and support each other's plan for self-empowerment.  The group discussed the value of gratitude and were encouraged to have a daily reflection of positive characteristics or circumstances.  Patients were encouraged to identify a plan to utilize their strengths to work on current challenges and goals.  Therapeutic Goals 1. Patient will verbalize personal strengths/positive qualities and relate how these can assist with achieving desired personal goals 2. Patients will verbalize affirmation of peers plans for personal change and goal setting 3. Patients will explore the value of gratitude and positive focus as related to successful achievement of goals 4. Patients will verbalize a plan for regular reinforcement of personal positive qualities and circumstances.  Summary of Patient Progress:  Vani actively participated in Setting Goals group and shared with CSW that she understands how she can establish her own SMART goals.  Kruti shared that her goal for today is "take my medications, go to groups, do what I need to be able to discharge tomorrow".     Therapeutic Modalities Cognitive Behavioral Therapy Motivational Interviewing    Devona Konig, [place] 01/17/2017 2:49 PM

## 2017-01-18 NOTE — Progress Notes (Signed)
Recreation Therapy Notes  INPATIENT RECREATION TR PLAN  Patient Details Name: [name] MRN: [id number] DOB: 1970/09/14 Today's Date: 01/18/2017  Rec Therapy Plan Is patient appropriate for Therapeutic Recreation?: Yes Treatment times per week: at least 3 Estimated Length of Stay: 5-7 days TR Treatment/Interventions: Group participation (Comment)  Discharge Criteria Pt will be discharged from therapy if:: Discharged Treatment plan/goals/alternatives discussed and agreed upon by:: Patient/family  Discharge Summary Short term goals set: Patient will engage in interactions with peers and staff in a pro-social manner x5 days.  Short term goals met: Complete Progress toward goals comments: Groups attended Which groups?: Self-esteem, Anger management, Stress management, Communication, Coping skills, Leisure education, Goal setting(Emotions, Team work, Values, Problem Solving, Creative expressions) Reason goals not met: N/A Therapeutic equipment acquired: N/A Reason patient discharged from therapy: Discharge from hospital Pt/family agrees with progress & goals achieved: Yes Date patient discharged from therapy: 01/18/17   [name] 01/18/2017, 11:37 AM

## 2017-01-18 NOTE — Progress Notes (Signed)
Received [name] this am after breakfast, she was compliant with her medications. She is preparing for discharge today and packing her personal belongings in her room. She received her discharge order, the AVS was reviewed and her questions answered . She received her 7 day supply of medications, prsecriptions and her letter to the court. The sheriff arrived to transport her home and they were informed about the letter that must go directly to her father. She was discharge with incident.

## 2017-01-18 NOTE — BHH Suicide Risk Assessment (Signed)
[facility] Discharge Suicide Risk Assessment   Principal Problem: Bipolar I disorder, current or most recent episode manic, with psychotic features [facility]) Discharge Diagnoses:  Patient Active Problem List   Diagnosis Date Noted  . Bipolar I disorder, current or most recent episode manic, with psychotic features ([place]) [F31.2] 12/28/2016    Priority: High  . Bipolar affective disorder, mixed, severe, with psychotic behavior ([place]) [F31.64] 12/28/2016  . 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   Psychiatric/Behavioral: Negative.   All other systems reviewed and are negative.   Blood pressure 129/85, pulse 62, temperature 97.7 F (36.5 C), temperature source Oral, resp. rate 18, height 5\' 7"  (1.702 m), weight 72.6 kg (160 lb), SpO2 100 %.Body mass index is 25.06 kg/m.  General Appearance: Disheveled  Eye Contact::  Good  Speech:  Clear and Coherent409  Volume:  Normal  Mood:  Euthymic  Affect:  Flat  Thought Process:  Goal Directed and Descriptions of Associations: Intact  Orientation:  Full (Time, Place, and Person)  Thought Content:  WDL  Suicidal Thoughts:  No  Homicidal Thoughts:  No  Memory:  Immediate;   Fair Recent;   Fair Remote;   Fair  Judgement:  Impaired  Insight:  Shallow  Psychomotor Activity:  Normal  Concentration:  Fair  Recall:  [employer] of Knowledge:Fair  Language: Fair  Akathisia:  No  Handed:  Right  AIMS (if indicated):     Assets:  Communication Skills Desire for Improvement Housing Physical Health Resilience Social Support  Sleep:  Number of Hours: 7.45  Cognition: WNL  ADL's:  Intact   Mental Status Per Nursing Assessment::   On Admission:  NA(Denies SI, HI and AVH)  Demographic Factors:  Low socioeconomic status  and Unemployed  Loss Factors: Decrease in vocational status, Loss of significant relationship, Legal issues and Financial problems/change in socioeconomic status  Historical Factors: Impulsivity  Risk Reduction Factors:   Responsible for children under 34 years of age, Sense of responsibility to family, Religious beliefs about death, Living with another person, especially a relative and Positive social support  Continued Clinical Symptoms:  Bipolar Disorder:   Mixed State  Cognitive Features That Contribute To Risk:  None    Suicide Risk:  Minimal: No identifiable suicidal ideation.  Patients presenting with no risk factors but with morbid ruminations; may be classified as minimal risk based on the severity of the depressive symptoms  Follow-up Information    [facility] Follow up on 01/08/2017.   Why:  Your follow-up appointment is on 01/08/17 at 8:00 AM Contact information: [address] [place] [address] [phone]           Plan Of Care/Follow-up recommendations:  Activity:  as tolerated Diet:  low sodium heart healthy Other:  keep follow up appointments  Orson Slick, MD 01/18/2017, 8:19 AM

## 2017-01-18 NOTE — BHH Group Notes (Addendum)
01/18/2017 1PM  Type of Therapy and Topic:  Group Therapy:  Feelings around Relapse and Recovery  Participation Level:  None   Description of Group:    Patients in this group will discuss emotions they experience before and after a relapse. They will process how experiencing these feelings, or avoidance of experiencing them, relates to having a relapse. Facilitator will guide patients to explore emotions they have related to recovery. Patients will be encouraged to process which emotions are more powerful. They will be guided to discuss the emotional reaction significant others in their lives may have to patients' relapse or recovery. Patients will be assisted in exploring ways to respond to the emotions of others without this contributing to a relapse.  Therapeutic Goals: 1. Patient will identify two or more emotions that lead to a relapse for them 2. Patient will identify two emotions that result when they relapse 3. Patient will identify two emotions related to recovery 4. Patient will demonstrate ability to communicate their needs through discussion and/or role plays   Summary of Patient Progress: Actively and appropriately engaged in the group. Patient practiced active listening when interacting with the facilitator and other group members.    Therapeutic Modalities:   Cognitive Behavioral Therapy Solution-Focused Therapy Assertiveness Training Relapse Prevention Therapy   Darin Engels, [place] 01/18/2017 1:59 PM

## 2017-01-18 NOTE — Discharge Summary (Signed)
Physician Discharge Summary Note  Patient:  [name] is an 47 y.o., female MRN:  [id number] DOB:  04-25-1970 Patient phone:  [phone] (home)  Patient address:   [address] [place] [address],  Total Time spent with patient: 30 minutes  Date of Admission:  12/28/2016 Date of Discharge: 01/18/2017  Reason for Admission:  Psychotic break  Identifying data. [name] is a 47 year old female with a history of bipolar disorder admitted for psychotic break.   Chief complaint. "They gave me medicines."  History of present illness. Information was obtained from the patient and the chart. The patient was petitioned by her family. Apparently, she has beebn increasingly psychotic, paranoid and disorganized to the point that her children, ages 51, 41, and 84, allerted family members that mother has not been doing well. The patient herself is not providing  Much information today. She is in bed with her eyes closed, refusing to come to the office, either falling asleep or actively hallucinating. She denies any symptoms of depression  "Don't think so" , anxiety or psychosis. She realizes that she was started on medications, has no complaints, but is unable to mane any current or past medications. She denies alcohol or illicit substance use.   Past psychiatric history. The patient has a long history of bipolar disorder with several psychotic episodes. She was treated with Haldol while pregnant with one of her children. She was hospitalized "several times". She only recognizes Risperdal. She was on Abilify maintena at some point. Denies attempting suicide.  Family psychiatric history. Denies any.  Social history. She lives with her three children in subsidized housing and on food stamps since she lost her job in May. She is not willing to explain how but reportedly due to conflict. Her husband was deported and is in [place] now. He provides no support and the patient has been  trying to obtain a divorce and alimony. She also wants the sole custody of the children. She initially denied having any family but her father travelled from [place] to lend support and was able to participate in a family meeting before discharge. Her sister-in-law could also be involved if the patient allows.   Principal Problem: Bipolar I disorder, current or most recent episode manic, with psychotic features [facility]) Discharge Diagnoses: Patient Active Problem List   Diagnosis Date Noted  . Bipolar I disorder, current or most recent episode manic, with psychotic features ([place]) [F31.2] 12/28/2016    Priority: High  . Bipolar affective disorder, mixed, severe, with psychotic behavior ([place]) [F31.64] 12/28/2016  . Ptosis of both eyelids [H02.403] 01/21/2013    Past Medical History:  Past Medical History:  Diagnosis Date  . Anxiety   . Depression   . Hypertension     Past Surgical History:  Procedure Laterality Date  . CESAREAN SECTION  2002, 2009   Family History:  Family History  Problem Relation Age of Onset  . Breast cancer Mother        Deceased, 80  . Hypertension Mother   . Arthritis Father   . Hypertension Father   . Healthy Son   . Asthma Daughter   . Diabetes type II Daughter    Social History:  Social History   Substance and Sexual Activity  Alcohol Use No     Social History   Substance and Sexual Activity  Drug Use No    Social History   Socioeconomic History  . Marital status: Married    Spouse name: None  .  Number of children: None  . Years of education: None  . Highest education level: None  Social Needs  . Financial resource strain: None  . Food insecurity - worry: None  . Food insecurity - inability: None  . Transportation needs - medical: None  . Transportation needs - non-medical: None  Occupational History  . None  Tobacco Use  . Smoking status: Never Smoker  . Smokeless tobacco: Never Used  Substance and Sexual Activity  . Alcohol use:  No  . Drug use: No  . Sexual activity: None  Other Topics Concern  . None  Social History Narrative   She lives at home with 2 sons (50, 77) and 1 daughter (45).   She is currently not working.  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  She moved from [place] in 2013.    Hospital Course:    [name] is a 47 year old female with a history of severe bipolar illness admitted for psychotic break in the context of treatment noncompliance. Psychotic symptoms have resolved. Patient accepts treatment and agrees to follow up with mental health providers in the community.  #Psychosis, resolved -continue Zyprexa30mg  nightly -continue Haldol decanoate injections 100 mg monthly, next dose on 2/11 -continue Depakote 841 mg BID  #Metabolic syndrome monitoring -Lipid panel, TSH and HgbA1C arenormal -EKG, QTc 438 -pregnancy test is negative  #Social -family meeting completed -we recommend that patient applies for disability -information on the [employer] provided -patient does not meet criteria for ACT team yet -pharmacy assistance necessary -letter to court provided  #Disposition -discharge with family -follow up with [facility]    Physical Findings: AIMS: Facial and Oral Movements Muscles of Facial Expression: None, normal Lips and Perioral Area: None, normal Jaw: None, normal Tongue: None, normal,Extremity Movements Upper (arms, wrists, hands, fingers): None, normal Lower (legs, knees, ankles, toes): None, normal, Trunk Movements Neck, shoulders, hips: None, normal, Overall Severity Severity of abnormal movements (highest score from questions above): None, normal Incapacitation due to abnormal movements: None, normal Patient's awareness of abnormal movements (rate only patient's report): No Awareness, Dental Status Current problems with teeth and/or dentures?: No Does patient usually wear dentures?: No  CIWA:    COWS:      Musculoskeletal: Strength & Muscle Tone: within normal limits Gait & Station: normal Patient leans: N/A  Psychiatric Specialty Exam: Physical Exam  Nursing note and vitals reviewed. Psychiatric: She has a normal mood and affect. Her speech is normal and behavior is normal. Judgment and thought content normal. Cognition and memory are normal.    Review of Systems  Neurological: Negative.   Psychiatric/Behavioral: Negative.   All other systems reviewed and are negative.   Blood pressure 129/85, pulse 62, temperature 97.7 F (36.5 C), temperature source Oral, resp. rate 18, height 5\' 7"  (1.702 m), weight 72.6 kg (160 lb), SpO2 100 %.Body mass index is 25.06 kg/m.  General Appearance: Casual  Eye Contact:  Good  Speech:  Clear and Coherent  Volume:  Normal  Mood:  Euthymic  Affect:  Flat  Thought Process:  Goal Directed and Descriptions of Associations: Intact  Orientation:  Full (Time, Place, and Person)  Thought Content:  WDL  Suicidal Thoughts:  No  Homicidal Thoughts:  No  Memory:  Immediate;   Fair Recent;   Fair Remote;   Fair  Judgement:  Impaired  Insight:  Shallow  Psychomotor Activity:  Normal  Concentration:  Concentration: Fair and Attention Span: Fair  Recall:  Smiley Houseman of Knowledge:  Fair  Language:  Fair  Akathisia:  No  Handed:  Right  AIMS (if indicated):     Assets:  Communication Skills Desire for Improvement Housing Physical Health Resilience Social Support  ADL's:  Intact  Cognition:  WNL  Sleep:  Number of Hours: 7.45     Have you used any form of tobacco in the last 30 days? (Cigarettes, Smokeless Tobacco, Cigars, and/or Pipes): No  Has this patient used any form of tobacco in the last 30 days? (Cigarettes, Smokeless Tobacco, Cigars, and/or Pipes) Yes, No  Blood Alcohol level:  Lab Results  Component Value Date   ETH <10 12/27/2016   ETH  03/22/2007    <5        LOWEST DETECTABLE LIMIT FOR SERUM ALCOHOL IS 11 mg/dL FOR MEDICAL  PURPOSES ONLY    Metabolic Disorder Labs:  Lab Results  Component Value Date   HGBA1C 5.1 12/29/2016   MPG 99.67 12/29/2016   No results found for: PROLACTIN Lab Results  Component Value Date   CHOL 147 12/29/2016   TRIG 43 12/29/2016   HDL 54 12/29/2016   CHOLHDL 2.7 12/29/2016   VLDL 9 12/29/2016   [place] 84 12/29/2016    See Psychiatric Specialty Exam and Suicide Risk Assessment completed by Attending Physician prior to discharge.  Discharge destination:  Home  Is patient on multiple antipsychotic therapies at discharge:  Yes,   Do you recommend tapering to monotherapy for antipsychotics?  No   Has Patient had three or more failed trials of antipsychotic monotherapy by history:  Yes,   Antipsychotic medications that previously failed include:   1.  haldol., 2.  zyprexa. and 3.  abilify.  Recommended Plan for Multiple Antipsychotic Therapies: Additional reason(s) for multiple antispychotic treatment:  inadequate response to a single agent  Discharge Instructions    Diet - low sodium heart healthy   Complete by:  As directed    Increase activity slowly   Complete by:  As directed      Allergies as of 01/18/2017      Reactions   Latex       Medication List    TAKE these medications     Indication  divalproex 250 MG DR tablet Commonly known as:  DEPAKOTE Take 1 tablet (250 mg total) by mouth every 12 (twelve) hours.  Indication:  Manic Phase of Manic-Depression   docusate sodium 100 MG capsule Commonly known as:  COLACE Take 1 capsule (100 mg total) by mouth daily with breakfast.  Indication:  Constipation   ferrous sulfate 325 (65 FE) MG tablet Take 1 tablet (325 mg total) by mouth daily with breakfast.  Indication:  Iron Deficiency   haloperidol decanoate 100 MG/ML injection Commonly known as:  HALDOL DECANOATE Inject 1 mL (100 mg total) into the muscle every 28 (twenty-eight) days. Start taking on:  02/11/2017  Indication:  Psychosis   OLANZapine 15  MG tablet Commonly known as:  ZYPREXA Take 2 tablets (30 mg total) by mouth at bedtime.  Indication:  Schizophrenia   traZODone 100 MG tablet Commonly known as:  DESYREL Take 1 tablet (100 mg total) by mouth at bedtime as needed for sleep.  Indication:  Trouble Sleeping      Follow-up Information    [facility] Follow up on 01/08/2017.   Why:  Your follow-up appointment is on 01/08/17 at 8:00 AM Contact information: [address] [place] [address] [phone]  Follow-up recommendations:  Activity:  as tolerated Diet:  low sodium heart healthy Other:  keep follow up appointments  Comments:     Signed: Orson Slick, MD 01/18/2017, 8:23 AM

## 2017-01-18 NOTE — Progress Notes (Signed)
  [facility] Adult Case Management Discharge Plan :  Will you be returning to the same living situation after discharge:  Yes,  Pt will be returning to her home At discharge, do you have transportation home?: Yes,  Pt will be transported to her home by local Sheriff's Department Do you have the ability to pay for your medications: No.  Release of information consent forms completed and in the chart;  Patient's signature needed at discharge.  Patient to Follow up at: Follow-up Information    [facility] Follow up on 01/08/2017.   Why:  Your follow-up appointment is on 01/08/17 at 8:00 AM Contact information: [place] [address] [phone]           Next level of care provider has access to [place] and Suicide Prevention discussed: Yes,  No safety issues identified  Have you used any form of tobacco in the last 30 days? (Cigarettes, Smokeless Tobacco, Cigars, and/or Pipes): No  Has patient been referred to the Quitline?: N/A patient is not a smoker  Patient has been referred for addiction treatment: N/A  Devona Konig, LCSW 01/18/2017, 9:28 AM

## 2017-01-18 NOTE — Progress Notes (Signed)
Recreation Therapy Notes  Date: 01.18.2019  Time: 9:30 am  Location: Craft Room  Behavioral response: Appropriate  Intervention Topic: Emotions  Discussion/Intervention: Group content on today was focused on emotions. The group identified what emotions are and why it is important to have emotions. Patients expressed some positive and negative emotions. Individuals gave some past experiences on how they normally dealt with emotions in the past. The group described some positive ways to deal with emotions in the future. Patients participated in the intervention "what a day" where individuals were given a chance to respond to certain situations involving their emotions.  Clinical Observations/Feedback:  Patient came to group and defined emotions as a feeling. She stated that she normally deals with her emotions by bottling things up. Individual participated in the intervention during group and remained on topic.  Arash Karstens LRT/CTRS         Finch Costanzo 01/18/2017 11:24 AM

## 2017-02-01 ENCOUNTER — Other Ambulatory Visit: Payer: Self-pay | Admitting: Nurse Practitioner

## 2017-02-01 DIAGNOSIS — Z1231 Encounter for screening mammogram for malignant neoplasm of breast: Secondary | ICD-10-CM

## 2017-02-02 ENCOUNTER — Encounter: Payer: Self-pay | Admitting: Family Medicine

## 2017-02-06 ENCOUNTER — Encounter: Payer: Self-pay | Admitting: Nurse Practitioner

## 2017-02-06 ENCOUNTER — Encounter: Payer: Self-pay | Admitting: *Deleted

## 2017-02-06 DIAGNOSIS — D259 Leiomyoma of uterus, unspecified: Secondary | ICD-10-CM | POA: Insufficient documentation

## 2017-02-11 ENCOUNTER — Encounter: Payer: Self-pay | Admitting: Nurse Practitioner

## 2017-10-15 ENCOUNTER — Encounter: Payer: Self-pay | Admitting: *Deleted

## 2018-05-16 ENCOUNTER — Inpatient Hospital Stay (HOSPITAL_COMMUNITY)
Admission: RE | Admit: 2018-05-16 | Discharge: 2018-05-26 | DRG: 885 | Disposition: A | Discharge status: Home / Self Care | Payer: Medicaid Other | Attending: Psychiatry | Admitting: Psychiatry

## 2018-05-16 ENCOUNTER — Other Ambulatory Visit: Payer: Self-pay

## 2018-05-16 ENCOUNTER — Encounter (HOSPITAL_COMMUNITY): Payer: Self-pay

## 2018-05-16 DIAGNOSIS — F3164 Bipolar disorder, current episode mixed, severe, with psychotic features: Principal | ICD-10-CM | POA: Diagnosis present

## 2018-05-16 DIAGNOSIS — G4700 Insomnia, unspecified: Secondary | ICD-10-CM | POA: Diagnosis present

## 2018-05-16 DIAGNOSIS — R45851 Suicidal ideations: Secondary | ICD-10-CM | POA: Diagnosis present

## 2018-05-16 DIAGNOSIS — F3110 Bipolar disorder, current episode manic without psychotic features, unspecified: Secondary | ICD-10-CM | POA: Diagnosis present

## 2018-05-16 DIAGNOSIS — F259 Schizoaffective disorder, unspecified: Secondary | ICD-10-CM | POA: Diagnosis present

## 2018-05-16 DIAGNOSIS — F312 Bipolar disorder, current episode manic severe with psychotic features: Secondary | ICD-10-CM | POA: Diagnosis present

## 2018-05-16 MED ORDER — HALOPERIDOL LACTATE 5 MG/ML IJ SOLN: 5.0000 mg | Freq: Four times a day (QID) | INTRAMUSCULAR | Status: DC | PRN

## 2018-05-16 MED ORDER — LORAZEPAM 2 MG/ML IJ SOLN: 2.0000 mg | Freq: Four times a day (QID) | INTRAMUSCULAR | Status: DC | PRN

## 2018-05-16 MED ORDER — LORAZEPAM 1 MG PO TABS: 2.0000 mg | ORAL_TABLET | Freq: Four times a day (QID) | ORAL | Status: DC | PRN

## 2018-05-16 MED ORDER — HALOPERIDOL 5 MG PO TABS: 5.0000 mg | ORAL_TABLET | Freq: Four times a day (QID) | ORAL | Status: DC | PRN

## 2018-05-16 MED ORDER — HYDROXYZINE HCL 25 MG PO TABS
25.0000 mg | ORAL_TABLET | Freq: Three times a day (TID) | ORAL | Status: DC | PRN
  Administered 2018-05-24: 25 mg via ORAL
  Filled 2018-05-16: qty 1

## 2018-05-16 MED ORDER — ALUM & MAG HYDROXIDE-SIMETH 200-200-20 MG/5ML PO SUSP: 30.0000 mL | ORAL | Status: DC | PRN

## 2018-05-16 MED ORDER — TRAZODONE HCL 50 MG PO TABS: 50.0000 mg | ORAL_TABLET | Freq: Every evening | ORAL | Status: DC | PRN

## 2018-05-16 MED ORDER — DIPHENHYDRAMINE HCL 25 MG PO CAPS: 50.0000 mg | ORAL_CAPSULE | Freq: Four times a day (QID) | ORAL | Status: DC | PRN

## 2018-05-16 MED ORDER — ACETAMINOPHEN 325 MG PO TABS: 650.0000 mg | ORAL_TABLET | Freq: Four times a day (QID) | ORAL | Status: DC | PRN

## 2018-05-16 MED ORDER — MAGNESIUM HYDROXIDE 400 MG/5ML PO SUSP: 30.0000 mL | Freq: Every day | ORAL | Status: DC | PRN

## 2018-05-16 MED ORDER — DIPHENHYDRAMINE HCL 50 MG/ML IJ SOLN: 50.0000 mg | Freq: Four times a day (QID) | INTRAMUSCULAR | Status: DC | PRN

## 2018-05-16 NOTE — Progress Notes (Signed)
[place] NOVEL CORONAVIRUS (COVID-19) DAILY CHECK-OFF SYMPTOMS - answer yes or no to each - every day NO YES  Have you had a fever in the past 24 hours?  . Fever (Temp > 37.80C / 100F) X   Have you had any of these symptoms in the past 24 hours? . New Cough .  Sore Throat  .  Shortness of Breath .  Difficulty Breathing .  Unexplained Body Aches   X   Have you had any one of these symptoms in the past 24 hours not related to allergies?   . Runny Nose .  Nasal Congestion .  Sneezing   X   If you have had runny nose, nasal congestion, sneezing in the past 24 hours, has it worsened?  X   EXPOSURES - check yes or no X   Have you traveled outside the state in the past 14 days?  X   Have you been in contact with someone with a confirmed diagnosis of COVID-19 or PUI in the past 14 days without wearing appropriate PPE?  X   Have you been living in the same home as a person with confirmed diagnosis of COVID-19 or a PUI (household contact)?    X   Have you been diagnosed with COVID-19?    X              What to do next: Answered NO to all: Answered YES to anything:   Proceed with unit schedule Follow the [facility] Inpatient Flowsheet.   

## 2018-05-16 NOTE — H&P (Signed)
[facility] Provider Admission PAA/H&P  Patient Identification: [name] MRN:  [id number] Date of Evaluation:  05/16/2018 Chief Complaint:  schizoaffective Principal Diagnosis: <principal problem not specified> Diagnosis:  Active Problems:   Schizoaffective disorder ([place])  History of Present Illness: 48 y.o. female who presents invol[facility]arily to [facility] via [employer]. Pt was petitioned by her brother, Aziyah Provencal [phone] for suicidal statements & auditory hallucinations. Pt has a history of Bipolar dx and was admitted to [facility] 12/2016 with aggressive behavior. Pt denies all symptoms of SI, Depression & psychosis. She states she does not know the name of what is harassing her. She states she has been bothered by something for years. Pt states it has ruined everything in her house- even her clothes and her hair. Pt denies past SI, HI & mental health tx. Pt denies sx of depression except increased tearfulness. Pt states current stressors include financial after resigning from her job. She reports she sleeps 5-6 hours a night and that she has recent weight loss.   Pt lives with her 3 children, ages 46, 34 & 61. Pt states her 46 year old is supportive and helpful to her. Pt has limited insight and judgment. Pt's memory is intact. Per brother, legal history includes charges against pt for spitting on a stranger.  Associated Signs/Symptoms: Depression Symptoms:  Patient denies any symptoms (Hypo) Manic Symptoms:  Patient denies any symptoms Anxiety Symptoms:  patient denies any symptoms Psychotic Symptoms:  Patient denies any symptoms PTSD Symptoms: Denies Total Time spent with patient: 30 minutes  Past Psychiatric History: She was hospitalized "several times". She only recognizes Risperdal and reportedly was on injections at some point. Denies attempting suicide.  Is the patient at risk to self? Per IVC- yes Has the patient been a risk to self in the past 6 months? Yes.     Has the patient been a risk to self within the distant past? Yes.    Is the patient a risk to others? No.  Has the patient been a risk to others in the past 6 months? No.  Has the patient been a risk to others within the distant past? No.   Prior Inpatient Therapy:   Prior Outpatient Therapy:    Alcohol Screening:   Substance Abuse History in the last 12 months:  No. Consequences of Substance Abuse: NA Previous Psychotropic Medications: Yes  Psychological Evaluations: Yes  Past Medical History:  Past Medical History:  Diagnosis Date  . Anxiety   . Depression   . Hypertension     Past Surgical History:  Procedure Laterality Date  . CESAREAN SECTION  2002, 2009   Family History:  Family History  Problem Relation Age of Onset  . Breast cancer Mother        Deceased, 49  . Hypertension Mother   . Arthritis Father   . Hypertension Father   . Healthy Son   . Asthma Daughter   . Diabetes type II Daughter    Family Psychiatric History: Denies Tobacco Screening:   Social History:  Social History   Substance and Sexual Activity  Alcohol Use No     Social History   Substance and Sexual Activity  Drug Use No    Additional Social History: Marital status: (P) Single    Pain Medications: None reported Prescriptions: None reported Over the Counter: None reported History of alcohol / drug use?: No history of alcohol / drug abuse  Allergies:   Allergies  Allergen Reactions  . Latex    Lab Results: No results found for this or any previous visit (from the past 48 hour(s)).  Blood Alcohol level:  Lab Results  Component Value Date   ETH <10 12/27/2016   ETH  03/22/2007    <5        LOWEST DETECTABLE LIMIT FOR SERUM ALCOHOL IS 11 mg/dL FOR MEDICAL PURPOSES ONLY    Metabolic Disorder Labs:  Lab Results  Component Value Date   HGBA1C 5.1 12/29/2016   MPG 99.67 12/29/2016   No results found for: PROLACTIN Lab Results  Component  Value Date   CHOL 147 12/29/2016   TRIG 43 12/29/2016   HDL 54 12/29/2016   CHOLHDL 2.7 12/29/2016   VLDL 9 12/29/2016   LDLCALC 84 12/29/2016    Current Medications: Current Facility-Administered Medications  Medication Dose Route Frequency Provider Last Rate Last Dose  . acetaminophen (TYLENOL) tablet 650 mg  650 mg Oral Q6H PRN Kaede Clendenen, Lowry Ram, FNP      . alum & mag hydroxide-simeth (MAALOX/MYLANTA) 200-200-20 MG/5ML suspension 30 mL  30 mL Oral Q4H PRN Jehad Bisono, Darnelle Maffucci B, FNP      . diphenhydrAMINE (BENADRYL) capsule 50 mg  50 mg Oral Q6H PRN Sidney Silberman, Lowry Ram, FNP       Or  . diphenhydrAMINE (BENADRYL) injection 50 mg  50 mg Intramuscular Q6H PRN Jaymi Tinner, Darnelle Maffucci B, FNP      . haloperidol (HALDOL) tablet 5 mg  5 mg Oral Q6H PRN Eldoris Beiser, Darnelle Maffucci B, FNP       Or  . haloperidol lactate (HALDOL) injection 5 mg  5 mg Intramuscular Q6H PRN Halle Davlin, Lowry Ram, FNP      . hydrOXYzine (ATARAX/VISTARIL) tablet 25 mg  25 mg Oral TID PRN Brynnley Dayrit, Lowry Ram, FNP      . LORazepam (ATIVAN) tablet 2 mg  2 mg Oral Q6H PRN Jeanna Giuffre, Lowry Ram, FNP       Or  . LORazepam (ATIVAN) injection 2 mg  2 mg Intramuscular Q6H PRN Anvith Mauriello, Darnelle Maffucci B, FNP      . magnesium hydroxide (MILK OF MAGNESIA) suspension 30 mL  30 mL Oral Daily PRN Vanita Cannell, Lowry Ram, FNP      . traZODone (DESYREL) tablet 50 mg  50 mg Oral QHS PRN Edwin Cherian, Lowry Ram, FNP       PTA Medications: Medications Prior to Admission  Medication Sig Dispense Refill Last Dose  . divalproex (DEPAKOTE) 250 MG DR tablet Take 1 tablet (250 mg total) by mouth every 12 (twelve) hours. 60 tablet 1   . docusate sodium (COLACE) 100 MG capsule Take 1 capsule (100 mg total) by mouth daily with breakfast. 30 capsule 1   . ferrous sulfate 325 (65 FE) MG tablet Take 1 tablet (325 mg total) by mouth daily with breakfast. 30 tablet 3   . haloperidol decanoate (HALDOL DECANOATE) 100 MG/ML injection Inject 1 mL (100 mg total) into the muscle every 28 (twenty-eight) days. 1 mL 1   .  OLANZapine (ZYPREXA) 15 MG tablet Take 2 tablets (30 mg total) by mouth at bedtime. 60 tablet 1   . traZODone (DESYREL) 100 MG tablet Take 1 tablet (100 mg total) by mouth at bedtime as needed for sleep. 30 tablet 1     Musculoskeletal: Strength & Muscle Tone: within normal limits Gait & Station: normal Patient leans: N/A  Psychiatric Specialty Exam: Physical Exam  Nursing note and vitals reviewed. Constitutional: She is  oriented to person, place, and time. She appears well-developed and well-nourished.  Respiratory: Effort normal.  Musculoskeletal: Normal range of motion.  Neurological: She is alert and oriented to person, place, and time.  Skin: Skin is warm.    Review of Systems  Constitutional: Negative.   HENT: Negative.   Eyes: Negative.   Respiratory: Negative.   Cardiovascular: Negative.   Gastrointestinal: Negative.   Genitourinary: Negative.   Musculoskeletal: Negative.   Skin: Negative.   Neurological: Negative.   Endo/Heme/Allergies: Negative.   Psychiatric/Behavioral: The patient is nervous/anxious.     Blood pressure (!) 139/112, pulse (!) 104, temperature 98.9 F (37.2 C), temperature source Oral, SpO2 100 %.There is no height or weight on file to calculate BMI.  General Appearance: Disheveled  Eye Contact:  Minimal  Speech:  Clear and Coherent  Volume:  Normal  Mood:  Anxious  Affect:  Flat  Thought Process:  Linear and Descriptions of Associations: Intact  Orientation:  Full (Time, Place, and Person)  Thought Content:  WDL  Suicidal Thoughts:  No  Homicidal Thoughts:  No  Memory:  Immediate;   Good Recent;   Good Remote;   Good  Judgement:  Fair  Insight:  Fair  Psychomotor Activity:  Normal  Concentration:  Concentration: Good and Attention Span: Good  Recall:  Good  Fund of Knowledge:  Good  Language:  Good  Akathisia:  No  Handed:  Right  AIMS (if indicated):     Assets:  Housing Physical Health Social Support Transportation  ADL's:   Intact  Cognition:  WNL  Sleep:         Treatment Plan Summary: Monitor in obs for behavior and psychosis  Agitation protocol medications ordered  Observation Level/Precautions:  15 minute checks Laboratory:  None at this time Psychotherapy:   Medications:   Consultations:   Discharge Concerns:   Estimated LOS: Other:      Lewis Shock, FNP 5/15/20207:32 PM

## 2018-05-16 NOTE — H&P (Signed)
Behavioral Health Medical Screening Exam  [name] is an 48 y.o. female.  Total Time spent with patient: 20 minutes  Psychiatric Specialty Exam: Physical Exam  Nursing note and vitals reviewed. Constitutional: She is oriented to person, place, and time. She appears well-developed and well-nourished.  Cardiovascular: Normal rate.  Respiratory: Effort normal.  Musculoskeletal: Normal range of motion.  Neurological: She is alert and oriented to person, place, and time.  Skin: Skin is warm.    Review of Systems  Constitutional: Negative.   HENT: Negative.   Eyes: Negative.   Respiratory: Negative.   Cardiovascular: Negative.   Gastrointestinal: Negative.   Genitourinary: Negative.   Musculoskeletal: Negative.   Skin: Negative.   Neurological: Negative.   Endo/Heme/Allergies: Negative.   Psychiatric/Behavioral: The patient is nervous/anxious.     Blood pressure (!) 139/112, pulse (!) 104, temperature 98.9 F (37.2 C), temperature source Oral, SpO2 100 %.There is no height or weight on file to calculate BMI.  General Appearance: Disheveled  Eye Contact:  Minimal  Speech:  Clear and Coherent and Normal Rate  Volume:  Normal  Mood:  Anxious  Affect:  Flat  Thought Process:  Linear and Descriptions of Associations: Intact  Orientation:  Full (Time, Place, and Person)  Thought Content:  WDL  Suicidal Thoughts:  No  Homicidal Thoughts:  No  Memory:  Immediate;   Good Recent;   Good Remote;   Good  Judgement:  Fair  Insight:  Fair  Psychomotor Activity:  Normal  Concentration: Concentration: Fair and Attention Span: Fair  Recall:  [employer] of Knowledge:Good  Language: Good  Akathisia:  No  Handed:  Right  AIMS (if indicated):     Assets:  Financial Resources/Insurance Housing Social Support Transportation  Sleep:       Musculoskeletal: Strength & Muscle Tone: within normal limits Gait & Station: normal Patient leans: N/A  Blood pressure (!)  139/112, pulse (!) 104, temperature 98.9 F (37.2 C), temperature source Oral, SpO2 100 %.  Recommendations:  Based on my evaluation the patient does not appear to have an emergency medical condition.  [place], FNP 05/16/2018, 7:23 PM
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
## 2018-05-16 NOTE — Plan of Care (Signed)
[facility] Observation Crisis Plan  Reason for Crisis Plan:  Chronic Mental Illness/Medical Illness   Plan of Care:  Referral for Inpatient Hospitalization  Family Support:    Current Living Environment:  Living Arrangements: Children(3 KIDS )  Insurance:   Hospital Account    Name Acct ID Class Status Primary Coverage   [name], [name] [id number] [place] - MEDICAID [place]-FAMILY PLANNING        Guarantor Account (for Hospital Account [id number])    Name Relation to [place]? Acct Type   [name] Self [facility] Yes Behavioral Health   Address Phone       [address], [place] [address] [phone])          Coverage Information (for Hospital Account [id number])    F/O Payor/Plan Precert #   MEDICAID [place]/MEDICAID [place]-FAMILY PLANNING    Subscriber Subscriber #   [name], [name] [id number] P   Address Phone   [address], [place] [address] [phone]      Legal Guardian:     Primary Care Provider:  Yevette Edwards, [name]  Current Outpatient Providers:   Psychiatrist:  Name of Psychiatrist: none per pt  Counselor/Therapist:  Name of Therapist: none per pt  Compliant with Medications: Pt states she doesn't take any medications.  Additional Information:   [name] 5/15/20208:02 PM

## 2018-05-17 DIAGNOSIS — F250 Schizoaffective disorder, bipolar type: Secondary | ICD-10-CM | POA: Diagnosis not present

## 2018-05-17 MED ORDER — ARIPIPRAZOLE 5 MG PO TABS: 5.0000 mg | ORAL_TABLET | Freq: Every day | ORAL | Status: DC

## 2018-05-17 MED ORDER — PALIPERIDONE ER 6 MG PO TB24
6.0000 mg | ORAL_TABLET | Freq: Every day | ORAL | Status: DC
  Administered 2018-05-17 – 2018-05-19 (×3): 6 mg via ORAL
  Filled 2018-05-17 (×6): qty 1

## 2018-05-17 MED ORDER — TRAZODONE HCL 100 MG PO TABS
100.0000 mg | ORAL_TABLET | Freq: Every evening | ORAL | Status: DC | PRN
  Filled 2018-05-17: qty 1

## 2018-05-17 MED ORDER — DIVALPROEX SODIUM 250 MG PO DR TAB
250.0000 mg | DELAYED_RELEASE_TABLET | Freq: Two times a day (BID) | ORAL | Status: DC
  Administered 2018-05-17 – 2018-05-18 (×3): 250 mg via ORAL
  Filled 2018-05-17 (×3): qty 1

## 2018-05-17 NOTE — Progress Notes (Addendum)
Patient meets criteria for inpatient treatment. No appropriate or available beds at [facility]. CSW faxed referrals to the following facilities for review:  [facility]  [facility]  [facility]  [facility]  [facility]  [facility]  [facility]  [facility]   TTS will continue to seek bed placement.  Chalmers Guest. Guerry Bruin, MSW, [place] Work/Disposition Phone: [phone] Fax: [phone]

## 2018-05-17 NOTE — Progress Notes (Signed)
Patient ID: [name], female   DOB: Jan 17, 1970, 48 y.o.   MRN: [id number] D: Patient is labile and agitated. She continues to refuse to give a urine specimen. She is disoriented to time and situation. She denies SI/HI/AVH. A: Reinforced importance of obtaining urine specimen. No medications ordered for this morning. R: Patient resting in bed.

## 2018-05-17 NOTE — Progress Notes (Signed)
Pt was irritable on approach. Pt denies SI/AVH/Pain at this time. Pt appears to be responding to internal stimuli follow by religiosity and paranoia. Pt was given fluids for her meds in which she dump out in the sink. Pt proceeded to drink water from bathroom faucet. Pt was given both snacks and juice. Pt will only eat packaged foods at this time. Will continue to monitor in OBS.

## 2018-05-17 NOTE — Progress Notes (Signed)
[place] NOVEL CORONAVIRUS (COVID-19) DAILY CHECK-OFF SYMPTOMS - answer yes or no to each - every day NO YES  Have you had a fever in the past 24 hours?  . Fever (Temp > 37.80C / 100F) X   Have you had any of these symptoms in the past 24 hours? . New Cough .  Sore Throat  .  Shortness of Breath .  Difficulty Breathing .  Unexplained Body Aches   X   Have you had any one of these symptoms in the past 24 hours not related to allergies?   . Runny Nose .  Nasal Congestion .  Sneezing   X   If you have had runny nose, nasal congestion, sneezing in the past 24 hours, has it worsened?  X   EXPOSURES - check yes or no X   Have you traveled outside the state in the past 14 days?  X   Have you been in contact with someone with a confirmed diagnosis of COVID-19 or PUI in the past 14 days without wearing appropriate PPE?  X   Have you been living in the same home as a person with confirmed diagnosis of COVID-19 or a PUI (household contact)?    X   Have you been diagnosed with COVID-19?    X              What to do next: Answered NO to all: Answered YES to anything:   Proceed with unit schedule Follow the [facility] Inpatient Flowsheet.   
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
## 2018-05-17 NOTE — Progress Notes (Signed)
UA cup given. Pt verbalized understanding. Pending sample.

## 2018-05-17 NOTE — Progress Notes (Signed)
   05/17/18 1045  COVID-19 Daily Checkoff  Have you had a fever (temp > 37.80C/100F)  in the past 24 hours?  No  If you have had runny nose, nasal congestion, sneezing in the past 24 hours, has it worsened? No  COVID-19 EXPOSURE  Have you traveled outside the state in the past 14 days? No  Have you been in contact with someone with a confirmed diagnosis of COVID-19 or PUI in the past 14 days without wearing appropriate PPE? No  Have you been living in the same home as a person with confirmed diagnosis of COVID-19 or a PUI (household contact)? No  Have you been diagnosed with COVID-19? No

## 2018-05-18 ENCOUNTER — Encounter (HOSPITAL_COMMUNITY): Payer: Self-pay | Admitting: Behavioral Health

## 2018-05-18 DIAGNOSIS — G4700 Insomnia, unspecified: Secondary | ICD-10-CM | POA: Diagnosis present

## 2018-05-18 DIAGNOSIS — F250 Schizoaffective disorder, bipolar type: Secondary | ICD-10-CM | POA: Diagnosis not present

## 2018-05-18 DIAGNOSIS — F3164 Bipolar disorder, current episode mixed, severe, with psychotic features: Secondary | ICD-10-CM | POA: Diagnosis present

## 2018-05-18 DIAGNOSIS — F3110 Bipolar disorder, current episode manic without psychotic features, unspecified: Secondary | ICD-10-CM | POA: Diagnosis present

## 2018-05-18 DIAGNOSIS — R45851 Suicidal ideations: Secondary | ICD-10-CM | POA: Diagnosis present

## 2018-05-18 DIAGNOSIS — F312 Bipolar disorder, current episode manic severe with psychotic features: Secondary | ICD-10-CM | POA: Diagnosis present

## 2018-05-18 LAB — RAPID URINE DRUG SCREEN, HOSP PERFORMED
Amphetamines: NOT DETECTED
Barbiturates: NOT DETECTED
Benzodiazepines: NOT DETECTED
Cocaine: NOT DETECTED
Opiates: NOT DETECTED
Tetrahydrocannabinol: NOT DETECTED

## 2018-05-18 MED ORDER — DIVALPROEX SODIUM 500 MG PO DR TAB
500.0000 mg | DELAYED_RELEASE_TABLET | Freq: Two times a day (BID) | ORAL | Status: DC
  Administered 2018-05-18 – 2018-05-21 (×6): 500 mg via ORAL
  Filled 2018-05-18 (×8): qty 1

## 2018-05-18 NOTE — Discharge Instructions (Signed)
Bipolar 1 Disorder Bipolar 1 disorder is a mental health disorder in which a person has episodes of emotional highs (mania), and may also have episodes of emotional lows (depression) in addition to highs. Bipolar 1 disorder is different from other bipolar disorders because it involves extreme manic episodes. These episodes last at least one week or involve symptoms that are so severe that hospitalization is needed to keep the person safe. What increases the risk? The cause of this condition is not known. However, certain factors make you more likely to have bipolar disorder, such as:  Having a family member with the disorder.  An imbalance of certain chemicals in the brain (neurotransmitters).  Stress, such as illness, financial problems, or a death.  Certain conditions that affect the brain or spinal cord (neurologic conditions).  Brain injury (trauma).  Having another mental health disorder, such as: ? Obsessive compulsive disorder. ? Schizophrenia. What are the signs or symptoms? Symptoms of mania include:  Very high self-esteem or self-confidence.  Decreased need for sleep.  Unusual talkativeness or feeling a need to keep talking. Speech may be very fast. It may seem like you cannot stop talking.  Racing thoughts or constant talking, with quick shifts between topics that may or may not be related (flight of ideas).  Decreased ability to focus or concentrate.  Increased purposeful activity, such as work, studies, or social activity.  Increased nonproductive activity. This could be pacing, squirming and fidgeting, or finger and toe tapping.  Impulsive behavior and poor judgment. This may result in high-risk activities, such as having unprotected sex or spending a lot of money. Symptoms of depression include:  Feeling sad, hopeless, or helpless.  Frequent or uncontrollable crying.  Lack of feeling or caring about anything.  Sleeping too much.  Moving more slowly than  usual.  Not being able to enjoy things you used to enjoy.  Wanting to be alone all the time.  Feeling guilty or worthless.  Lack of energy or motivation.  Trouble concentrating or remembering.  Trouble making decisions.  Increased appetite.  Thoughts of death, or the desire to harm yourself. Sometimes, you may have a mixed mood. This means having symptoms of depression and mania. Stress can make symptoms worse. How is this diagnosed? To diagnose bipolar disorder, your health care provider may ask about your:  Emotional episodes.  Medical history.  Alcohol and drug use. This includes prescription medicines. Certain medical conditions and substances can cause symptoms that seem like bipolar disorder (secondary bipolar disorder). How is this treated? Bipolar disorder is a long-term (chronic) illness. It is best controlled with ongoing (continuous) treatment rather than treatment only when symptoms occur. Treatment may include:  Medicine. Medicine can be prescribed by a provider who specializes in treating mental disorders (psychiatrist). ? Medicines called mood stabilizers are usually prescribed. ? If symptoms occur even while taking a mood stabilizer, other medicines may be added.  Psychotherapy. Some forms of talk therapy, such as cognitive-behavioral therapy (CBT), can provide support, education, and guidance.  Coping methods, such as journaling or relaxation exercises. These may include: ? Yoga. ? Meditation. ? Deep breathing.  Lifestyle changes, such as: ? Limiting alcohol and drug use. ? Exercising regularly. ? Getting plenty of sleep. ? Making healthy eating choices.  A combination of medicine, talk therapy, and coping methods is best. A procedure in which electricity is applied to the brain through the scalp (electroconvulsive therapy) may be used in cases of severe mania when medicine and psychotherapy work too  slowly or do not work. Follow these instructions at  home: Activity   Return to your normal activities as told by your health care provider.  Find activities that you enjoy, and make time to do them.  Exercise regularly as told by your health care provider. Lifestyle  Limit alcohol intake to no more than 1 drink a day for nonpregnant women and 2 drinks a day for men. One drink equals 12 oz of beer, 5 oz of wine, or 1 oz of hard liquor.  Follow a set schedule for eating and sleeping.  Eat a balanced diet that includes fresh fruits and vegetables, whole grains, low-fat dairy, and lean meat.  Get 7-8 hours of sleep each night. General instructions  Take over-the-counter and prescription medicines only as told by your health care provider.  Think about joining a support group. Your health care provider may be able to recommend a support group.  Talk with your family and loved ones about your treatment goals and how they can help.  Keep all follow-up visits as told by your health care provider. This is important. Where to find more information For more information about bipolar disorder, visit the following websites:  [employer] on Mental Illness: www.nami.Roy: https://carter.com/ Contact a health care provider if:  Your symptoms get worse.  You have side effects from your medicine, and they get worse.  You have trouble sleeping.  You have trouble doing daily activities.  You feel unsafe in your surroundings.  You are dealing with substance abuse. Get help right away if:  You have new symptoms.  You have thoughts about harming yourself.  You self-harm. This information is not intended to replace advice given to you by your health care provider. Make sure you discuss any questions you have with your health care provider. Document Released: 03/26/2000 Document Revised: 08/14/2015 Document Reviewed: 08/18/2015 Elsevier Interactive Patient Education  [employer].

## 2018-05-18 NOTE — Tx Team (Signed)
Initial Treatment Plan 05/18/2018 4:55 PM [name] [id number]:[id number]    PATIENT STRESSORS: Pt denies any stressors  PATIENT STRENGTHS: Ability for insight Supportive family/friends   PATIENT IDENTIFIED PROBLEMS: Unable to assess. Pt unable to answer                      DISCHARGE CRITERIA:  Adequate post-discharge living arrangements Improved stabilization in mood, thinking, and/or behavior  PRELIMINARY DISCHARGE PLAN: Attend aftercare/continuing care group Attend PHP/IOP  PATIENT/FAMILY INVOLVEMENT: This treatment plan has been presented to and reviewed with the patient, [name], and/or family member.  The patient and family have been given the opportunity to ask questions and make suggestions.  Marissa Calamity, RN 05/18/2018, 4:55 PM

## 2018-05-18 NOTE — Progress Notes (Signed)
Patient ID: [name], female   DOB: 1970/08/01, 48 y.o.   MRN: [id number] D: Patient continues to be paranoid, hyper-religious. She is observed reading a Bible throughout the day. She won't eat anything, unless it is packaged. When given her morning meds, she dumped out the water and filled the cup with tap water. She generally keeps to herself. She is very irritable and tends to be suspicious and angry with staff. She takes frequent naps throughout the day, and slept well last night. She denies SI/HI/AVH. She is medication compliant.  A: Provided support and encouragement. Educated on medications. Provided packaged food along with regular meal tray. R: Patient isolative, but medication compliant.

## 2018-05-18 NOTE — Progress Notes (Signed)
   05/18/18 0752  COVID-19 Daily Checkoff  Have you had a fever (temp > 37.80C/100F)  in the past 24 hours?  No  If you have had runny nose, nasal congestion, sneezing in the past 24 hours, has it worsened? No  COVID-19 EXPOSURE  Have you traveled outside the state in the past 14 days? No  Have you been in contact with someone with a confirmed diagnosis of COVID-19 or PUI in the past 14 days without wearing appropriate PPE? No  Have you been living in the same home as a person with confirmed diagnosis of COVID-19 or a PUI (household contact)? No  Have you been diagnosed with COVID-19? No

## 2018-05-18 NOTE — Progress Notes (Signed)
Pt admitted to the adult unit from the OBS unit under IVC. Pt expressed that she was brought to the hospital by a police officer, pt would not elaborate on why she was brought to the hospital. Pt noted to have disorganized thoughts and loose association when answering question.  Pt noted to be hyper-religious during the admission process and would say things like "only God knows and only God sees." During the admission process, the pt would repeatedly say "I am a Christian."  Pt denies AVH. Pt denies paranoia. Pt denies SI/HI. Pt denies using any illicit drugs, alcohol or tobacco. Pt verbalized that she lives at home with her 3 children ages 54, 36 & 46 years old.

## 2018-05-18 NOTE — BHH Group Notes (Signed)
[facility] LCSW Group Therapy Note  Date/Time:  05/18/2018  11:00AM-12:00PM  Type of Therapy and Topic:  Group Therapy:  Music and Mood  Participation Level:  Did Not Attend   Description of Group: In this process group, members listened to a variety of genres of music and identified that different types of music evoke different responses.  Patients were encouraged to identify music that was soothing for them and music that was energizing for them.  Patients discussed how this knowledge can help with wellness and recovery in various ways including managing depression and anxiety as well as encouraging healthy sleep habits.    Therapeutic Goals: 1. Patients will explore the impact of different varieties of music on mood 2. Patients will verbalize the thoughts they have when listening to different types of music 3. Patients will identify music that is soothing to them as well as music that is energizing to them 4. Patients will discuss how to use this knowledge to assist in maintaining wellness and recovery 5. Patients will explore the use of music as a coping skill  Summary of Patient Progress:   N/A  Therapeutic Modalities: Solution Focused Brief Therapy Activity   [name]

## 2018-05-18 NOTE — Progress Notes (Signed)
D: Patient observed in dayroom however keeps to self. On approach patient is cautious, guarded, and standoffish. Patient quietly states she is fine and gives one word answers. Status likely remains unchanged from this afternoon as patient is reluctant to engage. Patient's affect preoccupied, suspicious with congruent mood. Denies pain, physical complaints. COVID-19 screen negative, afebrile. Respiratory assessment WDL.  A: Medicated per orders, refused prn for sleep as she states she is sleepy. Medication education provided. Level III obs in place for safety. Emotional support offered. Encouraged to attend and participate in unit programming.   R: Patient verbalizes some understanding of POC. Patient denies SI/HI/AVH and remains safe on level III obs. Will continue to monitor throughout the night.

## 2018-05-18 NOTE — Progress Notes (Signed)
[facility] MD Progress Note  05/18/2018 10:29 AM [name]  MRN:  [id number] Subjective:  ''People are messing with my names here, spelling it wrong and I want them to correct it.''  Objective: Patient seen, interviewed and case discussed with treatment team. She continues to self isolate with minimal interaction with peers or staff. She is still paranoid, irritable, disorganised and hyper-religious. Reading the Bible constantly,  and fixated on staff spelling her name wrong and won't eat anything, unless it is packaged. She reports having a lot of stress in her life and feeling overwhelmed in general. She denies SI/HI, drugs and alcohol abuse.  Principal Problem: Schizoaffective disorder ([place]) Diagnosis: Principal Problem:   Schizoaffective disorder ([place])  Total Time spent with patient: 30 minutes  Past Psychiatric History: Bipolar disorder. Past history of admission at [facility] inpatient psych facility.  Past Medical History:  Past Medical History:  Diagnosis Date  . Anxiety   . Depression   . Hypertension     Past Surgical History:  Procedure Laterality Date  . CESAREAN SECTION  2002, 2009   Family History:  Family History  Problem Relation Age of Onset  . Breast cancer Mother        Deceased, 80  . Hypertension Mother   . Arthritis Father   . Hypertension Father   . Healthy Son   . Asthma Daughter   . Diabetes type II Daughter    Family Psychiatric  History:  Social History:  Social History   Substance and Sexual Activity  Alcohol Use No     Social History   Substance and Sexual Activity  Drug Use No    Social History   Socioeconomic History  . Marital status: Married    Spouse name: Not on file  . Number of children: Not on file  . Years of education: Not on file  . Highest education level: Not on file  Occupational History  . Not on file  Social Needs  . Financial resource strain: Not on file  . Food insecurity:    Worry: Not on  file    Inability: Not on file  . Transportation needs:    Medical: Not on file    Non-medical: Not on file  Tobacco Use  . Smoking status: Never Smoker  . Smokeless tobacco: Never Used  Substance and Sexual Activity  . Alcohol use: No  . Drug use: No  . Sexual activity: Not on file  Lifestyle  . Physical activity:    Days per week: Not on file    Minutes per session: Not on file  . Stress: Not on file  Relationships  . Social connections:    Talks on phone: Not on file    Gets together: Not on file    Attends religious service: Not on file    Active member of club or organization: Not on file    Attends meetings of clubs or organizations: Not on file    Relationship status: Not on file  Other Topics Concern  . Not on file  Social History Narrative   She lives at home with 2 sons (50, 13) and 1 daughter (65).   She is currently not working.  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  She moved from [place] in 2013.   Additional Social History:    Pain Medications: None reported Prescriptions: None reported Over the Counter: None reported History of alcohol / drug use?: No  history of alcohol / drug abuse                    Sleep: Poor  Appetite:  Fair  Current Medications: Current Facility-Administered Medications  Medication Dose Route Frequency Provider Last Rate Last Dose  . acetaminophen (TYLENOL) tablet 650 mg  650 mg Oral Q6H PRN Money, Lowry Ram, FNP      . alum & mag hydroxide-simeth (MAALOX/MYLANTA) 200-200-20 MG/5ML suspension 30 mL  30 mL Oral Q4H PRN Money, Darnelle Maffucci B, FNP      . diphenhydrAMINE (BENADRYL) capsule 50 mg  50 mg Oral Q6H PRN Money, Lowry Ram, FNP       Or  . diphenhydrAMINE (BENADRYL) injection 50 mg  50 mg Intramuscular Q6H PRN Money, Darnelle Maffucci B, FNP      . divalproex (DEPAKOTE) DR tablet 500 mg  500 mg Oral Q12H Lord, Jamison Y, NP      . haloperidol (HALDOL) tablet 5 mg  5 mg Oral Q6H PRN  Money, Darnelle Maffucci B, FNP       Or  . haloperidol lactate (HALDOL) injection 5 mg  5 mg Intramuscular Q6H PRN Money, Lowry Ram, FNP      . hydrOXYzine (ATARAX/VISTARIL) tablet 25 mg  25 mg Oral TID PRN Money, Lowry Ram, FNP      . LORazepam (ATIVAN) tablet 2 mg  2 mg Oral Q6H PRN Money, Lowry Ram, FNP       Or  . LORazepam (ATIVAN) injection 2 mg  2 mg Intramuscular Q6H PRN Money, Darnelle Maffucci B, FNP      . magnesium hydroxide (MILK OF MAGNESIA) suspension 30 mL  30 mL Oral Daily PRN Money, Darnelle Maffucci B, FNP      . paliperidone (INVEGA) 24 hr tablet 6 mg  6 mg Oral Daily Cayleb Jarnigan, MD   6 mg at 05/18/18 0752  . traZODone (DESYREL) tablet 100 mg  100 mg Oral QHS PRN Corena Pilgrim, MD        Lab Results:  Results for orders placed or performed during the hospital encounter of 05/16/18 (from the past 48 hour(s))  Urine rapid drug screen (hosp performed)not at [facility]     Status: None   Collection Time: 05/17/18  8:44 PM  Result Value Ref Range   Opiates NONE DETECTED NONE DETECTED   Cocaine NONE DETECTED NONE DETECTED   Benzodiazepines NONE DETECTED NONE DETECTED   Amphetamines NONE DETECTED NONE DETECTED   Tetrahydrocannabinol NONE DETECTED NONE DETECTED   Barbiturates NONE DETECTED NONE DETECTED    Comment: (NOTE) DRUG SCREEN FOR MEDICAL PURPOSES ONLY.  IF CONFIRMATION IS NEEDED FOR ANY PURPOSE, NOTIFY LAB WITHIN 5 DAYS. LOWEST DETECTABLE LIMITS FOR URINE DRUG SCREEN Drug Class                     Cutoff (ng/mL) Amphetamine and metabolites    1000 Barbiturate and metabolites    200 Benzodiazepine                 093 Tricyclics and metabolites     300 Opiates and metabolites        300 Cocaine and metabolites        300 THC                            50 Performed at [facility], [place] [address], [place], [place] [address]     Blood Alcohol level:  Lab Results  Component Value Date   ETH <10 12/27/2016   ETH  03/22/2007    <5        LOWEST DETECTABLE LIMIT  FOR SERUM ALCOHOL IS 11 mg/dL FOR MEDICAL PURPOSES ONLY    Metabolic Disorder Labs: Lab Results  Component Value Date   HGBA1C 5.1 12/29/2016   MPG 99.67 12/29/2016   No results found for: PROLACTIN Lab Results  Component Value Date   CHOL 147 12/29/2016   TRIG 43 12/29/2016   HDL 54 12/29/2016   CHOLHDL 2.7 12/29/2016   VLDL 9 12/29/2016   LDLCALC 84 12/29/2016    Physical Findings: AIMS: Facial and Oral Movements Muscles of Facial Expression: None, normal Lips and Perioral Area: None, normal Jaw: None, normal Tongue: None, normal,Extremity Movements Upper (arms, wrists, hands, fingers): None, normal Lower (legs, knees, ankles, toes): None, normal, Trunk Movements Neck, shoulders, hips: None, normal, Overall Severity Severity of abnormal movements (highest score from questions above): None, normal Incapacitation due to abnormal movements: None, normal Patient's awareness of abnormal movements (rate only patient's report): No Awareness, Dental Status Current problems with teeth and/or dentures?: No Does patient usually wear dentures?: No  CIWA:  CIWA-Ar Total: 3 COWS:  COWS Total Score: 2  Musculoskeletal: Strength & Muscle Tone: within normal limits Gait & Station: normal Patient leans: N/A  Psychiatric Specialty Exam: Physical Exam  Psychiatric: Her affect is angry. Her speech is delayed. She is agitated.    ROS  Blood pressure (!) 139/112, pulse (!) 104, temperature 98.9 F (37.2 C), temperature source Oral, SpO2 100 %.There is no height or weight on file to calculate BMI.  General Appearance: Bizarre  Eye Contact:  Minimal  Speech:  Slow  Volume:  Increased  Mood:  Angry and Irritable  Affect:  Labile  Thought Process:  Disorganized  Orientation:  Full (Time, Place, and Person)  Thought Content:  Delusions and Hallucinations: Auditory  Suicidal Thoughts:  No  Homicidal Thoughts:  No  Memory:  Immediate;   Fair Recent;   Fair Remote;   Fair   Judgement:  Impaired  Insight:  Lacking  Psychomotor Activity:  Increased  Concentration:  Concentration: Fair and Attention Span: Fair  Recall:  [employer] of Knowledge:  Fair  Language:  Good  Akathisia:  No  Handed:  Right  AIMS (if indicated):     Assets:  Communication Skills Social Support Others:  family support  ADL's:  Intact  Cognition:  WNL  Sleep:   poor     Treatment Plan Summary: Daily contact with patient to assess and evaluate symptoms and progress in treatment and Medication management  -Continue Invega 6 mg daily for psychosis/delusions -Increase Depakote to 500 mg bid for bipolar -Continue Trazodone 100 mg 1 tablet at bedtime as needed for Insomnia  Disposition: Patient will benefit from psychiatric inpatient admission for stabilization   Corena Pilgrim, MD 05/18/2018, 10:29 AM

## 2018-05-19 DIAGNOSIS — F3164 Bipolar disorder, current episode mixed, severe, with psychotic features: Principal | ICD-10-CM

## 2018-05-19 MED ORDER — LORATADINE 10 MG PO TABS
10.0000 mg | ORAL_TABLET | Freq: Every day | ORAL | Status: DC
  Administered 2018-05-19 – 2018-05-26 (×8): 10 mg via ORAL
  Filled 2018-05-19 (×10): qty 1

## 2018-05-19 MED ORDER — TEMAZEPAM 30 MG PO CAPS
30.0000 mg | ORAL_CAPSULE | Freq: Every day | ORAL | Status: DC
  Administered 2018-05-19 – 2018-05-23 (×4): 30 mg via ORAL
  Filled 2018-05-19 (×3): qty 1

## 2018-05-19 MED ORDER — BENZTROPINE MESYLATE 0.5 MG PO TABS
0.5000 mg | ORAL_TABLET | Freq: Two times a day (BID) | ORAL | Status: DC
  Administered 2018-05-19 – 2018-05-26 (×14): 0.5 mg via ORAL
  Filled 2018-05-19 (×16): qty 1

## 2018-05-19 MED ORDER — RISPERIDONE 3 MG PO TABS
3.0000 mg | ORAL_TABLET | Freq: Two times a day (BID) | ORAL | Status: DC
  Administered 2018-05-19 – 2018-05-20 (×2): 3 mg via ORAL
  Filled 2018-05-19 (×6): qty 1

## 2018-05-19 MED ORDER — ARIPIPRAZOLE 2 MG PO TABS
2.0000 mg | ORAL_TABLET | Freq: Once | ORAL | Status: AC
  Administered 2018-05-19: 2 mg via ORAL
  Filled 2018-05-19 (×2): qty 1

## 2018-05-19 NOTE — Tx Team (Addendum)
Interdisciplinary Treatment and Diagnostic Plan Update  05/20/2018 Time of Session: [place] MRN: [id number]  Principal Diagnosis: Bipolar affective disorder, mixed, severe, with psychotic behavior ([place])  Secondary Diagnoses: Principal Problem:   Bipolar affective disorder, mixed, severe, with psychotic behavior ([place]) Active Problems:   Bipolar affective disorder, current episode manic ([place])   Bipolar affective disorder, current episode manic with psychotic symptoms ([place])   Current Medications:  Current Facility-Administered Medications  Medication Dose Route Frequency Provider Last Rate Last Dose  . acetaminophen (TYLENOL) tablet 650 mg  650 mg Oral Q6H PRN Money, Lowry Ram, FNP      . alum & mag hydroxide-simeth (MAALOX/MYLANTA) 200-200-20 MG/5ML suspension 30 mL  30 mL Oral Q4H PRN Money, Darnelle Maffucci B, FNP      . benztropine (COGENTIN) tablet 0.5 mg  0.5 mg Oral BID [name], MD   0.5 mg at 05/20/18 0800  . diphenhydrAMINE (BENADRYL) capsule 50 mg  50 mg Oral Q6H PRN Money, Lowry Ram, FNP       Or  . diphenhydrAMINE (BENADRYL) injection 50 mg  50 mg Intramuscular Q6H PRN Money, Darnelle Maffucci B, FNP      . divalproex (DEPAKOTE) DR tablet 500 mg  500 mg Oral Q12H [name], NP   500 mg at 05/20/18 0800  . haloperidol (HALDOL) tablet 5 mg  5 mg Oral Q6H PRN Money, Lowry Ram, FNP       Or  . haloperidol lactate (HALDOL) injection 5 mg  5 mg Intramuscular Q6H PRN Money, Lowry Ram, FNP      . hydrOXYzine (ATARAX/VISTARIL) tablet 25 mg  25 mg Oral TID PRN Money, Lowry Ram, FNP      . loratadine (CLARITIN) tablet 10 mg  10 mg Oral Daily [name], MD   10 mg at 05/20/18 0800  . LORazepam (ATIVAN) tablet 2 mg  2 mg Oral Q6H PRN Money, Lowry Ram, FNP       Or  . LORazepam (ATIVAN) injection 2 mg  2 mg Intramuscular Q6H PRN Money, Darnelle Maffucci B, FNP      . magnesium hydroxide (MILK OF MAGNESIA) suspension 30 mL  30 mL Oral Daily PRN Money, Lowry Ram, FNP      . risperiDONE  (RISPERDAL) tablet 3 mg  3 mg Oral BID [name], MD   3 mg at 05/20/18 0800  . temazepam (RESTORIL) capsule 30 mg  30 mg Oral QHS [name], MD   30 mg at 05/19/18 2058  . traZODone (DESYREL) tablet 100 mg  100 mg Oral QHS PRN Akintayo, [name], MD       PTA Medications: Medications Prior to Admission  Medication Sig Dispense Refill Last Dose  . divalproex (DEPAKOTE) 250 MG DR tablet Take 1 tablet (250 mg total) by mouth every 12 (twelve) hours. (Patient not taking: Reported on 05/17/2018) 60 tablet 1 Not Taking at Unknown time  . docusate sodium (COLACE) 100 MG capsule Take 1 capsule (100 mg total) by mouth daily with breakfast. (Patient not taking: Reported on 05/17/2018) 30 capsule 1 Not Taking at Unknown time  . ferrous sulfate 325 (65 FE) MG tablet Take 1 tablet (325 mg total) by mouth daily with breakfast. (Patient not taking: Reported on 05/17/2018) 30 tablet 3 Not Taking at Unknown time  . haloperidol decanoate (HALDOL DECANOATE) 100 MG/ML injection Inject 1 mL (100 mg total) into the muscle every 28 (twenty-eight) days. (Patient not taking: Reported on 05/17/2018) 1 mL 1 Not Taking at Unknown time  .  OLANZapine (ZYPREXA) 15 MG tablet Take 2 tablets (30 mg total) by mouth at bedtime. (Patient not taking: Reported on 05/17/2018) 60 tablet 1 Not Taking at Unknown time  . traZODone (DESYREL) 100 MG tablet Take 1 tablet (100 mg total) by mouth at bedtime as needed for sleep. (Patient not taking: Reported on 05/17/2018) 30 tablet 1 Not Taking at Unknown time    Patient Stressors:    Patient Strengths: Ability for insight Supportive family/friends  Treatment Modalities: Medication Management, Group therapy, Case management,  1 to 1 session with clinician, Psychoeducation, Recreational therapy.   Physician Treatment Plan for Primary Diagnosis: Bipolar affective disorder, mixed, severe, with psychotic behavior ([place]) Long Term Goal(s): Improvement in symptoms so as ready for  discharge Improvement in symptoms so as ready for discharge   Short Term Goals: Ability to demonstrate self-control will improve Ability to identify and develop effective coping behaviors will improve  Medication Management: Evaluate patient's response, side effects, and tolerance of medication regimen.  Therapeutic Interventions: 1 to 1 sessions, Unit Group sessions and Medication administration.  Evaluation of Outcomes: Not Met  Physician Treatment Plan for Secondary Diagnosis: Principal Problem:   Bipolar affective disorder, mixed, severe, with psychotic behavior ([place]) Active Problems:   Bipolar affective disorder, current episode manic ([place])   Bipolar affective disorder, current episode manic with psychotic symptoms ([place])  Long Term Goal(s): Improvement in symptoms so as ready for discharge Improvement in symptoms so as ready for discharge   Short Term Goals: Ability to demonstrate self-control will improve Ability to identify and develop effective coping behaviors will improve     Medication Management: Evaluate patient's response, side effects, and tolerance of medication regimen.  Therapeutic Interventions: 1 to 1 sessions, Unit Group sessions and Medication administration.  Evaluation of Outcomes: Not Met   RN Treatment Plan for Primary Diagnosis: Bipolar affective disorder, mixed, severe, with psychotic behavior ([place]) Long Term Goal(s): Knowledge of disease and therapeutic regimen to maintain health will improve  Short Term Goals: Ability to identify and develop effective coping behaviors will improve and Compliance with prescribed medications will improve  Medication Management: RN will administer medications as ordered by provider, will assess and evaluate patient's response and provide education to patient for prescribed medication. RN will report any adverse and/or side effects to prescribing provider.  Therapeutic Interventions: 1 on 1 counseling sessions,  Psychoeducation, Medication administration, Evaluate responses to treatment, Monitor vital signs and CBGs as ordered, Perform/monitor CIWA, COWS, AIMS and Fall Risk screenings as ordered, Perform wound care treatments as ordered.  Evaluation of Outcomes: Not Met   LCSW Treatment Plan for Primary Diagnosis: Bipolar affective disorder, mixed, severe, with psychotic behavior ([place]) Long Term Goal(s): Safe transition to appropriate next level of care at discharge, Engage patient in therapeutic group addressing interpersonal concerns.  Short Term Goals: Engage patient in aftercare planning with referrals and resources, Increase social support and Increase skills for wellness and recovery  Therapeutic Interventions: Assess for all discharge needs, 1 to 1 time with Social worker, Explore available resources and support systems, Assess for adequacy in community support network, Educate family and significant other(s) on suicide prevention, Complete Psychosocial Assessment, Interpersonal group therapy.  Evaluation of Outcomes: Not Met   Progress in Treatment: Attending groups: No. Participating in groups: No. Taking medication as prescribed: Yes. Toleration medication: Yes. Family/Significant other contact made: No, will contact:  son Patient understands diagnosis: No. Discussing patient identified problems/goals with staff: Yes. Medical problems stabilized or resolved: Yes. Denies suicidal/homicidal ideation: Yes. Issues/concerns  per patient self-inventory: No. Other: none  New problem(s) identified: No, Describe:  none  New Short Term/Long Term Goal(s):  Patient Goals:  "help in dealing with the crisis"  Discharge Plan or Barriers:   Reason for Continuation of Hospitalization: Delusions  Hallucinations Medication stabilization  Estimated Length of Stay: 5-7 days  Attendees: Patient: [name] 05/20/2018   Physician: Dr. Jake Samples, MD 05/20/2018   Nursing: Sena Hitch, RN 05/20/2018   RN Care Manager: 05/20/2018   Social Worker: Lurline Idol, LCSW 05/20/2018   Recreational Therapist:  05/20/2018   Other:  05/20/2018   Other:  05/20/2018   Other: 05/20/2018     Scribe for Treatment Team: Joanne Chars, [place] 05/20/2018 8:20 AM

## 2018-05-19 NOTE — Plan of Care (Signed)
D: Patient is cooperative. Denies SI, HI, AVH, and verbally contracts for safety. Patient interaction is minimal. Patient denies physical symptoms/pain.    A: Medications administered per MD order. Support provided. Patient educated on safety on the unit and medications. Routine safety checks every 15 minutes. Patient stated understanding to tell nurse about any new physical symptoms. Patient understands to tell staff of any needs.     R: No adverse drug reactions noted. Patient verbally contracts for safety. Patient remains safe at this time and will continue to monitor.   Problem: Safety: Goal: Ability to remain free from injury will improve Outcome: Progressing   Patient remains safe and will continue to monitor.   [place] NOVEL CORONAVIRUS (COVID-19) DAILY CHECK-OFF SYMPTOMS - answer yes or no to each - every day NO YES  Have you had a fever in the past 24 hours?  Fever (Temp > 37.80C / 100F) X   Have you had any of these symptoms in the past 24 hours? New Cough  Sore Throat   Shortness of Breath  Difficulty Breathing  Unexplained Body Aches   X   Have you had any one of these symptoms in the past 24 hours not related to allergies?   Runny Nose  Nasal Congestion  Sneezing   X   If you have had runny nose, nasal congestion, sneezing in the past 24 hours, has it worsened?  X   EXPOSURES - check yes or no X   Have you traveled outside the state in the past 14 days?  X   Have you been in contact with someone with a confirmed diagnosis of COVID-19 or PUI in the past 14 days without wearing appropriate PPE?  X   Have you been living in the same home as a person with confirmed diagnosis of COVID-19 or a PUI (household contact)?    X   Have you been diagnosed with COVID-19?    X              What to do next: Answered NO to all: Answered YES to anything:   Proceed with unit schedule Follow the [facility] Inpatient Flowsheet.

## 2018-05-19 NOTE — [facility] Group Notes (Signed)
[place] LCSW Group Therapy Note  Date/Time: 05/19/18, 1315  Type of Therapy and Topic:  Group Therapy:  Overcoming Obstacles  Participation Level:  minimal  Description of Group:    In this group patients will be encouraged to explore what they see as obstacles to their own wellness and recovery. They will be guided to discuss their thoughts, feelings, and behaviors related to these obstacles. The group will process together ways to cope with barriers, with attention given to specific choices patients can make. Each patient will be challenged to identify changes they are motivated to make in order to overcome their obstacles. This group will be process-oriented, with patients participating in exploration of their own experiences as well as giving and receiving support and challenge from other group members.  Therapeutic Goals: 1. Patient will identify personal and current obstacles as they relate to admission. 2. Patient will identify barriers that currently interfere with their wellness or overcoming obstacles.  3. Patient will identify feelings, thought process and behaviors related to these barriers. 4. Patient will identify two changes they are willing to make to overcome these obstacles:    Summary of Patient Progress: Pt shared that her obstacle is "the crisis" and continues to refer to this, which she also talked about during PSA.  Pt was mostly quiet during group discussion today but did appear to be attentive to that discussion going on around her.       Therapeutic Modalities:   Cognitive Behavioral Therapy Solution Focused Therapy Motivational Interviewing Relapse Prevention Therapy  Lurline Idol, LCSW

## 2018-05-19 NOTE — Progress Notes (Signed)
Patient denied SI and HI, contracts for safety.  Denied A/V hallucinations.   Medications administered per MD orders.  Emotional support and encouragements given patient.  Safety maintained with 15 minute checks.

## 2018-05-19 NOTE — [facility] Suicide Risk Assessment (Signed)
[facility] Admission Suicide Risk Assessment   Nursing information obtained from:  Review of record Demographic factors:  Low socioeconomic status, Living alone Current Mental Status:  NA Loss Factors:  Financial problems / change in socioeconomic status Historical Factors:  Impulsivity Risk Reduction Factors:  Responsible for children under 46 years of age  Total Time spent with patient: 45 minutes Principal Problem: Bipolar affective disorder, mixed, severe, with psychotic behavior ([place]) Diagnosis:  Principal Problem:   Bipolar affective disorder, mixed, severe, with psychotic behavior ([place]) Active Problems:   Bipolar affective disorder, current episode manic ([place])   Bipolar affective disorder, current episode manic with psychotic symptoms ([place])  Subjective Data: Paranoid and vague  Continued Clinical Symptoms:  Alcohol Use Disorder Identification Test Final Score (AUDIT): 0 The "Alcohol Use Disorders Identification Test", Guidelines for Use in Primary Care, Second Edition.  World Pharmacologist [facility]). Score between 0-7:  no or low risk or alcohol related problems. Score between 8-15:  moderate risk of alcohol related problems. Score between 16-19:  high risk of alcohol related problems. Score 20 or above:  warrants further diagnostic evaluation for alcohol dependence and treatment.   CLINICAL FACTORS:   Bipolar Disorder:   Mixed State  Musculoskeletal: Strength & Muscle Tone: within normal limits Gait & Station: normal Patient leans: N/A  Psychiatric Specialty Exam: Physical Exam  ROS  Blood pressure 123/89, pulse 86, temperature 98 F (36.7 C), temperature source Oral, resp. rate 18, height 5\' 7"  (1.702 m), weight 77.6 kg, last menstrual period 05/16/2018, SpO2 100 %.Body mass index is 26.78 kg/m.  General Appearance: Casual  Eye Contact:  Fair  Speech:  Clear and Coherent  Volume:  Normal  Mood:  Irritable and yet contained   Affect:  Congruent and Constricted   Thought Process:  Irrelevant, Linear and Descriptions of Associations: Circumstantial  Orientation:  Full (Time, Place, and Person)  Thought Content:  Paranoid Ideation, Rumination and Tangential  Suicidal Thoughts:  No  Homicidal Thoughts:  No  Memory:  Immediate;   Fair  Judgement:  Impaired  Insight:  Lacking  Psychomotor Activity:  Normal  Concentration:  Concentration: Fair  Recall:  [employer] of Knowledge:  Fair  Language:  Fair  Akathisia:  Negative  Handed:  Right  AIMS (if indicated):     Assets:  Communication Skills Desire for Improvement-rather desire for discharge  ADL's:  Intact  Cognition:  WNL  Sleep:  Number of Hours: 5.75       COGNITIVE FEATURES THAT CONTRIBUTE TO RISK:  Loss of executive function    SUICIDE RISK:   Minimal: No identifiable suicidal ideation.  Patients presenting with no risk factors but with morbid ruminations; may be classified as minimal risk based on the severity of the depressive symptoms  PLAN OF CARE: Notable meds and adjustments  I certify that inpatient services furnished can reasonably be expected to improve the patient's condition.   [name], MD 05/19/2018, 11:50 AM

## 2018-05-19 NOTE — Progress Notes (Signed)
Recreation Therapy Notes  INPATIENT RECREATION THERAPY ASSESSMENT  Patient Details Name: Mauriah Mcmillen MRN: [id number] DOB: 08/11/1970 Today's Date: 05/19/2018       Information Obtained From: Patient  Able to Participate in Assessment/Interview: Yes  Patient Presentation: Alert  Reason for Admission (Per Patient): Other (Comments)(Pt stated police officers brought her here.)  Patient Stressors: (Pt identified not stressors.)  Coping Skills:   Journal, Meditate, Talk, Prayer, Read  Leisure Interests (2+):  Individual - Reading, Social - Family, Individual - Other (Comment), Community - Other (Comment)(Meditate, Cleaning, Go out for appointments)  Frequency of Recreation/Participation: Weekly  Awareness of Community Resources:  No  Expressed Interest in [place]: No  County of Residence:  [place]  Patient Main Form of Transportation: Diplomatic Services operational officer  Patient Strengths:  Consulting civil engineer; Read Bible  Patient Identified Areas of Improvement:  "Crisis going on around me and my family; Prayer for coronovirus"  Patient Goal for Hospitalization:  "for incall hospitalities"  Current SI (including self-harm):  No  Current HI:  No  Current AVH: No  Staff Intervention Plan: Collaborate with Interdisciplinary Treatment Team, Group Attendance  Consent to Intern Participation: N/A    Victorino Sparrow, LRT/CTRS  Victorino Sparrow A 05/19/2018, 12:01 PM
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
## 2018-05-19 NOTE — Progress Notes (Signed)
Recreation Therapy Notes  Date: 5.18.20 Time: 1000 Location: 500 Dayroom  Group Topic: Coping Skills  Goal Area(s) Addresses:  Patient will identify positive coping skills. Patient will identify benefit of using coping skills. Patient will identify benefits to using coping skills post d/c.  Behavioral Response: Engaged  Intervention:  Worksheet  Activity:  Coping Skills A to Z.  Patients were to identify a positive coping skill for each letter of the alphabet.    Education: Radiographer, therapeutic, Dentist.   Education Outcome: Acknowledges understanding/In group clarification offered/Needs additional education.   Clinical Observations/Feedback: Pt was quiet but spoke when engaged.  Pt identified some of her coping skills as car for driving around, encourage, [name], listen, X-mas and woolaa, which pt stated means happy.    Victorino Sparrow, LRT/CTRS    Ria Comment, Andre Swander A 05/19/2018 11:15 AM
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
## 2018-05-20 MED ORDER — RISPERIDONE 3 MG PO TABS
6.0000 mg | ORAL_TABLET | Freq: Every day | ORAL | Status: DC
  Administered 2018-05-22 – 2018-05-25 (×4): 6 mg via ORAL
  Filled 2018-05-20 (×4): qty 2
  Filled 2018-05-20: qty 1
  Filled 2018-05-20 (×2): qty 2

## 2018-05-20 MED ORDER — RISPERIDONE 3 MG PO TABS
3.0000 mg | ORAL_TABLET | Freq: Every day | ORAL | Status: DC
  Administered 2018-05-20 – 2018-05-21 (×2): 3 mg via ORAL
  Filled 2018-05-20 (×3): qty 1

## 2018-05-20 NOTE — Progress Notes (Signed)
Recreation Therapy Notes  Date: 5.19.20 Time: 0955 Location: [place]  Group Topic: Wellness  Goal Area(s) Addresses:  Patient will define components of whole wellness. Patient will verbalize benefit of whole wellness.  Intervention:  Music  Activity:  Exercise.  LRT led group in a series of stretches to get loosened up.  Patients took turns leading the group in exercises of their choosing.  Patients were to get a least 30 minutes of exercise.  Patients could get water and take breaks as needed.  Education: Wellness, Dentist.   Education Outcome: Acknowledges education/In group clarification offered/Needs additional education.   Clinical Observations/Feedback: Pt did not attend group.    Victorino Sparrow, LRT/CTRS         Ria Comment, Jayvan Mcshan A 05/20/2018 11:08 AM

## 2018-05-20 NOTE — [facility] Group Notes (Signed)
[facility] LCSW Group Therapy Note  Date/Time: 05/20/18, 1100  Type of Therapy/Topic:  Group Therapy:  Balance in Life  Participation Level:  Did not attend  Description of Group:    This group will address the concept of balance and how it feels and looks when one is unbalanced. Patients will be encouraged to process areas in their lives that are out of balance, and identify reasons for remaining unbalanced. Facilitators will guide patients utilizing problem- solving interventions to address and correct the stressor making their life unbalanced. Understanding and applying boundaries will be explored and addressed for obtaining  and maintaining a balanced life. Patients will be encouraged to explore ways to assertively make their unbalanced needs known to significant others in their lives, using other group members and facilitator for support and feedback.  Therapeutic Goals: 1. Patient will identify two or more emotions or situations they have that consume much of in their lives. 2. Patient will identify signs/triggers that life has become out of balance:  3. Patient will identify two ways to set boundaries in order to achieve balance in their lives:  4. Patient will demonstrate ability to communicate their needs through discussion and/or role plays  Summary of Patient Progress:          Therapeutic Modalities:   Cognitive Behavioral Therapy Solution-Focused Therapy Assertiveness Training  Lurline Idol, LCSW

## 2018-05-20 NOTE — Progress Notes (Signed)
[facility] MD Progress Note  05/20/2018 11:41 AM [name]  MRN:  [id number] Subjective:   Patient generally cooperative and redirectable she continues to deny hallucinations and contracts for safety she is however extremely focused on removing her middle name from the correct record and has scratches off of sheets that she has in her possession with her name on them.  This may be a paranoid delusion Otherwise her paranoia is still very ill-defined, or least poorly elaborated at any rate she is compliant with meds thus far Principal Problem: Bipolar affective disorder, mixed, severe, with psychotic behavior ([place]) Diagnosis: Principal Problem:   Bipolar affective disorder, mixed, severe, with psychotic behavior ([place]) Active Problems:   Bipolar affective disorder, current episode manic ([place])   Bipolar affective disorder, current episode manic with psychotic symptoms ([place])  Total Time spent with patient: 20 minutes  Past Medical History:  Past Medical History:  Diagnosis Date  . Anxiety   . Depression   . Hypertension     Past Surgical History:  Procedure Laterality Date  . CESAREAN SECTION  2002, 2009   Family History:  Family History  Problem Relation Age of Onset  . Breast cancer Mother        Deceased, 59  . Hypertension Mother   . Arthritis Father   . Hypertension Father   . Healthy Son   . Asthma Daughter   . Diabetes type II Daughter    Family Psychiatric  History: neg Social History:  Social History   Substance and Sexual Activity  Alcohol Use No     Social History   Substance and Sexual Activity  Drug Use No    Social History   Socioeconomic History  . Marital status: Married    Spouse name: Not on file  . Number of children: Not on file  . Years of education: Not on file  . Highest education level: Not on file  Occupational History  . Not on file  Social Needs  . Financial resource strain: Not on file  . Food insecurity:    Worry: Not on  file    Inability: Not on file  . Transportation needs:    Medical: Not on file    Non-medical: Not on file  Tobacco Use  . Smoking status: Never Smoker  . Smokeless tobacco: Never Used  Substance and Sexual Activity  . Alcohol use: No  . Drug use: No  . Sexual activity: Not on file  Lifestyle  . Physical activity:    Days per week: Not on file    Minutes per session: Not on file  . Stress: Not on file  Relationships  . Social connections:    Talks on phone: Not on file    Gets together: Not on file    Attends religious service: Not on file    Active member of club or organization: Not on file    Attends meetings of clubs or organizations: Not on file    Relationship status: Not on file  Other Topics Concern  . Not on file  Social History Narrative   She lives at home with 2 sons (90, 61) and 1 daughter (64).   She is currently not working.  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  She moved from [place] in 2013.   Additional Social History:    Pain Medications: None reported Prescriptions: None reported Over the Counter: None reported History of alcohol / drug use?:  No history of alcohol / drug abuse                    Sleep: Fair  Appetite:  Fair  Current Medications: Current Facility-Administered Medications  Medication Dose Route Frequency Provider Last Rate Last Dose  . acetaminophen (TYLENOL) tablet 650 mg  650 mg Oral Q6H PRN Money, Lowry Ram, FNP      . alum & mag hydroxide-simeth (MAALOX/MYLANTA) 200-200-20 MG/5ML suspension 30 mL  30 mL Oral Q4H PRN Money, Darnelle Maffucci B, FNP      . benztropine (COGENTIN) tablet 0.5 mg  0.5 mg Oral BID [name], MD   0.5 mg at 05/20/18 0800  . diphenhydrAMINE (BENADRYL) capsule 50 mg  50 mg Oral Q6H PRN Money, Lowry Ram, FNP       Or  . diphenhydrAMINE (BENADRYL) injection 50 mg  50 mg Intramuscular Q6H PRN Money, Darnelle Maffucci B, FNP      . divalproex (DEPAKOTE) DR tablet 500 mg   500 mg Oral Q12H [name], NP   500 mg at 05/20/18 0800  . haloperidol (HALDOL) tablet 5 mg  5 mg Oral Q6H PRN Money, Lowry Ram, FNP       Or  . haloperidol lactate (HALDOL) injection 5 mg  5 mg Intramuscular Q6H PRN Money, Lowry Ram, FNP      . hydrOXYzine (ATARAX/VISTARIL) tablet 25 mg  25 mg Oral TID PRN Money, Lowry Ram, FNP      . loratadine (CLARITIN) tablet 10 mg  10 mg Oral Daily [name], MD   10 mg at 05/20/18 0800  . LORazepam (ATIVAN) tablet 2 mg  2 mg Oral Q6H PRN Money, Lowry Ram, FNP       Or  . LORazepam (ATIVAN) injection 2 mg  2 mg Intramuscular Q6H PRN Money, Darnelle Maffucci B, FNP      . magnesium hydroxide (MILK OF MAGNESIA) suspension 30 mL  30 mL Oral Daily PRN Money, Lowry Ram, FNP      . risperiDONE (RISPERDAL) tablet 3 mg  3 mg Oral Daily [name], MD      . Derrill Memo ON 05/21/2018] risperiDONE (RISPERDAL) tablet 6 mg  6 mg Oral QHS [name], MD      . temazepam (RESTORIL) capsule 30 mg  30 mg Oral QHS [name], MD   30 mg at 05/19/18 2058  . traZODone (DESYREL) tablet 100 mg  100 mg Oral QHS PRN Corena Pilgrim, MD        Lab Results: No results found for this or any previous visit (from the past 48 hour(s)).  Blood Alcohol level:  Lab Results  Component Value Date   ETH <10 12/27/2016   ETH  03/22/2007    <5        LOWEST DETECTABLE LIMIT FOR SERUM ALCOHOL IS 11 mg/dL FOR MEDICAL PURPOSES ONLY    Metabolic Disorder Labs: Lab Results  Component Value Date   HGBA1C 5.1 12/29/2016   MPG 99.67 12/29/2016   No results found for: PROLACTIN Lab Results  Component Value Date   CHOL 147 12/29/2016   TRIG 43 12/29/2016   HDL 54 12/29/2016   CHOLHDL 2.7 12/29/2016   VLDL 9 12/29/2016   LDLCALC 84 12/29/2016    Physical Findings: AIMS: Facial and Oral Movements Muscles of Facial Expression: None, normal Lips and Perioral Area: None, normal Jaw: None, normal Tongue: None, normal,Extremity Movements Upper (arms, wrists, hands, fingers): None,  normal Lower (legs, knees, ankles, toes): None,  normal, Trunk Movements Neck, shoulders, hips: None, normal, Overall Severity Severity of abnormal movements (highest score from questions above): None, normal Incapacitation due to abnormal movements: None, normal Patient's awareness of abnormal movements (rate only patient's report): No Awareness, Dental Status Current problems with teeth and/or dentures?: No Does patient usually wear dentures?: No  CIWA:  CIWA-Ar Total: 3 COWS:  COWS Total Score: 2  Musculoskeletal: Strength & Muscle Tone: within normal limits Gait & Station: normal Patient leans: N/A  Psychiatric Specialty Exam: Physical Exam  ROS  Blood pressure (!) 124/96, pulse (!) 113, temperature (!) 97.3 F (36.3 C), temperature source Oral, resp. rate 18, height 5\' 7"  (1.702 m), weight 77.6 kg, last menstrual period 05/16/2018, SpO2 100 %.Body mass index is 26.78 kg/m.  General Appearance: Guarded  Eye Contact:  Poor  Speech:  Clear and Coherent and Slow  Volume:  Decreased  Mood:  Dysphoric  Affect:  Flat  Thought Process:  Linear and Descriptions of Associations: Circumstantial  Orientation:  Full (Time, Place, and Person)  Thought Content:  Illogical, Delusions and Paranoid Ideation  Suicidal Thoughts:  No  Homicidal Thoughts:  No  Memory:  Immediate;   Good  Judgement:  Impaired  Insight:  Shallow  Psychomotor Activity:  Decreased  Concentration:  Concentration: Fair  Recall:  [employer] of Knowledge:  Fair  Language:  Fair  Akathisia:  Negative  Handed:  Right  AIMS (if indicated):     Assets:  Physical Health Resilience Social Support  ADL's:  Intact  Cognition:  WNL  Sleep:  Number of Hours: 6.75     Treatment Plan Summary: Daily contact with patient to assess and evaluate symptoms and progress in treatment, Medication management and Plan Collate Risperdal for psychosis continue to engage in cognitive and rehab and reality based therapies continue  current precautions and again continue to seek diagnostic clarity  Petar Mucci, MD 05/20/2018, 11:41 AM

## 2018-05-21 MED ORDER — DIVALPROEX SODIUM 500 MG PO DR TAB
500.0000 mg | DELAYED_RELEASE_TABLET | Freq: Every day | ORAL | Status: DC
  Administered 2018-05-22 – 2018-05-25 (×4): 500 mg via ORAL
  Filled 2018-05-21 (×5): qty 1

## 2018-05-21 NOTE — Plan of Care (Signed)
D: Patient demonstrates a marked decrease in irritability since her stay in the observation unit. She is more cooperative and pleasant. She continues to be isolative, taking naps in her room. She is guarded, likely paranoid, and forwards very little information. She is eating regular meals, not requiring packaged food anymore at mealtime.  A: Provided support and encouragement. Educated on medications. R: Patient is medication compliant.  Problem: Education: Goal: Emotional status will improve Outcome: Progressing Goal: Mental status will improve Outcome: Progressing   Problem: Activity: Goal: Interest or engagement in activities will improve Outcome: Progressing Goal: Sleeping patterns will improve Outcome: Progressing

## 2018-05-21 NOTE — Progress Notes (Signed)
[facility] MD Progress Note  05/21/2018 1:34 PM [name]  MRN:  [id number] Subjective:    Patient continues to be paranoid she describes "harassment" which "can take any form anything can harass you" but she cannot give me more specifics she states also that her family is going through a crisis and when asked for specifics states "all of her possessions are broken" and she cannot elaborate on who is doing this or why other than there are people organized against them when asked if she feels safe here she states "I cannot say if I am or I am not" she basically says there is no evidence either way but she is taking her medications but request reduction to reduce sedation so we will get rid of the morning Risperdal in the morning Depakote Principal Problem: Bipolar affective disorder, mixed, severe, with psychotic behavior ([place]) Diagnosis: Principal Problem:   Bipolar affective disorder, mixed, severe, with psychotic behavior ([place]) Active Problems:   Bipolar affective disorder, current episode manic ([place])   Bipolar affective disorder, current episode manic with psychotic symptoms ([place])  Total Time spent with patient: 20 minutes  Past Psychiatric History: 1 past admit  Past Medical History:  Past Medical History:  Diagnosis Date  . Anxiety   . Depression   . Hypertension     Past Surgical History:  Procedure Laterality Date  . CESAREAN SECTION  2002, 2009   Family History:  Family History  Problem Relation Age of Onset  . Breast cancer Mother        Deceased, 33  . Hypertension Mother   . Arthritis Father   . Hypertension Father   . Healthy Son   . Asthma Daughter   . Diabetes type II Daughter    Family Psychiatric  History: neg Social History:  Social History   Substance and Sexual Activity  Alcohol Use No     Social History   Substance and Sexual Activity  Drug Use No    Social History   Socioeconomic History  . Marital status: Married    Spouse name: Not  on file  . Number of children: Not on file  . Years of education: Not on file  . Highest education level: Not on file  Occupational History  . Not on file  Social Needs  . Financial resource strain: Not on file  . Food insecurity:    Worry: Not on file    Inability: Not on file  . Transportation needs:    Medical: Not on file    Non-medical: Not on file  Tobacco Use  . Smoking status: Never Smoker  . Smokeless tobacco: Never Used  Substance and Sexual Activity  . Alcohol use: No  . Drug use: No  . Sexual activity: Not on file  Lifestyle  . Physical activity:    Days per week: Not on file    Minutes per session: Not on file  . Stress: Not on file  Relationships  . Social connections:    Talks on phone: Not on file    Gets together: Not on file    Attends religious service: Not on file    Active member of club or organization: Not on file    Attends meetings of clubs or organizations: Not on file    Relationship status: Not on file  Other Topics Concern  . Not on file  Social History Narrative   She lives at home with 2 sons (33, 74) and 1 daughter (77).  She is currently not working.  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  She moved from [place] in 2013.   Additional Social History:    Pain Medications: None reported Prescriptions: None reported Over the Counter: None reported History of alcohol / drug use?: No history of alcohol / drug abuse                    Sleep: Fair  Appetite:  Fair  Current Medications: Current Facility-Administered Medications  Medication Dose Route Frequency Provider Last Rate Last Dose  . acetaminophen (TYLENOL) tablet 650 mg  650 mg Oral Q6H PRN Money, Lowry Ram, FNP      . alum & mag hydroxide-simeth (MAALOX/MYLANTA) 200-200-20 MG/5ML suspension 30 mL  30 mL Oral Q4H PRN Money, Darnelle Maffucci B, FNP      . benztropine (COGENTIN) tablet 0.5 mg  0.5 mg Oral BID [name], MD   0.5 mg  at 05/21/18 0819  . diphenhydrAMINE (BENADRYL) capsule 50 mg  50 mg Oral Q6H PRN Money, Darnelle Maffucci B, FNP       Or  . diphenhydrAMINE (BENADRYL) injection 50 mg  50 mg Intramuscular Q6H PRN Money, Lowry Ram, FNP      . [START ON 05/22/2018] divalproex (DEPAKOTE) DR tablet 500 mg  500 mg Oral QHS [name], MD      . haloperidol (HALDOL) tablet 5 mg  5 mg Oral Q6H PRN Money, Lowry Ram, FNP       Or  . haloperidol lactate (HALDOL) injection 5 mg  5 mg Intramuscular Q6H PRN Money, Lowry Ram, FNP      . hydrOXYzine (ATARAX/VISTARIL) tablet 25 mg  25 mg Oral TID PRN Money, Lowry Ram, FNP      . loratadine (CLARITIN) tablet 10 mg  10 mg Oral Daily [name], MD   10 mg at 05/21/18 0819  . LORazepam (ATIVAN) tablet 2 mg  2 mg Oral Q6H PRN Money, Lowry Ram, FNP       Or  . LORazepam (ATIVAN) injection 2 mg  2 mg Intramuscular Q6H PRN Money, Darnelle Maffucci B, FNP      . magnesium hydroxide (MILK OF MAGNESIA) suspension 30 mL  30 mL Oral Daily PRN Money, Lowry Ram, FNP      . risperiDONE (RISPERDAL) tablet 6 mg  6 mg Oral QHS [name], MD      . temazepam (RESTORIL) capsule 30 mg  30 mg Oral QHS [name], MD   30 mg at 05/20/18 2050  . traZODone (DESYREL) tablet 100 mg  100 mg Oral QHS PRN Corena Pilgrim, MD        Lab Results: No results found for this or any previous visit (from the past 48 hour(s)).  Blood Alcohol level:  Lab Results  Component Value Date   ETH <10 12/27/2016   ETH  03/22/2007    <5        LOWEST DETECTABLE LIMIT FOR SERUM ALCOHOL IS 11 mg/dL FOR MEDICAL PURPOSES ONLY    Metabolic Disorder Labs: Lab Results  Component Value Date   HGBA1C 5.1 12/29/2016   MPG 99.67 12/29/2016   No results found for: PROLACTIN Lab Results  Component Value Date   CHOL 147 12/29/2016   TRIG 43 12/29/2016   HDL 54 12/29/2016   CHOLHDL 2.7 12/29/2016   VLDL 9 12/29/2016   LDLCALC 84 12/29/2016    Physical Findings: AIMS: Facial and Oral Movements Muscles of  Facial Expression: None,  normal Lips and Perioral Area: None, normal Jaw: None, normal Tongue: None, normal,Extremity Movements Upper (arms, wrists, hands, fingers): None, normal Lower (legs, knees, ankles, toes): None, normal, Trunk Movements Neck, shoulders, hips: None, normal, Overall Severity Severity of abnormal movements (highest score from questions above): None, normal Incapacitation due to abnormal movements: None, normal Patient's awareness of abnormal movements (rate only patient's report): No Awareness, Dental Status Current problems with teeth and/or dentures?: No Does patient usually wear dentures?: No  CIWA:  CIWA-Ar Total: 0 COWS:  COWS Total Score: 2  Musculoskeletal: Strength & Muscle Tone: within normal limits Gait & Station: normal Patient leans: N/A  Psychiatric Specialty Exam: Physical Exam  ROS  Blood pressure 119/90, pulse 86, temperature (!) 97.3 F (36.3 C), temperature source Oral, resp. rate 18, height 5\' 7"  (1.702 m), weight 77.6 kg, last menstrual period 05/16/2018, SpO2 100 %.Body mass index is 26.78 kg/m.  General Appearance: Casual  Eye Contact:  Good  Speech:  Clear and Coherent  Volume:  Normal  Mood:  Dysphoric  Affect:  Constricted and Depressed  Thought Process:  Irrelevant and Descriptions of Associations: Circumstantial  Orientation:  Full (Time, Place, and Person)  Thought Content:  Delusions, Ideas of Reference:   Paranoia and Paranoid Ideation  Suicidal Thoughts:  No  Homicidal Thoughts:  No  Memory:  Immediate;   Fair  Judgement:  Fair  Insight:  Fair  Psychomotor Activity:  Normal  Concentration:  Concentration: Fair  Recall:  [employer] of Knowledge:  Fair  Language:  Fair  Akathisia:  Negative  Handed:  Right  AIMS (if indicated):     Assets:  Resilience  ADL's:  Intact  Cognition:  WNL  Sleep:  Number of Hours: 6.75     Treatment Plan Summary: Daily contact with patient to assess and evaluate symptoms and progress in treatment, Medication  management and Plan Our plans are to decrease the Depakote and Risperdal as described continue reality-based therapy cognitive-based therapy continue to talk to family and probable discharge by the end of the week if improvement seen  [name], MD 05/21/2018, 1:34 PM

## 2018-05-21 NOTE — Progress Notes (Signed)
D: [name] was withdrawn and remote this evening. She denied SI, HI, and AVH but otherwise responded minimally to this writer's attempt to engage her. She was found awake in bed and returned there once receiving her scheduled HS medications. No adverse med effects reported.  A: Meds given as ordered. No PRNs requested or required. Q15 safety checks maintained. Support/encouragement offered.  R: Pt remains free from harm and continues with treatment. Will continue to monitor for needs/safety.

## 2018-05-21 NOTE — Progress Notes (Addendum)
CSW spoke with pt regarding checking on her children.  Pt continues tor report that she has not way of contacting her children and that they communicate by some sort of internet app.  CSW again asked for permission to contact pt brother regarding the safety of her children but pt declines to give permission.   Winferd Humphrey, MSW, LCSW Clinical Social Worker 05/21/2018 10:43 AM   CPS report made to [facility], [place]. Allegations that pt is mentally ill, was committed by brother, and one of the allegations is that she was hearing voices telling her to kill her children.  CSW has been unable to contact children (reportedly the 59 year old son is caring for the 20 and 79 year old minors) to check on them. Winferd Humphrey, MSW, LCSW Clinical Social Worker 05/21/2018 11:14 AM

## 2018-05-21 NOTE — Progress Notes (Signed)
   05/21/18 0819  COVID-19 Daily Checkoff  Have you had a fever (temp > 37.80C/100F)  in the past 24 hours?  No  If you have had runny nose, nasal congestion, sneezing in the past 24 hours, has it worsened? No  COVID-19 EXPOSURE  Have you traveled outside the state in the past 14 days? No  Have you been in contact with someone with a confirmed diagnosis of COVID-19 or PUI in the past 14 days without wearing appropriate PPE? No  Have you been living in the same home as a person with confirmed diagnosis of COVID-19 or a PUI (household contact)? No  Have you been diagnosed with COVID-19? No

## 2018-05-21 NOTE — Progress Notes (Signed)
Recreation Therapy Notes  Date:  5.20.20 Time: 1000 Location: 500 Hall Dayroom  Group Topic: Stress Management  Goal Area(s) Addresses:  Patient will identify positive stress management techniques. Patient will identify benefits of using stress management post d/c.  Behavioral Response: Engaged  Intervention: Stress Management  Activity :  Meditation.  LRT played soft relaxing music in the background.  LRT explained the benefits of meditation and how deep breathing helps slow the heart rate and relaxes the body.  Patients were to follow along as the music played to meditate in a way that was comfortable for them.   Education:  Stress Management, Discharge Planning.   Education Outcome: Acknowledges Education  Clinical Observations/Feedback: Pt was able to sit still during the group session. Pt wasn't constantly wiping her face.  Pt did blow her nose twice.  Pt sat quietly and listened as the music played.    Victorino Sparrow, LRT/CTRS    Ria Comment, Thurman Sarver A 05/21/2018 11:13 AM

## 2018-05-22 NOTE — Progress Notes (Signed)
Recreation Therapy Notes  Date: 5.21.20 Time: 1000 Location: 500 [address] Dayroom  Group Topic: Triggers  Goal Area(s) Addresses:  Patient will identify the problem their triggers are causing. Patient will identify biggest trigger. Patient will identify strategies for dealing with triggers.  Behavioral Response:  Engaged  Intervention:  Worksheet, pencils   Activity:  Triggers.  Patients were to identify problems caused by triggers.  Patients also identified their biggest triggers, how to avoid those triggers and how to deal with triggers head on.  Education: Communication, Discharge Planning  Education Outcome: Acknowledges understanding/In group clarification offered/Needs additional education.   Clinical Observations/Feedback:  Pt identified the problem caused by triggers was manipulating, death syndrome and up/down controlling.  Pt identified biggest triggers as up/down controlling, death syndrome and manipulation.  Pt deal with triggers head on by moaning to God, if in a work setting, complain to boss and "obeying doesn't mean I agree".    Victorino Sparrow, LRT/CTRS    Ria Comment, Toneka Fullen A 05/22/2018 11:00 AM

## 2018-05-22 NOTE — [facility] Group Notes (Signed)
[facility] LCSW Group Therapy Note  Date/Time: 05/22/18, 1315  Type of Therapy/Topic:  Group Therapy:  Feelings about Diagnosis  Participation Level:  Minimal   Mood:pleasant   Description of Group:    This group will allow patients to explore their thoughts and feelings about diagnoses they have received. Patients will be guided to explore their level of understanding and acceptance of these diagnoses. Facilitator will encourage patients to process their thoughts and feelings about the reactions of others to their diagnosis, and will guide patients in identifying ways to discuss their diagnosis with significant others in their lives. This group will be process-oriented, with patients participating in exploration of their own experiences as well as giving and receiving support and challenge from other group members.   Therapeutic Goals: 1. Patient will demonstrate understanding of diagnosis as evidence by identifying two or more symptoms of the disorder:  2. Patient will be able to express two feelings regarding the diagnosis 3. Patient will demonstrate ability to communicate their needs through discussion and/or role plays  Summary of Patient Progress:Pt came to group late, was mostly quiet but did make one comment and was attentive.        Therapeutic Modalities:   Cognitive Behavioral Therapy Brief Therapy Feelings Identification   Lurline Idol, LCSW

## 2018-05-22 NOTE — Progress Notes (Signed)
Nursing Progress Note: 7p-7a D: Pt currently presents with a anxiety/depression/preoccupied/sad/sullen affect and behavior. Pt states "I had a boring day. I don't feel any better." Interacting appropriately with the milieu. Pt reports goood sleep during the previous night with current medication regimen.   A: Pt provided with medications per providers orders. Pt's labs and vitals were monitored throughout the night. Pt supported emotionally and encouraged to express concerns and questions. Pt educated on medications.  R: Pt's safety ensured with 15 minute and environmental checks. Pt currently denies SI, HI, and AVH. Pt verbally contracts to seek staff if SI,HI, or AVH occurs and to consult with staff before acting on any harmful thoughts. Will continue to monitor.

## 2018-05-22 NOTE — [facility] Suicide Risk Assessment (Signed)
[place] INPATIENT:  Family/Significant Other Suicide Prevention Education  Suicide Prevention Education:  Contact Attempts: Jamesetta [place], son, has been identified by the patient as the family member/significant other with whom the patient will be residing, and identified as the person(s) who will aid the patient in the event of a mental health crisis.  With written consent from the patient, two attempts were made to provide suicide prevention education, prior to and/or following the patient's discharge.  We were unsuccessful in providing suicide prevention education.  A suicide education pamphlet was given to the patient to share with family/significant other.  Date and time of first attempt: Date and time of second attempt:  No attempts could be made to contact pt son do to no phone number being available.  Pt was asked several times and encouraged to get CSW phone number but consistently said that her family communicates from "the internet" and did not have a phone.  Pt declined to provide consent for CSW to contact any other family member.  Joanne Chars 05/22/2018, 7:49 AM

## 2018-05-22 NOTE — [facility] Group Notes (Signed)
Occupational Therapy Group Note  Date:  05/22/2018 Time:  3:50 PM  Group Topic/Focus:  Leisure Group  Participation Level:  Active  Participation Quality:  Attentive  Affect:  Flat  Cognitive:  Alert  Insight: Improving  Engagement in Group:  Engaged  Modes of Intervention:  Activity, Discussion, Education and Socialization  Additional Comments:    S: "I like to sing old hymns"  O: OT group focus on leisure this date, while incorporating coping skills to ensure understanding. Pts to play game of Uno: and name a struggle they're facing, a communication skill, something they like about yourself, stress management, and a healthy way to manage anger per certain cards played. Pt also assessed for attention, ability to follow rules, and temperament with rules.   A: Pt presents to group with flat affect, engaged and willing this date. Pt needing cues to appropriately follow rules, often times mistaking number/color throughout game without assistance. Unclear whether this was due to lack of understanding, or having never played game before. 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  P: OT group will be x1 per week while pt inpatient.  Zenovia Jarred, MSOT, OTR/L Behavioral Health OT/ Acute Relief OT PHP Office: [place] 05/22/2018, 3:50 PM

## 2018-05-22 NOTE — Plan of Care (Signed)
Progress note  D: pt found in bed; compliant with medication administration. Pt denies any physical complaints or pain. Pt is pleasant but sullen and sad. Pt forwards little. Pt denies si/hi/ah/vh and verbally agrees to approach staff if these become apparent or before harming herself/others while at [facility]. A: pt provided support and encouragement. Pt given medication per protocol and standing orders. Q25m safety checks implemented and continued. R: pt safe on the unit; will continue to monitor.   Pt progressing in the following metrics  Problem: Education: Goal: Knowledge of [place] [facility] Education information/materials will improve Outcome: Progressing Goal: Verbalization of understanding the information provided will improve Outcome: Progressing   Problem: Coping: Goal: Ability to verbalize frustrations and anger appropriately will improve Outcome: Progressing Goal: Ability to demonstrate self-control will improve Outcome: Progressing

## 2018-05-22 NOTE — Progress Notes (Signed)
[place] NOVEL CORONAVIRUS (COVID-19) DAILY CHECK-OFF SYMPTOMS - answer yes or no to each - every day NO YES  Have you had a fever in the past 24 hours?  . Fever (Temp > 37.80C / 100F) X   Have you had any of these symptoms in the past 24 hours? . New Cough .  Sore Throat  .  Shortness of Breath .  Difficulty Breathing .  Unexplained Body Aches   X   Have you had any one of these symptoms in the past 24 hours not related to allergies?   . Runny Nose .  Nasal Congestion .  Sneezing   X   If you have had runny nose, nasal congestion, sneezing in the past 24 hours, has it worsened?  X   EXPOSURES - check yes or no X   Have you traveled outside the state in the past 14 days?  X   Have you been in contact with someone with a confirmed diagnosis of COVID-19 or PUI in the past 14 days without wearing appropriate PPE?  X   Have you been living in the same home as a person with confirmed diagnosis of COVID-19 or a PUI (household contact)?    X   Have you been diagnosed with COVID-19?    X              What to do next: Answered NO to all: Answered YES to anything:   Proceed with unit schedule Follow the [facility] Inpatient Flowsheet.   

## 2018-05-22 NOTE — Progress Notes (Signed)
[facility] MD Progress Note  05/22/2018 11:09 AM [name]  MRN:  [id number] Subjective:    Patient continues to be quietly delusional in that regard she is still having her paranoia but is not expressing it is much and is minimizing when questioned and is generally behaving her self no disruptive behaviors that is denies positive symptoms.  Continue to encourage compliance continue reality-based therapy Principal Problem: Bipolar affective disorder, mixed, severe, with psychotic behavior ([place]) Diagnosis: Principal Problem:   Bipolar affective disorder, mixed, severe, with psychotic behavior ([place]) Active Problems:   Bipolar affective disorder, current episode manic ([place])   Bipolar affective disorder, current episode manic with psychotic symptoms ([place])  Total Time spent with patient: 20 minutes  Past Medical History:  Past Medical History:  Diagnosis Date  . Anxiety   . Depression   . Hypertension     Past Surgical History:  Procedure Laterality Date  . CESAREAN SECTION  2002, 2009   Family History:  Family History  Problem Relation Age of Onset  . Breast cancer Mother        Deceased, 55  . Hypertension Mother   . Arthritis Father   . Hypertension Father   . Healthy Son   . Asthma Daughter   . Diabetes type II Daughter    Family Psychiatric  History: neg Social History:  Social History   Substance and Sexual Activity  Alcohol Use No     Social History   Substance and Sexual Activity  Drug Use No    Social History   Socioeconomic History  . Marital status: Married    Spouse name: Not on file  . Number of children: Not on file  . Years of education: Not on file  . Highest education level: Not on file  Occupational History  . Not on file  Social Needs  . Financial resource strain: Not on file  . Food insecurity:    Worry: Not on file    Inability: Not on file  . Transportation needs:    Medical: Not on file    Non-medical: Not on file  Tobacco  Use  . Smoking status: Never Smoker  . Smokeless tobacco: Never Used  Substance and Sexual Activity  . Alcohol use: No  . Drug use: No  . Sexual activity: Not on file  Lifestyle  . Physical activity:    Days per week: Not on file    Minutes per session: Not on file  . Stress: Not on file  Relationships  . Social connections:    Talks on phone: Not on file    Gets together: Not on file    Attends religious service: Not on file    Active member of club or organization: Not on file    Attends meetings of clubs or organizations: Not on file    Relationship status: Not on file  Other Topics Concern  . Not on file  Social History Narrative   She lives at home with 2 sons (54, 29) and 1 daughter (70).   She is currently not working.  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  She moved from [place] in 2013.   Additional Social History:    Pain Medications: None reported Prescriptions: None reported Over the Counter: None reported History of alcohol / drug use?: No history of alcohol / drug abuse  Sleep: Fair  Appetite:  Good  Current Medications: Current Facility-Administered Medications  Medication Dose Route Frequency Provider Last Rate Last Dose  . acetaminophen (TYLENOL) tablet 650 mg  650 mg Oral Q6H PRN Money, Lowry Ram, FNP      . alum & mag hydroxide-simeth (MAALOX/MYLANTA) 200-200-20 MG/5ML suspension 30 mL  30 mL Oral Q4H PRN Money, Darnelle Maffucci B, FNP      . benztropine (COGENTIN) tablet 0.5 mg  0.5 mg Oral BID [name], MD   0.5 mg at 05/22/18 0759  . diphenhydrAMINE (BENADRYL) capsule 50 mg  50 mg Oral Q6H PRN Money, Lowry Ram, FNP       Or  . diphenhydrAMINE (BENADRYL) injection 50 mg  50 mg Intramuscular Q6H PRN Money, Darnelle Maffucci B, FNP      . divalproex (DEPAKOTE) DR tablet 500 mg  500 mg Oral QHS [name], MD      . haloperidol (HALDOL) tablet 5 mg  5 mg Oral Q6H PRN Money, Lowry Ram, FNP        Or  . haloperidol lactate (HALDOL) injection 5 mg  5 mg Intramuscular Q6H PRN Money, Lowry Ram, FNP      . hydrOXYzine (ATARAX/VISTARIL) tablet 25 mg  25 mg Oral TID PRN Money, Lowry Ram, FNP      . loratadine (CLARITIN) tablet 10 mg  10 mg Oral Daily [name], MD   10 mg at 05/22/18 0758  . LORazepam (ATIVAN) tablet 2 mg  2 mg Oral Q6H PRN Money, Lowry Ram, FNP       Or  . LORazepam (ATIVAN) injection 2 mg  2 mg Intramuscular Q6H PRN Money, Darnelle Maffucci B, FNP      . magnesium hydroxide (MILK OF MAGNESIA) suspension 30 mL  30 mL Oral Daily PRN Money, Lowry Ram, FNP      . risperiDONE (RISPERDAL) tablet 6 mg  6 mg Oral QHS [name], MD      . temazepam (RESTORIL) capsule 30 mg  30 mg Oral QHS [name], MD   30 mg at 05/20/18 2050  . traZODone (DESYREL) tablet 100 mg  100 mg Oral QHS PRN Corena Pilgrim, MD        Lab Results: No results found for this or any previous visit (from the past 48 hour(s)).  Blood Alcohol level:  Lab Results  Component Value Date   ETH <10 12/27/2016   ETH  03/22/2007    <5        LOWEST DETECTABLE LIMIT FOR SERUM ALCOHOL IS 11 mg/dL FOR MEDICAL PURPOSES ONLY    Metabolic Disorder Labs: Lab Results  Component Value Date   HGBA1C 5.1 12/29/2016   MPG 99.67 12/29/2016   No results found for: PROLACTIN Lab Results  Component Value Date   CHOL 147 12/29/2016   TRIG 43 12/29/2016   HDL 54 12/29/2016   CHOLHDL 2.7 12/29/2016   VLDL 9 12/29/2016   LDLCALC 84 12/29/2016    Physical Findings: AIMS: Facial and Oral Movements Muscles of Facial Expression: None, normal Lips and Perioral Area: None, normal Jaw: None, normal Tongue: None, normal,Extremity Movements Upper (arms, wrists, hands, fingers): None, normal Lower (legs, knees, ankles, toes): None, normal, Trunk Movements Neck, shoulders, hips: None, normal, Overall Severity Severity of abnormal movements (highest score from questions above): None, normal Incapacitation due to abnormal  movements: None, normal Patient's awareness of abnormal movements (rate only patient's report): No Awareness, Dental Status Current problems with teeth and/or dentures?: No Does patient usually wear  dentures?: No  CIWA:  CIWA-Ar Total: 0 COWS:  COWS Total Score: 2  Musculoskeletal: Strength & Muscle Tone: within normal limits Gait & Station: normal Patient leans: N/A  Psychiatric Specialty Exam: Physical Exam  ROS  Blood pressure (!) 137/103, pulse 82, temperature 98 F (36.7 C), temperature source Oral, resp. rate 18, height 5\' 7"  (1.702 m), weight 77.6 kg, last menstrual period 05/16/2018, SpO2 100 %.Body mass index is 26.78 kg/m.  General Appearance: Casual  Eye Contact:  Good  Speech:  Clear and Coherent  Volume:  Decreased  Mood:  Dysphoric  Affect:  Congruent and Restricted  Thought Process:  Goal Directed and Descriptions of Associations: Circumstantial  Orientation:  Full (Time, Place, and Person)  Thought Content:  Illogical, Delusions, Paranoid Ideation and Tangential  Suicidal Thoughts:  No  Homicidal Thoughts:  No  Memory:  Immediate;   Fair  Judgement:  Impaired  Insight:  Shallow  Psychomotor Activity:  Normal  Concentration:  Concentration: Fair  Recall:  [place] of Knowledge:  Poor  Language:  Good  Akathisia:  Negative  Handed:  Right  AIMS (if indicated):     Assets:  Talents/Skills Transportation  ADL's:  Intact  Cognition:  WNL  Sleep:  Number of Hours: 6.25     Treatment Plan Summary: Daily contact with patient to assess and evaluate symptoms and progress in treatment, Medication management and Plan Continue cognitive and reality-based therapy continue current med regimen probable discharge in 24 to 48 hours if continues to improve  Latravis Grine, MD 05/22/2018, 11:09 AM

## 2018-05-23 MED ORDER — PALIPERIDONE PALMITATE ER 234 MG/1.5ML IM SUSY
234.0000 mg | PREFILLED_SYRINGE | Freq: Once | INTRAMUSCULAR | Status: AC
  Administered 2018-05-23: 234 mg via INTRAMUSCULAR
  Filled 2018-05-23: qty 1.5

## 2018-05-23 NOTE — Progress Notes (Signed)
[facility] MD Progress Note  05/23/2018 10:34 AM [name]  MRN:  [id number] Subjective:    Patient seems to be having resolution in her delusion she minimized them yesterday and today states he simply not worried about the conspiracies in the paranoid that was previously expressed, I cannot get a hold of her brother who was our main contact that I spoke with before but he does not pick up today and his mailbox is full but I wanted to gauge his understanding of how she is doing and explore long-acting injectable as well I think the patient needs long-acting injectable due to her history of volatility when sick so we can go ahead and order the invega she has been on invega previously Risperdal here as well as a test dose of aripiprazole but since she is responding to the Risperdal I think is best to go with the paliperidone long-acting  Principal Problem: Bipolar affective disorder, mixed, severe, with psychotic behavior ([place]) Diagnosis: Principal Problem:   Bipolar affective disorder, mixed, severe, with psychotic behavior ([place]) Active Problems:   Bipolar affective disorder, current episode manic ([place])   Bipolar affective disorder, current episode manic with psychotic symptoms ([place])  Total Time spent with patient: 20 minutes   Past Medical History:  Past Medical History:  Diagnosis Date  . Anxiety   . Depression   . Hypertension     Past Surgical History:  Procedure Laterality Date  . CESAREAN SECTION  2002, 2009   Family History:  Family History  Problem Relation Age of Onset  . Breast cancer Mother        Deceased, 2  . Hypertension Mother   . Arthritis Father   . Hypertension Father   . Healthy Son   . Asthma Daughter   . Diabetes type II Daughter    Family Psychiatric  History: neg Social History:  Social History   Substance and Sexual Activity  Alcohol Use No     Social History   Substance and Sexual Activity  Drug Use No    Social History    Socioeconomic History  . Marital status: Married    Spouse name: Not on file  . Number of children: Not on file  . Years of education: Not on file  . Highest education level: Not on file  Occupational History  . Not on file  Social Needs  . Financial resource strain: Not on file  . Food insecurity:    Worry: Not on file    Inability: Not on file  . Transportation needs:    Medical: Not on file    Non-medical: Not on file  Tobacco Use  . Smoking status: Never Smoker  . Smokeless tobacco: Never Used  Substance and Sexual Activity  . Alcohol use: No  . Drug use: No  . Sexual activity: Not on file  Lifestyle  . Physical activity:    Days per week: Not on file    Minutes per session: Not on file  . Stress: Not on file  Relationships  . Social connections:    Talks on phone: Not on file    Gets together: Not on file    Attends religious service: Not on file    Active member of club or organization: Not on file    Attends meetings of clubs or organizations: Not on file    Relationship status: Not on file  Other Topics Concern  . Not on file  Social History Narrative   She lives at  home with 2 sons (6, 39) and 1 daughter (53).   She is currently not working.  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  She moved from [place] in 2013.   Additional Social History:    Pain Medications: None reported Prescriptions: None reported Over the Counter: None reported History of alcohol / drug use?: No history of alcohol / drug abuse                    Sleep: Fair  Appetite:  Fair  Current Medications: Current Facility-Administered Medications  Medication Dose Route Frequency Provider Last Rate Last Dose  . acetaminophen (TYLENOL) tablet 650 mg  650 mg Oral Q6H PRN Money, Lowry Ram, FNP      . alum & mag hydroxide-simeth (MAALOX/MYLANTA) 200-200-20 MG/5ML suspension 30 mL  30 mL Oral Q4H PRN Money, Darnelle Maffucci B, FNP      .  benztropine (COGENTIN) tablet 0.5 mg  0.5 mg Oral BID [name], MD   0.5 mg at 05/23/18 9735  . diphenhydrAMINE (BENADRYL) capsule 50 mg  50 mg Oral Q6H PRN Money, Lowry Ram, FNP       Or  . diphenhydrAMINE (BENADRYL) injection 50 mg  50 mg Intramuscular Q6H PRN Money, Darnelle Maffucci B, FNP      . divalproex (DEPAKOTE) DR tablet 500 mg  500 mg Oral QHS [name], MD   500 mg at 05/22/18 2056  . haloperidol (HALDOL) tablet 5 mg  5 mg Oral Q6H PRN Money, Lowry Ram, FNP       Or  . haloperidol lactate (HALDOL) injection 5 mg  5 mg Intramuscular Q6H PRN Money, Lowry Ram, FNP      . hydrOXYzine (ATARAX/VISTARIL) tablet 25 mg  25 mg Oral TID PRN Money, Lowry Ram, FNP      . loratadine (CLARITIN) tablet 10 mg  10 mg Oral Daily [name], MD   10 mg at 05/23/18 0814  . LORazepam (ATIVAN) tablet 2 mg  2 mg Oral Q6H PRN Money, Lowry Ram, FNP       Or  . LORazepam (ATIVAN) injection 2 mg  2 mg Intramuscular Q6H PRN Money, Darnelle Maffucci B, FNP      . magnesium hydroxide (MILK OF MAGNESIA) suspension 30 mL  30 mL Oral Daily PRN Money, Darnelle Maffucci B, FNP      . paliperidone (INVEGA SUSTENNA) injection 234 mg  234 mg Intramuscular Once [name], MD      . risperiDONE (RISPERDAL) tablet 6 mg  6 mg Oral QHS [name], MD   6 mg at 05/22/18 2056  . temazepam (RESTORIL) capsule 30 mg  30 mg Oral QHS [name], MD   30 mg at 05/22/18 2056  . traZODone (DESYREL) tablet 100 mg  100 mg Oral QHS PRN Corena Pilgrim, MD        Lab Results: No results found for this or any previous visit (from the past 48 hour(s)).  Blood Alcohol level:  Lab Results  Component Value Date   ETH <10 12/27/2016   ETH  03/22/2007    <5        LOWEST DETECTABLE LIMIT FOR SERUM ALCOHOL IS 11 mg/dL FOR MEDICAL PURPOSES ONLY    Metabolic Disorder Labs: Lab Results  Component Value Date   HGBA1C 5.1 12/29/2016   MPG 99.67 12/29/2016   No results found for: PROLACTIN Lab Results  Component Value Date   CHOL 147 12/29/2016  TRIG  43 12/29/2016   HDL 54 12/29/2016   CHOLHDL 2.7 12/29/2016   VLDL 9 12/29/2016   LDLCALC 84 12/29/2016    Physical Findings: AIMS: Facial and Oral Movements Muscles of Facial Expression: None, normal Lips and Perioral Area: None, normal Jaw: None, normal Tongue: None, normal,Extremity Movements Upper (arms, wrists, hands, fingers): None, normal Lower (legs, knees, ankles, toes): None, normal, Trunk Movements Neck, shoulders, hips: None, normal, Overall Severity Severity of abnormal movements (highest score from questions above): None, normal Incapacitation due to abnormal movements: None, normal Patient's awareness of abnormal movements (rate only patient's report): No Awareness, Dental Status Current problems with teeth and/or dentures?: No Does patient usually wear dentures?: No  CIWA:  CIWA-Ar Total: 0 COWS:  COWS Total Score: 2  Musculoskeletal: Strength & Muscle Tone: within normal limits Gait & Station: normal Patient leans: N/A  Psychiatric Specialty Exam: Physical Exam  ROS  Blood pressure 115/61, pulse 100, temperature 98 F (36.7 C), temperature source Oral, resp. rate 16, height 5\' 7"  (1.702 m), weight 77.6 kg, last menstrual period 05/16/2018, SpO2 100 %.Body mass index is 26.78 kg/m.  General Appearance: Fairly Groomed  Eye Contact:  Good  Speech:  Clear and Coherent  Volume:  Decreased  Mood:  Euthymic  Affect:  Congruent  Thought Process:  Goal Directed and Descriptions of Associations: Circumstantial  Orientation:  Full (Time, Place, and Person)  Thought Content:  Delusions and Tangential  Suicidal Thoughts:  No  Homicidal Thoughts:  No  Memory:  Immediate;   Fair  Judgement:  Fair  Insight:  Fair  Psychomotor Activity:  Normal  Concentration:  Concentration: Fair  Recall:  [employer] of Knowledge:  Fair  Language:  Fair  Akathisia:  Negative  Handed:  Right  AIMS (if indicated):     Assets:  [place]ilience Social Support Talents/Skills  ADL's:   Intact  Cognition:  WNL  Sleep:  Number of Hours: 6.75     Treatment Plan Summary: Daily contact with patient to assess and evaluate symptoms and progress in treatment, Medication management and Plan In summary I think she is gotten less delusional and more concrete in her thinking and this is a positive sign her going to go and administer the first dose of the paliperidone long-acting injectable in anticipation of discharge Monday with the second dose being then.  [name], MD 05/23/2018, 10:34 AM

## 2018-05-23 NOTE — Progress Notes (Signed)
   05/23/18 2226  COVID-19 Daily Checkoff  Have you had a fever (temp > 37.80C/100F)  in the past 24 hours?  No  COVID-19 EXPOSURE  Have you traveled outside the state in the past 14 days? No  Have you been in contact with someone with a confirmed diagnosis of COVID-19 or PUI in the past 14 days without wearing appropriate PPE? No  Have you been living in the same home as a person with confirmed diagnosis of COVID-19 or a PUI (household contact)? No  Have you been diagnosed with COVID-19? No

## 2018-05-23 NOTE — Plan of Care (Signed)
Progress note  D: pt found in bed; compliant with medication administration. Pt denies any physical pain, but does complain of sleepiness. Pt has been viewed in the milieu in groups. Pt keeps to herself. Pt denies si/hi/ah/vh and verbally agrees to approach staff if these become apparent or before harming herself/others while at [place]. A: pt provided support and encouragement. Pt given medication per protocol and standing orders. Q53m safety checks implemented and continued.  R: pt safe on the unit. Will continue to monitor.   Pt progressing in the following metrics  Problem: Health Behavior/Discharge Planning: Goal: Identification of resources available to assist in meeting health care needs will improve Outcome: Progressing Goal: Compliance with treatment plan for underlying cause of condition will improve Outcome: Progressing   Problem: Physical Regulation: Goal: Ability to maintain clinical measurements within normal limits will improve Outcome: Progressing   Problem: Safety: Goal: Periods of time without injury will increase Outcome: Progressing

## 2018-05-23 NOTE — Progress Notes (Signed)
D: Pt was in dayroom watching TV upon initial approach.  Pt presents with depressed affect and mood.  She reports her day was "good" and her goal is to "just make sure I participate."  Pt denies SI/HI, denies hallucinations, denies pain.  Pt has been visible in milieu with minimal interaction with peers.  She interacts appropriately with staff.    A: Introduced self to pt.  Met with pt 1:1.  Actively listened to pt and offered support and encouragement.  Medications administered per order.  Q15 minute safety checks in place.  R: Pt is safe on the unit.  Pt is compliant with medications.  Pt verbally contracts for safety.  Will continue to monitor and assess.

## 2018-05-23 NOTE — Progress Notes (Signed)
The focus of this group is to help patients establish daily goals to achieve during treatment and discuss how the patient can incorporate goal setting into their daily lives to aide in recovery. 

## 2018-05-23 NOTE — Tx Team (Signed)
Interdisciplinary Treatment and Diagnostic Plan Update  05/23/2018 Time of Session: [name] MRN: [id number]  Principal Diagnosis: Bipolar affective disorder, mixed, severe, with psychotic behavior ([place])  Secondary Diagnoses: Principal Problem:   Bipolar affective disorder, mixed, severe, with psychotic behavior ([name]) Active Problems:   Bipolar affective disorder, current episode manic ([name])   Bipolar affective disorder, current episode manic with psychotic symptoms ([name])   Current Medications:  Current Facility-Administered Medications  Medication Dose Route Frequency Provider Last Rate Last Dose  . acetaminophen (TYLENOL) tablet 650 mg  650 mg Oral Q6H PRN Money, Lowry Ram, FNP      . alum & mag hydroxide-simeth (MAALOX/MYLANTA) 200-200-20 MG/5ML suspension 30 mL  30 mL Oral Q4H PRN Money, Darnelle Maffucci B, FNP      . benztropine (COGENTIN) tablet 0.5 mg  0.5 mg Oral BID [name], MD   0.5 mg at 05/23/18 3976  . diphenhydrAMINE (BENADRYL) capsule 50 mg  50 mg Oral Q6H PRN Money, Lowry Ram, FNP       Or  . diphenhydrAMINE (BENADRYL) injection 50 mg  50 mg Intramuscular Q6H PRN Money, Darnelle Maffucci B, FNP      . divalproex (DEPAKOTE) DR tablet 500 mg  500 mg Oral QHS [name], MD   500 mg at 05/22/18 2056  . haloperidol (HALDOL) tablet 5 mg  5 mg Oral Q6H PRN Money, Lowry Ram, FNP       Or  . haloperidol lactate (HALDOL) injection 5 mg  5 mg Intramuscular Q6H PRN Money, Lowry Ram, FNP      . hydrOXYzine (ATARAX/VISTARIL) tablet 25 mg  25 mg Oral TID PRN Money, Lowry Ram, FNP      . loratadine (CLARITIN) tablet 10 mg  10 mg Oral Daily [name], MD   10 mg at 05/23/18 0814  . LORazepam (ATIVAN) tablet 2 mg  2 mg Oral Q6H PRN Money, Lowry Ram, FNP       Or  . LORazepam (ATIVAN) injection 2 mg  2 mg Intramuscular Q6H PRN Money, Darnelle Maffucci B, FNP      . magnesium hydroxide (MILK OF MAGNESIA) suspension 30 mL  30 mL Oral Daily PRN Money, Lowry Ram, FNP      . risperiDONE (RISPERDAL)  tablet 6 mg  6 mg Oral QHS [name], MD   6 mg at 05/22/18 2056  . temazepam (RESTORIL) capsule 30 mg  30 mg Oral QHS [name], MD   30 mg at 05/22/18 2056  . traZODone (DESYREL) tablet 100 mg  100 mg Oral QHS PRN Akintayo, [name], MD       PTA Medications: Medications Prior to Admission  Medication Sig Dispense Refill Last Dose  . divalproex (DEPAKOTE) 250 MG DR tablet Take 1 tablet (250 mg total) by mouth every 12 (twelve) hours. (Patient not taking: Reported on 05/17/2018) 60 tablet 1 Not Taking at Unknown time  . docusate sodium (COLACE) 100 MG capsule Take 1 capsule (100 mg total) by mouth daily with breakfast. (Patient not taking: Reported on 05/17/2018) 30 capsule 1 Not Taking at Unknown time  . ferrous sulfate 325 (65 FE) MG tablet Take 1 tablet (325 mg total) by mouth daily with breakfast. (Patient not taking: Reported on 05/17/2018) 30 tablet 3 Not Taking at Unknown time  . haloperidol decanoate (HALDOL DECANOATE) 100 MG/ML injection Inject 1 mL (100 mg total) into the muscle every 28 (twenty-eight) days. (Patient not taking: Reported on 05/17/2018) 1 mL 1 Not Taking at Unknown time  . OLANZapine (  ZYPREXA) 15 MG tablet Take 2 tablets (30 mg total) by mouth at bedtime. (Patient not taking: Reported on 05/17/2018) 60 tablet 1 Not Taking at Unknown time  . traZODone (DESYREL) 100 MG tablet Take 1 tablet (100 mg total) by mouth at bedtime as needed for sleep. (Patient not taking: Reported on 05/17/2018) 30 tablet 1 Not Taking at Unknown time    Patient Stressors:    Patient Strengths: Ability for insight Supportive family/friends  Treatment Modalities: Medication Management, Group therapy, Case management,  1 to 1 session with clinician, Psychoeducation, Recreational therapy.   Physician Treatment Plan for Primary Diagnosis: Bipolar affective disorder, mixed, severe, with psychotic behavior ([name]) Long Term Goal(s): Improvement in symptoms so as ready for discharge Improvement in  symptoms so as ready for discharge   Short Term Goals: Ability to demonstrate self-control will improve Ability to identify and develop effective coping behaviors will improve  Medication Management: Evaluate patient's response, side effects, and tolerance of medication regimen.  Therapeutic Interventions: 1 to 1 sessions, Unit Group sessions and Medication administration.  Evaluation of Outcomes: Progressing  Physician Treatment Plan for Secondary Diagnosis: Principal Problem:   Bipolar affective disorder, mixed, severe, with psychotic behavior ([name]) Active Problems:   Bipolar affective disorder, current episode manic ([name])   Bipolar affective disorder, current episode manic with psychotic symptoms ([name])  Long Term Goal(s): Improvement in symptoms so as ready for discharge Improvement in symptoms so as ready for discharge   Short Term Goals: Ability to demonstrate self-control will improve Ability to identify and develop effective coping behaviors will improve     Medication Management: Evaluate patient's response, side effects, and tolerance of medication regimen.  Therapeutic Interventions: 1 to 1 sessions, Unit Group sessions and Medication administration.  Evaluation of Outcomes: Progressing   RN Treatment Plan for Primary Diagnosis: Bipolar affective disorder, mixed, severe, with psychotic behavior ([name]) Long Term Goal(s): Knowledge of disease and therapeutic regimen to maintain health will improve  Short Term Goals: Ability to identify and develop effective coping behaviors will improve and Compliance with prescribed medications will improve  Medication Management: RN will administer medications as ordered by provider, will assess and evaluate patient's response and provide education to patient for prescribed medication. RN will report any adverse and/or side effects to prescribing provider.  Therapeutic Interventions: 1 on 1 counseling sessions, Psychoeducation,  Medication administration, Evaluate responses to treatment, Monitor vital signs and CBGs as ordered, Perform/monitor CIWA, COWS, AIMS and Fall Risk screenings as ordered, Perform wound care treatments as ordered.  Evaluation of Outcomes: Not Met   LCSW Treatment Plan for Primary Diagnosis: Bipolar affective disorder, mixed, severe, with psychotic behavior ([place]) Long Term Goal(s): Safe transition to appropriate next level of care at discharge, Engage patient in therapeutic group addressing interpersonal concerns.  Short Term Goals: Engage patient in aftercare planning with referrals and resources, Increase social support and Increase skills for wellness and recovery  Therapeutic Interventions: Assess for all discharge needs, 1 to 1 time with Social worker, Explore available resources and support systems, Assess for adequacy in community support network, Educate family and significant other(s) on suicide prevention, Complete Psychosocial Assessment, Interpersonal group therapy.  Evaluation of Outcomes: Progressing   Progress in Treatment: Attending groups: Yes. Participating in groups: Yes. Taking medication as prescribed: Yes. Toleration medication: Yes. Family/Significant other contact made: No, will contact:  son- patient states he does not have a phone Patient understands diagnosis: No. Discussing patient identified problems/goals with staff: Yes. Medical problems stabilized or resolved: Yes. Denies  suicidal/homicidal ideation: Yes. Issues/concerns per patient self-inventory: No. Other: none  New problem(s) identified: No, Describe:  none  New Short Term/Long Term Goal(s): medication management for mood stabilization; elimination of SI thoughts; development of comprehensive mental wellness/sobriety plan.  Patient Goals:  "help in dealing with the crisis"  Discharge Plan or Barriers: Expected to discharge home and [facility] for [facility] care.  Reason for  Continuation of Hospitalization: Delusions  Hallucinations Medication stabilization  Estimated Length of Stay:1-3 days  Attendees: Patient: [name] 05/23/2018   Physician: Dr. Jake Samples, MD 05/23/2018   Nursing: Sena Hitch, RN 05/23/2018   RN Care Manager: 05/23/2018   Social Worker: Lurline Idol, [place], [place] 05/23/2018   Recreational Therapist:  05/23/2018   Other:  05/23/2018   Other:  05/23/2018   Other: 05/23/2018     Scribe for Treatment Team: Joellen Jersey, [place] 05/23/2018 8:32 AM
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
## 2018-05-23 NOTE — Progress Notes (Signed)
Pt attended spirituality group facilitated by Simone Curia, MDIv, [place].  Group Description:  Group focused on topic of hope.  Patients participated in facilitated discussion around topic, connecting with one another around experiences and definitions for hope.  Group members engaged with visual explorer photos, reflecting on what hope looks like for them today.  Group engaged in discussion around how their definitions of hope are present today in hospital.   Modalities: Psycho-social ed, Adlerian, Narrative, MI  Present through group. Engaged in group discussion voluntarily.  Speaks about finding hope in orienting to moments of peace.  She states that nature helps her with this and notes going to the [place]fall in downtown [place] to "breathe better"

## 2018-05-23 NOTE — Progress Notes (Addendum)
Recreation Therapy Notes  Date: 5.22.20 Time: 1015 Location: 500 Hall Dayroom  Group Topic: Self-Esteem  Goal Area(s) Addresses:  Patient will successfully identify positive attributes about themselves.  Patient will successfully identify benefit of improved self-esteem.   Behavioral Response: Engaged  Intervention: [employer], paper with Marketing executive frame  Activity: Geologist, engineering, Geologist, engineering.  Patients were to fill the inside of the  blank picture frame with images that describe them.  Patients could use words or draw pictures that describe them at the present moment.  Education:  Self-Esteem, Dentist.   Education Outcome: Acknowledges education/In group clarification offered/Needs additional education  Clinical Observations/Feedback:  Patient drew clouds because she stated she liked to walk along the bridge, sit and watch the clouds.  Pt also drew a building that represented goddess, shelter/tower.  Pt also drew grass with flowers growing in between.      Victorino Sparrow, LRT/CTRS     Ria Comment, Mara Favero A 05/23/2018 11:04 AM

## 2018-05-24 MED ORDER — TEMAZEPAM 15 MG PO CAPS
15.0000 mg | ORAL_CAPSULE | Freq: Every day | ORAL | Status: DC
  Administered 2018-05-24 – 2018-05-25 (×2): 15 mg via ORAL
  Filled 2018-05-24 (×2): qty 1

## 2018-05-24 NOTE — Progress Notes (Signed)
[place] NOVEL CORONAVIRUS (COVID-19) DAILY CHECK-OFF SYMPTOMS - answer yes or no to each - every day NO YES  Have you had a fever in the past 24 hours?  . Fever (Temp > 37.80C / 100F) X   Have you had any of these symptoms in the past 24 hours? . New Cough .  Sore Throat  .  Shortness of Breath .  Difficulty Breathing .  Unexplained Body Aches   X   Have you had any one of these symptoms in the past 24 hours not related to allergies?   . Runny Nose .  Nasal Congestion .  Sneezing   X   If you have had runny nose, nasal congestion, sneezing in the past 24 hours, has it worsened?  X   EXPOSURES - check yes or no X   Have you traveled outside the state in the past 14 days?  X   Have you been in contact with someone with a confirmed diagnosis of COVID-19 or PUI in the past 14 days without wearing appropriate PPE?  X   Have you been living in the same home as a person with confirmed diagnosis of COVID-19 or a PUI (household contact)?    X   Have you been diagnosed with COVID-19?    X              What to do next: Answered NO to all: Answered YES to anything:   Proceed with unit schedule Follow the [facility] Inpatient Flowsheet.   

## 2018-05-24 NOTE — Progress Notes (Signed)
D. Pt pleasant on approach, no complaints voiced at this time.  Pt was positive for evening wrap up group, minimally engaged on unit.  Pt denies SI/HI/AVH at this time.  A.  Support and encouragement offered, medication given as ordered  R. Pt remains safe on the unit, will continue to monitor.

## 2018-05-24 NOTE — Progress Notes (Signed)
Nursing Progress Note: 7-7p  D- Mood is depressed, pt isolates in her room. Pt denies a/v hall ," I do feel like something is forcing me to stay in the bed but you can't see it" Pt was smiling after she said it. appropriate. Pt is able to contract for safety..   A -Support and encouragement offered, safety maintained with q 15 minutes.   R-Contracts for safety and continues to follow treatment plan,Pt is compliant with medications.
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
## 2018-05-25 NOTE — [facility] Group Notes (Signed)
[facility] LCSW Group Therapy Note  Date/Time:  05/25/2018    Type of Therapy and Topic:  Group Therapy:  Music and Mood  Participation Level:  Active   Description of Group: In this process group, members listened to a variety of genres of music and identified that different types of music evoke different responses.  Patients were encouraged to identify music that was soothing for them and music that was energizing for them.  Patients discussed how this knowledge can help with wellness and recovery in various ways including managing depression and anxiety as well as encouraging healthy sleep habits.    Therapeutic Goals: 1. Patients will explore the impact of different varieties of music on mood 2. Patients will verbalize the thoughts they have when listening to different types of music 3. Patients will identify music that is soothing to them as well as music that is energizing to them 4. Patients will discuss how to use this knowledge to assist in maintaining wellness and recovery 5. Patients will explore the use of music as a coping skill  Summary of Patient Progress:  At the beginning of group, patient expressed that she felt good.  At the end of group she stated she felt better and more relaxed.  She said some of the songs made her feel very emotional.  Therapeutic Modalities: Solution Focused Brief Therapy Activity   Selmer Dominion, LCSW

## 2018-05-25 NOTE — Progress Notes (Signed)
Pt presents with a flat affect and a depressed mood. Pt denies SI/HI. Pt denies AVH. Pt reported fair sleep at bedtime. Pt denies depression, anxiety and hopelessness. Pt requested to speak with the MD regarding an update on her treatment. Writer informed the pt that she will meet with the MD or NP daily to discuss her treatment.   Medications reviewed with pt. Verbal support provided. Pt encouraged to attend groups. 15 minute checks performed for safety.  Pt compliant with tx plan.  [place] NOVEL CORONAVIRUS (COVID-19) DAILY CHECK-OFF SYMPTOMS - answer yes or no to each - every day NO YES  Have you had a fever in the past 24 hours?  . Fever (Temp > 37.80C / 100F) X   Have you had any of these symptoms in the past 24 hours? . New Cough .  Sore Throat  .  Shortness of Breath .  Difficulty Breathing .  Unexplained Body Aches   X   Have you had any one of these symptoms in the past 24 hours not related to allergies?   . Runny Nose .  Nasal Congestion .  Sneezing   X   If you have had runny nose, nasal congestion, sneezing in the past 24 hours, has it worsened?  X   EXPOSURES - check yes or no X   Have you traveled outside the state in the past 14 days?  X   Have you been in contact with someone with a confirmed diagnosis of COVID-19 or PUI in the past 14 days without wearing appropriate PPE?  X   Have you been living in the same home as a person with confirmed diagnosis of COVID-19 or a PUI (household contact)?    X   Have you been diagnosed with COVID-19?    X              What to do next: Answered NO to all: Answered YES to anything:   Proceed with unit schedule Follow the [facility] Inpatient Flowsheet.

## 2018-05-25 NOTE — Progress Notes (Signed)
[facility] MD Progress Note  05/25/2018 9:12 AM Marney Treloar  MRN:  [id number] Subjective:  Patient is a 48 year old female admitted on 05/16/2018 under involuntary commitment.  The patient has a history of bipolar disorder.  She had been placed under involuntary commitment by her brother secondary to suicidal ideation as well as auditory hallucinations  Objective: Patient is seen and examined.  Patient is a 48 year old female with the above-stated past psychiatric history who is seen in follow-up.  The only change in her medications yesterday was to reduce her temazepam given her oversedation.  The review of the electronic medical record revealed that nurses reported 6 hours of sleep last night.  She denied any auditory or visual hallucinations.  She denied any suicidal ideation.  She did not complain of oversedation today.  She does not like the fact that she is taking multiple pills given she is given a long-acting injection.  I explained to her that it took time for that injection to kick in, and that the medications would overlap.  Her blood pressure this morning is mildly elevated at 142/97, pulse is 90, she is afebrile.  Her Depakote and other laboratories are pending to be drawn tomorrow morning.   Principal Problem: Bipolar affective disorder, mixed, severe, with psychotic behavior ([place]) Diagnosis: Principal Problem:   Bipolar affective disorder, mixed, severe, with psychotic behavior ([place]) Active Problems:   Bipolar affective disorder, current episode manic ([place])   Bipolar affective disorder, current episode manic with psychotic symptoms ([place])  Total Time spent with patient: 15 minutes  Past Psychiatric History: See admission H&P  Past Medical History:  Past Medical History:  Diagnosis Date  . Anxiety   . Depression   . Hypertension     Past Surgical History:  Procedure Laterality Date  . CESAREAN SECTION  2002, 2009   Family History:  Family History  Problem Relation Age  of Onset  . Breast cancer Mother        Deceased, 40  . Hypertension Mother   . Arthritis Father   . Hypertension Father   . Healthy Son   . Asthma Daughter   . Diabetes type II Daughter    Family Psychiatric  History: See admission H&P Social History:  Social History   Substance and Sexual Activity  Alcohol Use No     Social History   Substance and Sexual Activity  Drug Use No    Social History   Socioeconomic History  . Marital status: Married    Spouse name: Not on file  . Number of children: Not on file  . Years of education: Not on file  . Highest education level: Not on file  Occupational History  . Not on file  Social Needs  . Financial resource strain: Not on file  . Food insecurity:    Worry: Not on file    Inability: Not on file  . Transportation needs:    Medical: Not on file    Non-medical: Not on file  Tobacco Use  . Smoking status: Never Smoker  . Smokeless tobacco: Never Used  Substance and Sexual Activity  . Alcohol use: No  . Drug use: No  . Sexual activity: Not on file  Lifestyle  . Physical activity:    Days per week: Not on file    Minutes per session: Not on file  . Stress: Not on file  Relationships  . Social connections:    Talks on phone: Not on file    Gets together:  Not on file    Attends religious service: Not on file    Active member of club or organization: Not on file    Attends meetings of clubs or organizations: Not on file    Relationship status: Not on file  Other Topics Concern  . Not on file  Social History Narrative   She lives at home with 2 sons (29, 95) and 1 daughter (58).   She is currently not working.  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  She moved from [place] in 2013.   Additional Social History:    Pain Medications: None reported Prescriptions: None reported Over the Counter: None reported History of alcohol / drug use?: No history of alcohol / drug  abuse                    Sleep: Good  Appetite:  Fair  Current Medications: Current Facility-Administered Medications  Medication Dose Route Frequency Provider Last Rate Last Dose  . acetaminophen (TYLENOL) tablet 650 mg  650 mg Oral Q6H PRN Money, Lowry Ram, FNP      . alum & mag hydroxide-simeth (MAALOX/MYLANTA) 200-200-20 MG/5ML suspension 30 mL  30 mL Oral Q4H PRN Money, Darnelle Maffucci B, FNP      . benztropine (COGENTIN) tablet 0.5 mg  0.5 mg Oral BID [name], MD   0.5 mg at 05/25/18 0752  . diphenhydrAMINE (BENADRYL) capsule 50 mg  50 mg Oral Q6H PRN Money, Lowry Ram, FNP       Or  . diphenhydrAMINE (BENADRYL) injection 50 mg  50 mg Intramuscular Q6H PRN Money, Darnelle Maffucci B, FNP      . divalproex (DEPAKOTE) DR tablet 500 mg  500 mg Oral QHS [name], MD   500 mg at 05/24/18 2115  . haloperidol (HALDOL) tablet 5 mg  5 mg Oral Q6H PRN Money, Lowry Ram, FNP       Or  . haloperidol lactate (HALDOL) injection 5 mg  5 mg Intramuscular Q6H PRN Money, Lowry Ram, FNP      . hydrOXYzine (ATARAX/VISTARIL) tablet 25 mg  25 mg Oral TID PRN Money, Lowry Ram, FNP   25 mg at 05/24/18 1420  . loratadine (CLARITIN) tablet 10 mg  10 mg Oral Daily [name], MD   10 mg at 05/25/18 0751  . LORazepam (ATIVAN) tablet 2 mg  2 mg Oral Q6H PRN Money, Lowry Ram, FNP       Or  . LORazepam (ATIVAN) injection 2 mg  2 mg Intramuscular Q6H PRN Money, Darnelle Maffucci B, FNP      . magnesium hydroxide (MILK OF MAGNESIA) suspension 30 mL  30 mL Oral Daily PRN Money, Lowry Ram, FNP      . risperiDONE (RISPERDAL) tablet 6 mg  6 mg Oral QHS [name], MD   6 mg at 05/24/18 2115  . temazepam (RESTORIL) capsule 15 mg  15 mg Oral QHS Sharma Covert, MD   15 mg at 05/24/18 2115  . traZODone (DESYREL) tablet 100 mg  100 mg Oral QHS PRN Corena Pilgrim, MD        Lab Results: No results found for this or any previous visit (from the past 48 hour(s)).  Blood Alcohol level:  Lab Results  Component Value Date   ETH <10  12/27/2016   ETH  03/22/2007    <5        LOWEST DETECTABLE LIMIT FOR SERUM ALCOHOL IS 11  mg/dL FOR MEDICAL PURPOSES ONLY    Metabolic Disorder Labs: Lab Results  Component Value Date   HGBA1C 5.1 12/29/2016   MPG 99.67 12/29/2016   No results found for: PROLACTIN Lab Results  Component Value Date   CHOL 147 12/29/2016   TRIG 43 12/29/2016   HDL 54 12/29/2016   CHOLHDL 2.7 12/29/2016   VLDL 9 12/29/2016   LDLCALC 84 12/29/2016    Physical Findings: AIMS: Facial and Oral Movements Muscles of Facial Expression: None, normal Lips and Perioral Area: None, normal Jaw: None, normal Tongue: None, normal,Extremity Movements Upper (arms, wrists, hands, fingers): None, normal Lower (legs, knees, ankles, toes): None, normal, Trunk Movements Neck, shoulders, hips: None, normal, Overall Severity Severity of abnormal movements (highest score from questions above): None, normal Incapacitation due to abnormal movements: None, normal Patient's awareness of abnormal movements (rate only patient's report): No Awareness, Dental Status Current problems with teeth and/or dentures?: No Does patient usually wear dentures?: No  CIWA:  CIWA-Ar Total: 0 COWS:  COWS Total Score: 2  Musculoskeletal: Strength & Muscle Tone: within normal limits Gait & Station: normal Patient leans: N/A  Psychiatric Specialty Exam: Physical Exam  Nursing note and vitals reviewed. Constitutional: She is oriented to person, place, and time. She appears well-developed and well-nourished.  HENT:  Head: Normocephalic and atraumatic.  Respiratory: Effort normal.  Neurological: She is alert and oriented to person, place, and time.    ROS  Blood pressure (!) 142/97, pulse 90, temperature 97.6 F (36.4 C), temperature source Oral, resp. rate 20, height 5\' 7"  (1.702 m), weight 77.6 kg, last menstrual period 05/16/2018, SpO2 100 %.Body mass index is 26.78 kg/m.  General Appearance: Casual  Eye Contact:  Fair   Speech:  Normal Rate  Volume:  Normal  Mood:  Euthymic  Affect:  Congruent  Thought Process:  Coherent and Descriptions of Associations: Intact  Orientation:  Full (Time, Place, and Person)  Thought Content:  Logical  Suicidal Thoughts:  No  Homicidal Thoughts:  No  Memory:  Immediate;   Fair Recent;   Fair Remote;   Fair  Judgement:  Intact  Insight:  Fair  Psychomotor Activity:  Normal  Concentration:  Concentration: Fair and Attention Span: Fair  Recall:  [employer] of Knowledge:  Fair  Language:  Good  Akathisia:  Negative  Handed:  Right  AIMS (if indicated):     Assets:  Desire for Improvement Resilience  ADL's:  Intact  Cognition:  WNL  Sleep:  Number of Hours: 6     Treatment Plan Summary: Daily contact with patient to assess and evaluate symptoms and progress in treatment, Medication management and Plan : Patient is seen and examined.  Patient is a 48 year old female with the above-stated past psychiatric history who is seen in follow-up.   Diagnosis: #1 bipolar disorder, most recently mixed, severe with psychotic features  Patient continues to be improving.  We discussed some of her psychotic features today, but she is less focused on those.  Her sleep is stable with the reduction of the temazepam.  Her Depakote level, liver function enzymes as well as CBC will be drawn tomorrow morning.  No change in her current medications.  We will continue to monitor her blood pressure if necessary to be treated. 1.  Continue Cogentin 0.5 mg p.o. twice daily for extraparametal symptoms. 2.  Continue Depakote 500 mg p.o. nightly.  We will write for a Depakote level on 5/25 as well as a CBC with differential  and liver function enzymes. 3.  Continue hydroxyzine 25 mg p.o. 3 times daily as needed anxiety. 4.  Patient received the long-acting paliperidone injection 234 mg on 05/23/2018.  This is for psychosis and mood stability. 5.  Continue Risperdal 6 mg p.o. nightly for psychosis  and sleep. 6.  Continue temazepam to 15 mg p.o. nightly for insomnia. 7.  Continue trazodone 100 mg p.o. nightly as needed insomnia. 8.  Disposition planning-in progress. Sharma Covert, MD 05/25/2018, 9:12 AM

## 2018-05-25 NOTE — Progress Notes (Signed)
D.  Pt pleasant, but flat on approach, denies complaints at this time.  Pt appears guarded by cooperative and compliant.  Pt was positive for evening wrap up group, minimal but appropriate interaction with peers on the unit.  Pt denies SI/HI/AVH at this time.  A.  Support and encouragement offered, medication given as ordered R.  Pt remains safe on the unit,will continue to monitor.

## 2018-05-26 LAB — HEPATIC FUNCTION PANEL
ALT: 8 U/L (ref 0–44)
AST: 18 U/L (ref 15–41)
Albumin: 3 g/dL — ABNORMAL LOW (ref 3.5–5.0)
Alkaline Phosphatase: 37 U/L — ABNORMAL LOW (ref 38–126)
Bilirubin, Direct: <0.1 mg/dL (ref 0.0–0.2)
Total Bilirubin: 0.2 mg/dL — ABNORMAL LOW (ref 0.3–1.2)
Total Protein: 5.7 g/dL — ABNORMAL LOW (ref 6.5–8.1)

## 2018-05-26 MED ORDER — PALIPERIDONE PALMITATE ER 234 MG/1.5ML IM SUSY: 234.0000 mg | PREFILLED_SYRINGE | INTRAMUSCULAR | 11 refills | Status: DC

## 2018-05-26 MED ORDER — BENZTROPINE MESYLATE 0.5 MG PO TABS: 0.5000 mg | ORAL_TABLET | Freq: Two times a day (BID) | ORAL | 3 refills | Status: DC

## 2018-05-26 MED ORDER — FERROUS SULFATE 325 (65 FE) MG PO TABS: 325.0000 mg | ORAL_TABLET | Freq: Every day | ORAL | 6 refills | Status: DC

## 2018-05-26 MED ORDER — TEMAZEPAM 15 MG PO CAPS: 15.0000 mg | ORAL_CAPSULE | Freq: Every day | ORAL | 0 refills | Status: DC

## 2018-05-26 MED ORDER — LORATADINE 10 MG PO TABS: 10.0000 mg | ORAL_TABLET | Freq: Every day | ORAL | 2 refills | Status: DC

## 2018-05-26 MED ORDER — HALOPERIDOL DECANOATE 100 MG/ML IM SOLN: 100.0000 mg | INTRAMUSCULAR | Status: DC

## 2018-05-26 MED ORDER — DIVALPROEX SODIUM 500 MG PO DR TAB: 500.0000 mg | DELAYED_RELEASE_TABLET | Freq: Every day | ORAL | 1 refills | Status: DC

## 2018-05-26 MED ORDER — RISPERIDONE 3 MG PO TABS: 6.0000 mg | ORAL_TABLET | Freq: Every day | ORAL | 1 refills | Status: DC

## 2018-05-26 MED ORDER — PALIPERIDONE PALMITATE ER 156 MG/ML IM SUSY
156.0000 mg | PREFILLED_SYRINGE | Freq: Once | INTRAMUSCULAR | Status: AC
  Administered 2018-05-26: 156 mg via INTRAMUSCULAR
  Filled 2018-05-26: qty 1

## 2018-05-26 NOTE — Progress Notes (Addendum)
Patient ID: [name], female   DOB: [birth year], 48 y.o.   MRN: [id number]  Discharge Note  Patient received Invega injection as ordered. Patient denies SI/HI and states readiness for discharge.  Written and verbal discharge instructions reviewed with the patient. Patient accepting to information and verbalized understanding with no concerns. All belongings returned to patient from the unit and secured lockers. Patient has completed their Suicide Safety Plan and has been provided Suicide Prevention resources. Patient provided an opportunity to complete and return Patient Satisfaction Survey.   Patient was safely escorted to the lobby for discharge with [employer] present for transport. Patient discharged from [facility] with prescriptions, personal belongings (none secured in lockers), follow-up appointment in place and discharge paperwork.

## 2018-05-26 NOTE — Progress Notes (Signed)
CSW aware of patient's scheduled discharge today. CSW aware of open [facility] CPS case, and CPS's request to be informed of patient's discharge date.  CSW left a detailed voicemail for [place] social worker [place] Pridgin [phone]).   Stephanie Acre, LCSW-A Clinical Social Worker

## 2018-05-26 NOTE — Progress Notes (Signed)
Recreation Therapy Notes  INPATIENT RECREATION TR PLAN  Patient Details Name: [name] MRN: [id number] DOB: 1970/10/31 Today's Date: 05/26/2018  Rec Therapy Plan Is patient appropriate for Therapeutic Recreation?: Yes Treatment times per week: about 3 days Estimated Length of Stay: 5-7 days TR Treatment/Interventions: Group participation (Comment)  Discharge Criteria Pt will be discharged from therapy if:: Discharged Treatment plan/goals/alternatives discussed and agreed upon by:: Patient/family  Discharge Summary Short term goals set: see patient care plan Short term goals met: Adequate for discharge Reason goals not met: n/a Therapeutic equipment acquired: none Reason patient discharged from therapy: Discharge from hospital Pt/family agrees with progress & goals achieved: Yes Date patient discharged from therapy: 05/26/18  Tomi Likens, LRT/CTRS   [place] 05/26/2018, 2:46 PM

## 2018-05-26 NOTE — BHH Suicide Risk Assessment (Signed)
[facility] Discharge Suicide Risk Assessment   Principal Problem: Bipolar affective disorder, mixed, severe, with psychotic behavior ([place]) Discharge Diagnoses: Principal Problem:   Bipolar affective disorder, mixed, severe, with psychotic behavior ([place]) Active Problems:   Bipolar affective disorder, current episode manic ([place])   Bipolar affective disorder, current episode manic with psychotic symptoms ([place])   Total Time spent with patient: 45 minutes  Musculoskeletal: Strength & Muscle Tone: within normal limits Gait & Station: normal Patient leans: N/A  Psychiatric Specialty Exam: ROS  Blood pressure 118/77, pulse 91, temperature 97.9 F (36.6 C), temperature source Oral, resp. rate 20, height 5\' 7"  (1.702 m), weight 77.6 kg, last menstrual period 05/16/2018, SpO2 100 %.Body mass index is 26.78 kg/m.  General Appearance: casual  Eye Contact::  Good  Speech:  Clear and Coherent409  Volume:  Normal  Mood:  Euthymic  Affect:  Congruent  Thought Process:  Goal Directed and Linear  Orientation:  Full (Time, Place, and Person)  Thought Content:  Logical and Tangential  Suicidal Thoughts:  No  Homicidal Thoughts:  No  Memory:  Immediate;   Fair  Judgement:  Fair  Insight:  Fair  Psychomotor Activity:  Normal  Concentration:  Fair  Recall:  [employer] [place]  Language: Fair  Akathisia:  Negative  Handed:  Right  AIMS (if indicated):     Assets:  Communication Skills Desire for Improvement Leisure Time Physical Health Resilience Social Support  Sleep:  Number of Hours: 6.5  Cognition: WNL  ADL's:  Intact  No delusional material expressed or discerned no hallucinations or thoughts of harming self or others discerned or reported  Mental Status Per Nursing Assessment::   On Admission:  NA  Demographic Factors:  Unemployed  Loss Factors: NA  Historical Factors: neg  Risk Reduction Factors:   Sense of responsibility to family and Religious beliefs about  death  Continued Clinical Symptoms:  Schizophrenia:   Paranoid or undifferentiated type  Cognitive Features That Contribute To Risk:  None    Suicide Risk:  Minimal: No identifiable suicidal ideation.  Patients presenting with no risk factors but with morbid ruminations; may be classified as minimal risk based on the severity of the depressive symptoms  Follow-up Information    [facility] Follow up on 06/04/2018.   Why:  Hospital follow up appointment is Wednesday, 6/3 at 9:30a.  The appointment will be held over the phone and the provider will contact you.  Contact information: [address] [place] [address] [phone]           Plan Of Care/Follow-up recommendations:  Activity:  full  Josemaria Brining, MD 05/26/2018, 10:00 AM

## 2018-05-26 NOTE — Progress Notes (Signed)
  [facility] Adult Case Management Discharge Plan :  Will you be returning to the same living situation after discharge:  Yes,  her home At discharge, do you have transportation home?: Yes,  sheriff/police department Do you have the ability to pay for your medications: Yes,  medicaid  Release of information consent forms completed and in the chart;  Patient's signature needed at discharge.  Patient to Follow up at: Follow-up Information    [facility] Follow up on 06/04/2018.   Why:  Hospital follow up appointment is Wednesday, 6/3 at 9:30a.  The appointment will be held over the phone and the provider will contact you.  Contact information: [place] [address] [phone]           Next level of care provider has access to [place] and Suicide Prevention discussed: No.; consent given for son but never found a number  Have you used any form of tobacco in the last 30 days? (Cigarettes, Smokeless Tobacco, Cigars, and/or Pipes): No  Has patient been referred to the Quitline?: N/A patient is not a smoker  Patient has been referred for addiction treatment: [place], LCSW 05/26/2018, 11:23 AM

## 2018-05-26 NOTE — Discharge Summary (Signed)
Physician Discharge Summary Note  Patient:  [name] is an 48 y.o., female MRN:  [id number] DOB:  [birth year] Patient phone:  [phone] (home)  Patient address:   [address] [place] [address],  Total Time spent with patient: 45 minutes  Date of Admission:  05/16/2018 Date of Discharge: 05/26/2018  Reason for Admission:    This is at least the second psychiatric admission that we are aware of for this 48 year old patient who was petition by family member due to threats of suicide, auditory hallucinations, and she has a history of aggressive behavior when psychotic.  She carries a diagnosis of a bipolar disorder with psychosis.  The patient herself denies all issues and is extremely paranoid and her statements.  The patient basically keeps telling me that there is a big organization against her and numerous people are working against her but she is very vague about these terms and we cannot pin down any forms or specific aspects of these delusions.  This is consistent with her prior exam when she was just extremely vague but extremely paranoid at the same time.  She states she does not need to be here that is a misunderstanding that her concerns are justified so forth.  When asked specifically she denies thoughts of harming self or others she denies auditory and visual hallucinations. She denied substance abuse She reported prior treatment but minimize it and refused to elaborate on past treatments. She denies medical issues head trauma seizures or neurological issues  According to our PAs assessment of 5/15 History of Present Illness:47 y.o.femalewho presents involuntarily to [facility] via [facility]. Pt was petitioned by her brother, Axelle Szwed [phone] for suicidal statements & auditory hallucinations. Pt has a history ofBipolar dx and was [facility] to [facility] with aggressive behavior.Pt denies all symptoms of SI, Depression & psychosis. She states she does  not know the name of what is harassing her. She states she has been bothered by something for years. Pt states it has ruined everything in her house- even her clothes and her hair. Pt denies past SI, HI & mental health tx.Pt denies sx of depression except increased tearfulness. Pt states current stressors includefinancial after resigning from her job. She reports she sleeps 5-6 hours a night and that she has recent weight loss. Pt liveswith her 3 children, ages 64, 73 & 72. Pt states her 46 year old is supportive and helpful to her.Pt haslimitedinsight and judgment. Pt's memory is intact.Per brother, legal history includescharges against pt for spitting on a stranger. Principal Problem: Bipolar affective disorder, mixed, severe, with psychotic behavior ([place]) Discharge Diagnoses: Principal Problem:   Bipolar affective disorder, mixed, severe, with psychotic behavior ([place]) Active Problems:   Bipolar affective disorder, current episode manic ([place])   Bipolar affective disorder, current episode manic with psychotic symptoms ([place])   Past Psychiatric History: past use of haldol dec  Past Medical History:  Past Medical History:  Diagnosis Date  . Anxiety   . Depression   . Hypertension     Past Surgical History:  Procedure Laterality Date  . CESAREAN SECTION  2002, 2009   Family History:  Family History  Problem Relation Age of Onset  . Breast cancer Mother        Deceased, 73  . Hypertension Mother   . Arthritis Father   . Hypertension Father   . Healthy Son   . Asthma Daughter   . Diabetes type II Daughter    Family Psychiatric  History: neg  Social History:  Social History   Substance and Sexual Activity  Alcohol Use No     Social History   Substance and Sexual Activity  Drug Use No    Social History   Socioeconomic History  . Marital status: Married    Spouse name: Not on file  . Number of children: Not on file  . Years of education: Not on file  . Highest  education level: Not on file  Occupational History  . Not on file  Social Needs  . Financial resource strain: Not on file  . Food insecurity:    Worry: Not on file    Inability: Not on file  . Transportation needs:    Medical: Not on file    Non-medical: Not on file  Tobacco Use  . Smoking status: Never Smoker  . Smokeless tobacco: Never Used  Substance and Sexual Activity  . Alcohol use: No  . Drug use: No  . Sexual activity: Not on file  Lifestyle  . Physical activity:    Days per week: Not on file    Minutes per session: Not on file  . Stress: Not on file  Relationships  . Social connections:    Talks on phone: Not on file    Gets together: Not on file    Attends religious service: Not on file    Active member of club or organization: Not on file    Attends meetings of clubs or organizations: Not on file    Relationship status: Not on file  Other Topics Concern  . Not on file  Social History Narrative   She lives at home with 2 sons (25, 45) and 1 daughter (22).   She is currently not working.  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  She moved from [place] in 2013.    Hospital Course:   As here the patient generally stayed to herself stayed in her room and her paranoid ideation did decrease.  At the point of discharge I found her to be not paranoid at all she was alert and oriented had no specific or vague fears, should be noted that her paranoia became more vague until it dissipated.  At any rate she had no EPS or TD or side effects with meds.  Further, she had become noncompliant with Haldol for nonspecific reasons just tell me she did not like the drug but could not be more specific so we switched her to [place] after stabilization on Risperdal and I do think she can benefit from the overlap for at least a period of time, maybe indefinitely.  But at the point of discharge she is pleasant cooperative without thoughts of  harming self or others contracting fully.  I spoke with her father with her permission he lives in [place] he was eager that the patient attend to her basic needs make sure her rent was paid electricity paid so forth and I elaborated this to her.  She told me she had no such worries at this point in time.  Musculoskeletal: Strength & Muscle Tone: within normal limits Gait & Station: normal Patient leans: N/A  Psychiatric Specialty Exam: ROS  Blood pressure 118/77, pulse 91, temperature 97.9 F (36.6 C), temperature source Oral, resp. rate 20, height 5\' 7"  (1.702 m), weight 77.6 kg, last menstrual period 05/16/2018, SpO2 100 %.Body mass index is 26.78 kg/m.  General Appearance: casual  Eye Contact::  Good  Speech:  Clear and Coherent409  Volume:  Normal  Mood:  Euthymic  Affect:  Congruent  Thought Process:  Goal Directed and Linear  Orientation:  Full (Time, Place, and Person)  Thought Content:  Logical and Tangential  Suicidal Thoughts:  No  Homicidal Thoughts:  No  Memory:  Immediate;   Fair  Judgement:  Fair  Insight:  Fair  Psychomotor Activity:  Normal  Concentration:  Fair  Recall:  [employer] [place]  Language: Fair  Akathisia:  Negative  Handed:  Right  AIMS (if indicated):     Assets:  Communication Skills Desire for Improvement Leisure Time Physical Health Resilience Social Support  Sleep:  Number of Hours: 6.5  Cognition: WNL  ADL's:  Intact  No delusional material expressed or discerned no hallucinations or thoughts of harming self or others discerned or reported  Physical Findings: AIMS: Facial and Oral Movements Muscles of Facial Expression: None, normal Lips and Perioral Area: None, normal Jaw: None, normal Tongue: None, normal,Extremity Movements Upper (arms, wrists, hands, fingers): None, normal Lower (legs, knees, ankles, toes): None, normal, Trunk Movements Neck, shoulders, hips: None, normal, Overall Severity Severity of abnormal  movements (highest score from questions above): None, normal Incapacitation due to abnormal movements: None, normal Patient's awareness of abnormal movements (rate only patient's report): No Awareness, Dental Status Current problems with teeth and/or dentures?: No Does patient usually wear dentures?: No  CIWA:  CIWA-Ar Total: 0 COWS:  COWS Total Score: 2  Have you used any form of tobacco in the last 30 days? (Cigarettes, Smokeless Tobacco, Cigars, and/or Pipes): No  Has this patient used any form of tobacco in the last 30 days? (Cigarettes, Smokeless Tobacco, Cigars, and/or Pipes) Yes, No  Blood Alcohol level:  Lab Results  Component Value Date   ETH <10 12/27/2016   ETH  03/22/2007    <5        LOWEST DETECTABLE LIMIT FOR SERUM ALCOHOL IS 11 mg/dL FOR MEDICAL PURPOSES ONLY    Metabolic Disorder Labs:  Lab Results  Component Value Date   HGBA1C 5.1 12/29/2016   MPG 99.67 12/29/2016   No results found for: PROLACTIN Lab Results  Component Value Date   CHOL 147 12/29/2016   TRIG 43 12/29/2016   HDL 54 12/29/2016   CHOLHDL 2.7 12/29/2016   VLDL 9 12/29/2016   LDLCALC 84 12/29/2016    See Psychiatric Specialty Exam and Suicide Risk Assessment completed by Attending Physician prior to discharge.  Discharge destination:  Home  Is patient on multiple antipsychotic therapies at discharge:  No   Has Patient had three or more failed trials of antipsychotic monotherapy by history:  No  Recommended Plan for Multiple Antipsychotic Therapies: NA   Allergies as of 05/26/2018      Reactions   Latex       Medication List    STOP taking these medications   haloperidol decanoate 100 MG/ML injection Commonly known as:  HALDOL DECANOATE   OLANZapine 15 MG tablet Commonly known as:  ZYPREXA   traZODone 100 MG tablet Commonly known as:  DESYREL     TAKE these medications     Indication  benztropine 0.5 MG tablet Commonly known as:  COGENTIN Take 1 tablet (0.5 mg  total) by mouth 2 (two) times daily.  Indication:  Extrapyramidal Reaction caused by Medications   divalproex 500 MG DR tablet Commonly known as:  DEPAKOTE Take 1 tablet (500 mg total) by mouth at bedtime. What changed:  medication strength  how much to take  when to take this  Indication:  Manic Phase of Manic-Depression   docusate sodium 100 MG capsule Commonly known as:  COLACE Take 1 capsule (100 mg total) by mouth daily with breakfast.  Indication:  Constipation   ferrous sulfate 325 (65 FE) MG tablet Take 1 tablet (325 mg total) by mouth daily with breakfast.  Indication:  Iron Deficiency, Anemia From Inadequate Iron in the Body   loratadine 10 MG tablet Commonly known as:  CLARITIN Take 1 tablet (10 mg total) by mouth daily. Start taking on:  May 27, 2018  Indication:  Hayfever   paliperidone 234 MG/1.5ML Susy injection Commonly known as:  Lorayne Bender Sustenna Inject 234 mg into the muscle every 28 (twenty-eight) days. Due 6/15  Indication:  Schizophrenia   risperiDONE 3 MG tablet Commonly known as:  RISPERDAL Take 2 tablets (6 mg total) by mouth at bedtime.  Indication:  Delusions   temazepam 15 MG capsule Commonly known as:  RESTORIL Take 1 capsule (15 mg total) by mouth at bedtime.  Indication:  Trouble Sleeping      Follow-up Information    [facility] Follow up on 06/04/2018.   Why:  Hospital follow up appointment is Wednesday, 6/3 at 9:30a.  The appointment will be held over the phone and the provider will contact you.  Contact information: [address] [place] [address] [phone]           Follow-up recommendations:  Activity:  full  SignedJohnn Hai, MD 05/26/2018, 10:10 AM

## 2018-05-27 NOTE — Care Management (Signed)
CMA attempted to contact patient (phone number on facesheet) to inform her of aftercare appointment, however, patient's number is currently not in service.    CMA contacted patient's father, [name], CMA informed him of patient's appointment with [facility] on Thursday, 5/38/2020 at 10:00a.   Patient's father gave CMA correct phone number for [facility] to contact patient at and he will inform patient with appointment time and date.   Bora Broner Care Management Assistant  Email:[email or web address] Office: [phone]

## 2019-07-28 ENCOUNTER — Encounter (HOSPITAL_COMMUNITY): Payer: Self-pay | Admitting: Emergency Medicine

## 2019-07-28 ENCOUNTER — Emergency Department (HOSPITAL_COMMUNITY)
Admission: EM | Admit: 2019-07-28 | Discharge: 2019-07-29 | Disposition: A | Discharge status: Other Acute Inpatient Hospital | Payer: Medicaid Other | Attending: Emergency Medicine | Admitting: Emergency Medicine

## 2019-07-28 ENCOUNTER — Other Ambulatory Visit: Payer: Self-pay

## 2019-07-28 ENCOUNTER — Ambulatory Visit (HOSPITAL_COMMUNITY)
Admission: EM | Admit: 2019-07-28 | Discharge: 2019-07-28 | Disposition: A | Discharge status: Other Acute Inpatient Hospital | Payer: Medicaid Other | Attending: Psychiatry | Admitting: Psychiatry

## 2019-07-28 DIAGNOSIS — F3189 Other bipolar disorder: Secondary | ICD-10-CM | POA: Insufficient documentation

## 2019-07-28 DIAGNOSIS — F419 Anxiety disorder, unspecified: Secondary | ICD-10-CM | POA: Diagnosis not present

## 2019-07-28 DIAGNOSIS — F329 Major depressive disorder, single episode, unspecified: Secondary | ICD-10-CM | POA: Insufficient documentation

## 2019-07-28 DIAGNOSIS — Z20822 Contact with and (suspected) exposure to covid-19: Secondary | ICD-10-CM | POA: Diagnosis not present

## 2019-07-28 DIAGNOSIS — I10 Essential (primary) hypertension: Secondary | ICD-10-CM | POA: Insufficient documentation

## 2019-07-28 DIAGNOSIS — F312 Bipolar disorder, current episode manic severe with psychotic features: Secondary | ICD-10-CM

## 2019-07-28 DIAGNOSIS — F29 Unspecified psychosis not due to a substance or known physiological condition: Secondary | ICD-10-CM | POA: Insufficient documentation

## 2019-07-28 LAB — CBC WITH DIFFERENTIAL/PLATELET
Abs Immature Granulocytes: 0.02 10*3/uL (ref 0.00–0.07)
Basophils Absolute: 0.1 10*3/uL (ref 0.0–0.1)
Basophils Relative: 1 %
Eosinophils Absolute: 0 10*3/uL (ref 0.0–0.5)
Eosinophils Relative: 0 %
HCT: 34.5 % — ABNORMAL LOW (ref 36.0–46.0)
Hemoglobin: 10.4 g/dL — ABNORMAL LOW (ref 12.0–15.0)
Immature Granulocytes: 0 %
Lymphocytes Relative: 26 %
Lymphs Abs: 1.5 10*3/uL (ref 0.7–4.0)
MCH: 25.6 pg — ABNORMAL LOW (ref 26.0–34.0)
MCHC: 30.1 g/dL (ref 30.0–36.0)
MCV: 84.8 fL (ref 80.0–100.0)
Monocytes Absolute: 0.4 10*3/uL (ref 0.1–1.0)
Monocytes Relative: 8 %
Neutro Abs: 3.6 10*3/uL (ref 1.7–7.7)
Neutrophils Relative %: 65 %
Platelets: 363 10*3/uL (ref 150–400)
RBC: 4.07 MIL/uL (ref 3.87–5.11)
RDW: 15.3 % (ref 11.5–15.5)
WBC: 5.6 10*3/uL (ref 4.0–10.5)
nRBC: 0 % (ref 0.0–0.2)

## 2019-07-28 LAB — BASIC METABOLIC PANEL
Anion gap: 8 (ref 5–15)
BUN: 16 mg/dL (ref 6–20)
CO2: 23 mmol/L (ref 22–32)
Calcium: 8.8 mg/dL — ABNORMAL LOW (ref 8.9–10.3)
Chloride: 109 mmol/L (ref 98–111)
Creatinine, Ser: 0.52 mg/dL (ref 0.44–1.00)
GFR calc Af Amer: >60 mL/min (ref >60)
GFR calc non Af Amer: >60 mL/min (ref >60)
Glucose, Bld: 98 mg/dL (ref 70–99)
Potassium: 3.3 mmol/L — ABNORMAL LOW (ref 3.5–5.1)
Sodium: 140 mmol/L (ref 135–145)

## 2019-07-28 LAB — ETHANOL: Alcohol, Ethyl (B): <10 mg/dL (ref <10)

## 2019-07-28 LAB — RAPID URINE DRUG SCREEN, HOSP PERFORMED
Amphetamines: NOT DETECTED
Barbiturates: NOT DETECTED
Benzodiazepines: NOT DETECTED
Cocaine: NOT DETECTED
Opiates: NOT DETECTED
Tetrahydrocannabinol: NOT DETECTED

## 2019-07-28 LAB — SARS CORONAVIRUS 2 BY RT PCR (HOSPITAL ORDER, PERFORMED IN ~~LOC~~ HOSPITAL LAB): SARS Coronavirus 2: NEGATIVE

## 2019-07-28 LAB — I-STAT BETA HCG BLOOD, ED (MC, WL, AP ONLY): I-stat hCG, quantitative: <5 m[IU]/mL (ref <5)

## 2019-07-28 MED ORDER — BENZTROPINE MESYLATE 0.5 MG PO TABS
0.5000 mg | ORAL_TABLET | Freq: Two times a day (BID) | ORAL | Status: DC
  Administered 2019-07-29 (×2): 0.5 mg via ORAL
  Filled 2019-07-28 (×2): qty 1

## 2019-07-28 MED ORDER — DIVALPROEX SODIUM 500 MG PO DR TAB
500.0000 mg | DELAYED_RELEASE_TABLET | Freq: Every day | ORAL | Status: DC
  Administered 2019-07-29: 500 mg via ORAL
  Filled 2019-07-28: qty 1

## 2019-07-28 MED ORDER — FERROUS SULFATE 325 (65 FE) MG PO TABS
325.0000 mg | ORAL_TABLET | Freq: Every day | ORAL | Status: DC
  Administered 2019-07-29: 325 mg via ORAL
  Filled 2019-07-28: qty 1

## 2019-07-28 MED ORDER — TEMAZEPAM 15 MG PO CAPS
15.0000 mg | ORAL_CAPSULE | Freq: Every day | ORAL | Status: DC
  Administered 2019-07-29: 15 mg via ORAL
  Filled 2019-07-28: qty 1

## 2019-07-28 NOTE — ED Provider Notes (Signed)
  Physical Exam  BP (!) 154/102   Pulse 63   Temp 98.8 F (37.1 C) (Oral)   Resp 18   SpO2 100%   Physical Exam  ED Course/Procedures     Procedures  MDM  Care assumed at 3 PM. Patient's labs are pending and if labs are unremarkable, patient can be medically cleared.  5 pm Labs were unremarkable. Medically cleared for psych eval.   Drenda Freeze, MD 07/28/19 2311

## 2019-07-28 NOTE — BHH Counselor (Signed)
This counselor has completed IVC Affidavit and Petition as well as First Examination and faxed to [employer]. Laurell Roof, [facility] to call [employer] for service and transportation upon receipt of Findings and Custody.
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
## 2019-07-28 NOTE — Care Management (Signed)
Writer referred patient to inpatient psych facilities.

## 2019-07-28 NOTE — ED Notes (Signed)
Daughter lives with pt. [name] [phone].
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
## 2019-07-28 NOTE — Progress Notes (Signed)
Patient's sister contact the [facility] and notified that he [atient cannot take Risperdal because in the past it caused her eye muscles to relax and the patient had to have botox injections to correct it.
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
## 2019-07-28 NOTE — ED Notes (Signed)
Pt was given hospital scrubs and she put them on top of her dress. This writer was able to get her shoes, belt and purse .  2 bags of belongings placed in locker 29.

## 2019-07-28 NOTE — ED Provider Notes (Signed)
[facility] DEPT Provider Note   CSN: [id number] Arrival date & time: 07/28/19  1411     History Chief Complaint  Patient presents with  . Medical Clearance    [name] is a 49 y.o. female.  Patient is a 49 year old female with a history of bipolar disease, psychosis who is presenting today from behavioral health under IVC commitment.  Patient was brought there by her sister who flew in from [place] to help organize what was going on.  Patient has had increasingly erratic behavior.  She has been cleaning her floors with antifreeze and putting salt in various places in her home.  Patient quickly escalates and yells intermittently and has been agitated when with behavioral health.  Here patient is not answering any questions but sitting in the bed rocking back and forth stating she would like some juice.  Unclear if patient is taking any medications at this time.  The history is provided by medical records.       Past Medical History:  Diagnosis Date  . Anxiety   . Depression   . Hypertension     Patient Active Problem List   Diagnosis Date Noted  . Bipolar affective disorder, current episode manic ([place]) 05/18/2018  . Bipolar affective disorder, current episode manic with psychotic symptoms ([place]) 05/18/2018  . Uterine fibroid 02/06/2017  . Bipolar affective disorder, mixed, severe, with psychotic behavior ([place]) 12/28/2016  . Bipolar I disorder, current or most recent episode manic, with psychotic features ([place]) 12/28/2016  . 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 CESAREAN SECTION  2002, 2009     OB History    Gravida  3   Para  3   Term  3   Preterm      AB      Living        SAB      TAB      Ectopic      Multiple      Live Births  3           Family History  Problem Relation Age of Onset  . Breast cancer Mother        Deceased, 76  . Hypertension  Mother   . Arthritis Father   . Hypertension Father   . Healthy Son   . Asthma Daughter   . Diabetes type II Daughter     Social History   Tobacco Use  . Smoking status: Never Smoker  . Smokeless tobacco: Never Used  Substance Use Topics  . Alcohol use: No  . Drug use: No    Home Medications Prior to Admission medications   Medication Sig Start Date End Date Taking? Authorizing Provider  benztropine (COGENTIN) 0.5 MG tablet Take 1 tablet (0.5 mg total) by mouth 2 (two) times daily. 05/26/18   [name], MD  divalproex (DEPAKOTE) 500 MG DR tablet Take 1 tablet (500 mg total) by mouth at bedtime. 05/26/18   [name], MD  docusate sodium (COLACE) 100 MG capsule Take 1 capsule (100 mg total) by mouth daily with breakfast. Patient not taking: Reported on 05/17/2018 01/04/17   [name], Herma Ard B, MD  ferrous sulfate 325 (65 FE) MG tablet Take 1 tablet (325 mg total) by mouth daily with breakfast. 05/26/18   [name], MD  loratadine (CLARITIN) 10 MG tablet Take 1 tablet (10 mg total) by mouth daily. 05/27/18   [name], MD  paliperidone (INVEGA SUSTENNA) 234 MG/1.5ML SUSY injection Inject 234 mg into the muscle every 28 (twenty-eight) days. Due 6/15 05/26/18   [name], MD  risperiDONE (RISPERDAL) 3 MG tablet Take 2 tablets (6 mg total) by mouth at bedtime. 05/26/18   [name], MD  temazepam (RESTORIL) 15 MG capsule Take 1 capsule (15 mg total) by mouth at bedtime. 05/26/18   [name], MD    Allergies    Latex  Review of Systems   Review of Systems  Unable to perform ROS: Psychiatric disorder    Physical Exam Updated Vital Signs BP (!) 176/108 (BP Location: Right Arm)   Pulse 98   Temp 98.1 F (36.7 C) (Oral)   Resp 20   SpO2 100%   Physical Exam Vitals and nursing note reviewed.  Constitutional:      Appearance: Normal appearance. She is normal weight.     Comments: agitated  HENT:     Head: Normocephalic.     Mouth/Throat:     Mouth: Mucous  membranes are moist.  Eyes:     Pupils: Pupils are equal, round, and reactive to light.  Cardiovascular:     Rate and Rhythm: Normal rate and regular rhythm.     Pulses: Normal pulses.  Pulmonary:     Effort: Pulmonary effort is normal.     Breath sounds: Normal breath sounds.  Abdominal:     General: Abdomen is flat. There is no distension.     Palpations: Abdomen is soft.     Tenderness: There is no abdominal tenderness.  Musculoskeletal:     Right lower leg: No edema.     Left lower leg: No edema.  Skin:    General: Skin is warm and dry.  Neurological:     General: No focal deficit present.     Mental Status: She is alert.  Psychiatric:        Mood and Affect: Affect is angry.        Behavior: Behavior is agitated.     Comments: Pt is guarded.  Rocking in the bed and appears agitated.  She does not give any information.  But said she would like some juice.     ED Results / Procedures / Treatments   Labs (all labs ordered are listed, but only abnormal results are displayed) Labs Reviewed  SARS CORONAVIRUS 2 BY RT PCR (HOSPITAL ORDER, [facility])  CBC WITH DIFFERENTIAL/PLATELET  BASIC METABOLIC PANEL  ETHANOL  RAPID URINE DRUG SCREEN, HOSP PERFORMED  I-STAT BETA HCG BLOOD, ED ([facility], [facility], [facility] ONLY)    EKG None  Radiology No results found.  Procedures Procedures (including critical care time)  Medications Ordered in ED Medications - No data to display  ED Course  I have reviewed the triage vital signs and the nursing notes.  Pertinent labs & imaging results that were available during my care of the patient were reviewed by me and considered in my medical decision making (see chart for details).    MDM Rules/Calculators/A&P                          Patient presenting today under IVC commitment for psychosis.  Patient seen at behavioral health and sent here due to her low level of agitation.  Patient does appear agitated in the room but  is directable.  Exam without significant findings except for hypertension which is most likely related to her agitation.  She is currently cooperative at this time.  Screening labs pending but otherwise medically clear.  Psychiatry for disposition.   Final Clinical Impression(s) / ED Diagnoses Final diagnoses:  Psychosis, unspecified psychosis type [facility])    Rx / DC Orders ED Discharge Orders    None       Blanchie Dessert, MD 07/28/19 [phone]

## 2019-07-28 NOTE — ED Notes (Signed)
This writer attempted to draw blood x1 right AC without success.  Called phlebotomy to draw.

## 2019-07-29 ENCOUNTER — Inpatient Hospital Stay (HOSPITAL_COMMUNITY)
Admission: AD | Admit: 2019-07-29 | Discharge: 2019-08-04 | DRG: 885 | Disposition: A | Discharge status: Home / Self Care | Payer: Medicaid Other | Attending: Psychiatry | Admitting: Psychiatry

## 2019-07-29 ENCOUNTER — Other Ambulatory Visit: Payer: Self-pay

## 2019-07-29 ENCOUNTER — Encounter (HOSPITAL_COMMUNITY): Payer: Self-pay | Admitting: Psychiatry

## 2019-07-29 ENCOUNTER — Other Ambulatory Visit: Payer: Self-pay | Admitting: Psychiatric/Mental Health

## 2019-07-29 DIAGNOSIS — F419 Anxiety disorder, unspecified: Secondary | ICD-10-CM | POA: Diagnosis present

## 2019-07-29 DIAGNOSIS — Z8249 Family history of ischemic heart disease and other diseases of the circulatory system: Secondary | ICD-10-CM

## 2019-07-29 DIAGNOSIS — D509 Iron deficiency anemia, unspecified: Secondary | ICD-10-CM | POA: Diagnosis present

## 2019-07-29 DIAGNOSIS — Z91040 Latex allergy status: Secondary | ICD-10-CM

## 2019-07-29 DIAGNOSIS — I10 Essential (primary) hypertension: Secondary | ICD-10-CM | POA: Diagnosis present

## 2019-07-29 DIAGNOSIS — Z9114 Patient's other noncompliance with medication regimen: Secondary | ICD-10-CM | POA: Diagnosis not present

## 2019-07-29 DIAGNOSIS — Z888 Allergy status to other drugs, medicaments and biological substances status: Secondary | ICD-10-CM | POA: Diagnosis not present

## 2019-07-29 DIAGNOSIS — F29 Unspecified psychosis not due to a substance or known physiological condition: Secondary | ICD-10-CM | POA: Diagnosis not present

## 2019-07-29 DIAGNOSIS — Z79899 Other long term (current) drug therapy: Secondary | ICD-10-CM | POA: Diagnosis not present

## 2019-07-29 DIAGNOSIS — Z560 Unemployment, unspecified: Secondary | ICD-10-CM

## 2019-07-29 DIAGNOSIS — G4700 Insomnia, unspecified: Secondary | ICD-10-CM | POA: Diagnosis present

## 2019-07-29 DIAGNOSIS — F312 Bipolar disorder, current episode manic severe with psychotic features: Principal | ICD-10-CM | POA: Diagnosis present

## 2019-07-29 MED ORDER — OLANZAPINE 5 MG PO TBDP: 5.0000 mg | ORAL_TABLET | Freq: Three times a day (TID) | ORAL | Status: DC | PRN

## 2019-07-29 MED ORDER — DIVALPROEX SODIUM 500 MG PO DR TAB
500.0000 mg | DELAYED_RELEASE_TABLET | Freq: Every day | ORAL | Status: DC
  Administered 2019-07-29 – 2019-07-31 (×3): 500 mg via ORAL
  Filled 2019-07-29 (×6): qty 1

## 2019-07-29 MED ORDER — LORAZEPAM 1 MG PO TABS: 1.0000 mg | ORAL_TABLET | ORAL | Status: DC | PRN

## 2019-07-29 MED ORDER — MAGNESIUM HYDROXIDE 400 MG/5ML PO SUSP: 30.0000 mL | Freq: Every day | ORAL | Status: DC | PRN

## 2019-07-29 MED ORDER — OLANZAPINE 5 MG PO TABS
5.0000 mg | ORAL_TABLET | Freq: Every morning | ORAL | Status: DC
  Administered 2019-07-29: 5 mg via ORAL
  Filled 2019-07-29: qty 1

## 2019-07-29 MED ORDER — TRAZODONE HCL 50 MG PO TABS: 50.0000 mg | ORAL_TABLET | Freq: Every evening | ORAL | Status: DC | PRN

## 2019-07-29 MED ORDER — HYDROXYZINE HCL 25 MG PO TABS: 25.0000 mg | ORAL_TABLET | Freq: Three times a day (TID) | ORAL | Status: DC | PRN

## 2019-07-29 MED ORDER — ACETAMINOPHEN 325 MG PO TABS: 650.0000 mg | ORAL_TABLET | Freq: Four times a day (QID) | ORAL | Status: DC | PRN

## 2019-07-29 MED ORDER — ZIPRASIDONE MESYLATE 20 MG IM SOLR: 20.0000 mg | INTRAMUSCULAR | Status: DC | PRN

## 2019-07-29 MED ORDER — OLANZAPINE 5 MG PO TBDP: 5.0000 mg | ORAL_TABLET | Freq: Two times a day (BID) | ORAL | Status: DC | PRN

## 2019-07-29 MED ORDER — FERROUS SULFATE 325 (65 FE) MG PO TABS
325.0000 mg | ORAL_TABLET | Freq: Every day | ORAL | Status: DC
  Administered 2019-07-30 – 2019-08-04 (×6): 325 mg via ORAL
  Filled 2019-07-29 (×8): qty 1

## 2019-07-29 MED ORDER — TEMAZEPAM 15 MG PO CAPS
15.0000 mg | ORAL_CAPSULE | Freq: Every evening | ORAL | Status: DC | PRN
  Administered 2019-07-29 – 2019-07-30 (×2): 15 mg via ORAL
  Filled 2019-07-29 (×2): qty 1

## 2019-07-29 MED ORDER — ALUM & MAG HYDROXIDE-SIMETH 200-200-20 MG/5ML PO SUSP: 30.0000 mL | ORAL | Status: DC | PRN

## 2019-07-29 MED ORDER — BENZTROPINE MESYLATE 0.5 MG PO TABS
0.5000 mg | ORAL_TABLET | Freq: Two times a day (BID) | ORAL | Status: DC
  Administered 2019-07-29 – 2019-07-30 (×3): 0.5 mg via ORAL
  Filled 2019-07-29 (×7): qty 1

## 2019-07-29 MED ORDER — OLANZAPINE 10 MG PO TABS: 10.0000 mg | ORAL_TABLET | Freq: Every day | ORAL | Status: DC

## 2019-07-29 MED ORDER — OLANZAPINE 5 MG PO TABS
5.0000 mg | ORAL_TABLET | Freq: Two times a day (BID) | ORAL | Status: DC
  Administered 2019-07-29 – 2019-08-01 (×6): 5 mg via ORAL
  Filled 2019-07-29 (×5): qty 1
  Filled 2019-07-29: qty 2
  Filled 2019-07-29 (×3): qty 1

## 2019-07-29 NOTE — Progress Notes (Signed)
   07/29/19 2044  Psych Admission Type (Psych Patients Only)  Admission Status Involuntary  Psychosocial Assessment  Patient Complaints Irritability;Tension  Eye Contact Brief  Facial Expression Animated;Anxious  Affect Anxious;Irritable  Speech Logical/coherent;Argumentative  Interaction Assertive;Guarded  Motor Activity Slow  Appearance/Hygiene In scrubs  Behavior Characteristics Irritable;Cooperative  Thought Process  Coherency WDL  Content WDL  Delusions None reported or observed  Perception WDL  Hallucination None reported or observed  Judgment Impaired  Confusion None  Danger to Self  Current suicidal ideation? Denies  Danger to Others  Danger to Others None reported or observed   Pt denies SI, HI, AVH. "I am here because they made me come here."

## 2019-07-29 NOTE — BH Assessment (Addendum)
[facility] Assessment Progress Note  Per Harriett Sine, FNP, this pt requires psychiatric hospitalization.  Kathalene Frames, RN has assigned pt to [facility] Rm 501-1; [facility] will be ready to receive pt after 20:00.  Pt presents under IVC initiated by Orvis Brill, LCSW, and IVC documents have been faxed to [facility].  Pt's nurse, Eustaquio Maize, has been notified, and agrees to call report to [phone].  Pt is to be transported via Event organiser.   Jalene Mullet, [place] Coordinator [phone]
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
## 2019-07-29 NOTE — ED Notes (Signed)
Pt has been medication complete. Pt resting, sleeping at this time.

## 2019-07-29 NOTE — ED Notes (Signed)
Patient easily agitated over any interaction or questioning, one word answers only.
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
## 2019-07-29 NOTE — ED Notes (Signed)
Pt talking to self. Pt irritated.
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
## 2019-07-30 DIAGNOSIS — F312 Bipolar disorder, current episode manic severe with psychotic features: Principal | ICD-10-CM

## 2019-07-30 LAB — LIPID PANEL
Cholesterol: 159 mg/dL (ref 0–200)
HDL: 54 mg/dL (ref >40)
LDL Cholesterol: 97 mg/dL (ref 0–99)
Total CHOL/HDL Ratio: 2.9 RATIO
Triglycerides: 38 mg/dL (ref <150)
VLDL: 8 mg/dL (ref 0–40)

## 2019-07-30 LAB — VALPROIC ACID LEVEL: Valproic Acid Lvl: 58 ug/mL (ref 50.0–100.0)

## 2019-07-30 LAB — TSH: TSH: 3.131 u[IU]/mL (ref 0.350–4.500)

## 2019-07-30 LAB — HEMOGLOBIN A1C
Hgb A1c MFr Bld: 5 % (ref 4.8–5.6)
Mean Plasma Glucose: 96.8 mg/dL

## 2019-07-30 MED ORDER — BENZTROPINE MESYLATE 0.5 MG PO TABS: 0.5000 mg | ORAL_TABLET | Freq: Two times a day (BID) | ORAL | Status: DC | PRN

## 2019-07-30 MED ORDER — LISINOPRIL 10 MG PO TABS
10.0000 mg | ORAL_TABLET | Freq: Every day | ORAL | Status: DC
  Administered 2019-07-31: 10 mg via ORAL
  Filled 2019-07-30 (×3): qty 1

## 2019-07-30 MED ORDER — CLONIDINE HCL 0.2 MG PO TABS
0.2000 mg | ORAL_TABLET | Freq: Once | ORAL | Status: AC
  Administered 2019-07-30: 0.2 mg via ORAL
  Filled 2019-07-30: qty 2
  Filled 2019-07-30: qty 1

## 2019-07-30 NOTE — Plan of Care (Signed)
Nurse discussed depression, anxiety and hopeless coping skills with patient.

## 2019-07-30 NOTE — Progress Notes (Signed)
Recreation Therapy Notes  INPATIENT RECREATION THERAPY ASSESSMENT  Patient Details Name: [name] MRN: [id number] DOB: [birth year] Today's Date: 07/30/2019       Information Obtained From: Patient  Able to Participate in Assessment/Interview: Yes  Patient Presentation: Alert  Reason for Admission (Per Patient): Other (Comments) ("Initial screening")  Patient Stressors:  (None identified)  Coping Skills:   Journal, Sports, TV, Music, Exercise, Talk, Prayer, Read, Hot Bath/Shower  Leisure Interests (2+):  Social - Family, Social - Friends, Individual - Other (Comment) (Go for a Educational psychologist)  Frequency of Recreation/Participation: Other (Comment)  Awareness of Community Resources:  Yes  Community Resources:  Park, Other (Comment) (Stores)  Current Use: Yes  If no, Barriers?:    Expressed Interest in [place]: No  [employer] of Residence:  [place]  Patient Main Form of Transportation: Walk  Patient Strengths:  Employment; Armed forces technical officer; Environment  Patient Identified Areas of Improvement:  "Why insane things insane so much"  Patient Goal for Hospitalization:  "exercise in the screeening and get discharged"  Current SI (including self-harm):  No  Current HI:  No  Current AVH: No  Staff Intervention Plan: Group Attendance, Collaborate with Interdisciplinary Treatment Team  Consent to Intern Participation: N/A    Victorino Sparrow, LRT/CTRS  Victorino Sparrow A 07/30/2019, 12:22 PM
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
## 2019-07-30 NOTE — BHH Suicide Risk Assessment (Signed)
[facility] INPATIENT:  Family/Significant Other Suicide Prevention Education   Suicide Prevention Education:  Patient Refusal for Family/Significant Other Suicide Prevention Education: The patient, [name], has refused to provide written consent for family/significant other to be provided Family/Significant Other Suicide Prevention Education during admission and/or prior to discharge.  Physician notified.   SPE completed with patient, as patient refused to consent to family contact. SPI pamphlet provided to pt and pt was encouraged to share information with support network, ask questions, and talk about any concerns relating to SPE. Patient denies access to guns/firearms and verbalized understanding of information provided. Mobile Crisis information also provided to patient.    Darletta Moll MSW, LCSW Clincal Social Worker  [facility]

## 2019-07-30 NOTE — BHH Group Notes (Signed)
Occupational Therapy Group Note Date: 07/30/2019 Group Topic/Focus: Health and Wellness   Group Description: This on unit group utilized diaphragmatic breathing, stretching and chair yoga to promote relaxation. Chair yoga was utilized to improve overall ability to focus and promote affect regulation. Patients were encouraged to be active in their practice and reported overall benefit from exercises.  Participation Level: Moderate   Participation Quality: Minimal Cues   Behavior: Calm, Cooperative and Guarded   Speech/Thought Process: Coherent   Affect/Mood: Constricted   Insight: Poor   Judgement: Limited   Individualization: [name] was quiet, kept to self, though engaged actively in activity. Pt completed 100% of demonstrated stretches and exercises and noted benefit post group. When asked to identify how she felt, stated "like a human lab specimen."  Modes of Intervention: Activity, Discussion and Education  Patient Response to Interventions:  Attentive and Engaged   Plan: Continue to engage patient in OT groups 2 - 3x/week.  Ponciano Ort, MOT, OTR/L

## 2019-07-30 NOTE — Progress Notes (Signed)
D:  Patient's self inventory sheet, patient sleeps good, no sleep medication. Good appetite, normal energy level, good concentration.  Denied depressed and anxiety, hopeless "being worst."  Denied withdrawals.  Checked cravings.  Denied SI.  Physical problems, headaches.  Same problems that can't be explained.  Physical pain, spiritual and physical hurt.  No pain medicine.  "All problems happening around the environment that my right become wrong.  I trust in God."  Does have discharge plans.   A:  Patient has been laying in bed resting.  Emotional support and encouragements given patient. R:  Denied SI and HI, contracts for safety.  Denied A/V hallucinations.  Safety maintained with 15 minute

## 2019-07-30 NOTE — Progress Notes (Signed)
Recreation Therapy Notes  Date: 7.29.21 Time: 1000 Location: [address] Dayroom  Group Topic: Self-Esteem  Goal Area(s) Addresses:  Patient will successfully identify positive attributes about themselves.  Patient will successfully identify benefit of improved self-esteem.   Behavioral Response: Engaged  Intervention: Blank crest, markers, colored pencils  Activity: Crest of Arms.  Patients were given a blank crest divided into four parts.  Patients were to highlight something unique about themselves in each of the sections.  Education:  Self-Esteem, Dentist.   Education Outcome: Acknowledges education/In group clarification offered/Needs additional education  Clinical Observations/Feedback: Pt was fidgeting throughout group.  Pt was focused on religious scriptures.  Some of the scriptures she focused on were John 3:16 and a scripture that focused on trusting in God.  Pt also talked about looking to yourself first and having faith in God.       Victorino Sparrow, LRT/CTRS     Ria Comment, Eloy Fehl A 07/30/2019 11:26 AM
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
## 2019-07-30 NOTE — Progress Notes (Signed)
   07/30/19 2100  Psych Admission Type (Psych Patients Only)  Admission Status Involuntary  Psychosocial Assessment  Patient Complaints Irritability  Eye Contact Brief  Facial Expression Animated;Anxious  Affect Anxious;Irritable  Speech Logical/coherent;Argumentative  Interaction Assertive;Guarded  Motor Activity Slow  Appearance/Hygiene In scrubs  Behavior Characteristics Irritable  Mood Angry;Anxious  Thought Process  Coherency WDL  Content WDL  Delusions None reported or observed  Perception WDL  Hallucination None reported or observed  Judgment Impaired  Confusion None  Danger to Self  Current suicidal ideation? Denies  Danger to Others  Danger to Others None reported or observed
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
## 2019-07-31 NOTE — Progress Notes (Signed)
SPIRITUALITY GROUP NOTE   Group Description:  Group focused on topic of hope.  Patients participated in facilitated discussion around topic, connecting with one another around experiences and definitions for hope.  Group members engaged with visual explorer photos, reflecting on what hope looks like for them today.  Group engaged in discussion around how their definitions of hope are present today in hospital.   Modalities: Psycho-social ed, Adlerian, Narrative, MI Patient Progress:  GROUP NOT HELD DUE TO CONSTRUCTION ON UNIT - DAY ROOM UNAVAILABLE.

## 2019-07-31 NOTE — Progress Notes (Signed)
Adult Psychoeducational Group Note  Date:  07/31/2019 Time:  8:33 PM  Group Topic/Focus:  Wrap-Up Group:   The focus of this group is to help patients review their daily goal of treatment and discuss progress on daily workbooks.  Participation Level:  Active  Participation Quality:  Appropriate  Affect:  Appropriate  Cognitive:  Appropriate  Insight: Appropriate  Engagement in Group:  Engaged  Modes of Intervention:  Discussion  Additional Comments:  Patient attended wrap-up group and participated.   Rani Idler W Lucyle Alumbaugh [id number], 8:33 PM

## 2019-07-31 NOTE — Progress Notes (Signed)
°   07/31/19 0759  Vital Signs  Pulse Rate (!) 106  BP (!) 132/121  BP Location Left Arm  BP Method Automatic  Patient Position (if appropriate) Standing   D: Patient presents with a anxious affect. Patient stood back from the med window when receiving meds. Patient denies, SI/ HI/AVH. Patient expressed some anger when tech stated that she was in the shower when vitals were taken.  A:  Patient took scheduled medicine.  Support and encouragement provided Routine safety checks conducted every 15 minutes. Patient  Informed to notify staff with any concerns.   R: Safety maintained.

## 2019-07-31 NOTE — Progress Notes (Signed)
   07/31/19 2157  Psych Admission Type (Psych Patients Only)  Admission Status Involuntary  Psychosocial Assessment  Patient Complaints None  Eye Contact Brief  Facial Expression Flat;Worried;Sad  Affect Preoccupied;Sad  Speech Soft  Interaction Guarded;Minimal  Appearance/Hygiene Unremarkable  Behavior Characteristics Cooperative  Mood Preoccupied;Sad  Thought Pension scheme manager thinking  Content WDL  Delusions None reported or observed  Perception WDL  Hallucination None reported or observed (May be responding but denies)  Judgment WDL  Confusion WDL  Danger to Self  Current suicidal ideation? Denies  Danger to Others  Danger to Others None reported or observed  Patient guarded and minimal with conversation. Very flat. Seems very preoccupied. Somewhat bizarre when first approaching her as she got up and spit in the trash. She was sitting there just staring in her room both times going down there. Isolating in room but did come and get medication at window. Took Depakote with some gatorade.

## 2019-07-31 NOTE — Tx Team (Cosign Needed)
Interdisciplinary Treatment and Diagnostic Plan Update  07/31/2019 Time of Session: 11:30am [name] MRN: [id number]  Principal Diagnosis: Bipolar affective disorder, manic, severe, with psychotic behavior ([place])  Secondary Diagnoses: Principal Problem:   Bipolar affective disorder, manic, severe, with psychotic behavior ([place])   Current Medications:  Current Facility-Administered Medications  Medication Dose Route Frequency Provider Last Rate Last Admin   acetaminophen (TYLENOL) tablet 650 mg  650 mg Oral Q6H PRN Connye Burkitt, NP       alum & mag hydroxide-simeth (MAALOX/MYLANTA) 200-200-20 MG/5ML suspension 30 mL  30 mL Oral Q4H PRN Connye Burkitt, NP       benztropine (COGENTIN) tablet 0.5 mg  0.5 mg Oral BID PRN Cobos, Myer Peer, MD       divalproex (DEPAKOTE) DR tablet 500 mg  500 mg Oral QHS Connye Burkitt, NP   500 mg at 07/30/19 2042   ferrous sulfate tablet 325 mg  325 mg Oral Q breakfast Connye Burkitt, NP   325 mg at 07/31/19 0756   hydrOXYzine (ATARAX/VISTARIL) tablet 25 mg  25 mg Oral TID PRN Connye Burkitt, NP       lisinopril (ZESTRIL) tablet 10 mg  10 mg Oral Daily Lindell Spar I, NP   10 mg at 07/31/19 0756   OLANZapine zydis (ZYPREXA) disintegrating tablet 5 mg  5 mg Oral Q8H PRN Connye Burkitt, NP       And   LORazepam (ATIVAN) tablet 1 mg  1 mg Oral PRN Connye Burkitt, NP       And   ziprasidone (GEODON) injection 20 mg  20 mg Intramuscular PRN Connye Burkitt, NP       OLANZapine (ZYPREXA) tablet 5 mg  5 mg Oral BID Connye Burkitt, NP   5 mg at 07/31/19 0801   temazepam (RESTORIL) capsule 15 mg  15 mg Oral QHS PRN Connye Burkitt, NP   15 mg at 07/30/19 2042   PTA Medications: Medications Prior to Admission  Medication Sig Dispense Refill Last Dose   benztropine (COGENTIN) 0.5 MG tablet Take 1 tablet (0.5 mg total) by mouth 2 (two) times daily. (Patient not taking: Reported on 07/29/2019) 60 tablet 3    divalproex (DEPAKOTE) 500 MG DR tablet  Take 1 tablet (500 mg total) by mouth at bedtime. (Patient not taking: Reported on 07/29/2019) 90 tablet 1    docusate sodium (COLACE) 100 MG capsule Take 1 capsule (100 mg total) by mouth daily with breakfast. (Patient not taking: Reported on 05/17/2018) 30 capsule 1    ferrous sulfate 325 (65 FE) MG tablet Take 1 tablet (325 mg total) by mouth daily with breakfast. (Patient not taking: Reported on 07/29/2019) 30 tablet 6    loratadine (CLARITIN) 10 MG tablet Take 1 tablet (10 mg total) by mouth daily. (Patient not taking: Reported on 07/29/2019) 90 tablet 2    paliperidone (INVEGA SUSTENNA) 234 MG/1.5ML SUSY injection Inject 234 mg into the muscle every 28 (twenty-eight) days. Due 6/15 (Patient not taking: Reported on 07/29/2019) 1.5 mL 11    risperiDONE (RISPERDAL) 3 MG tablet Take 2 tablets (6 mg total) by mouth at bedtime. (Patient not taking: Reported on 07/29/2019) 90 tablet 1    temazepam (RESTORIL) 15 MG capsule Take 1 capsule (15 mg total) by mouth at bedtime. (Patient not taking: Reported on 07/29/2019) 30 capsule 0     Patient Stressors: Marital or family conflict  Patient Strengths: Capable of independent living Religious Affiliation Supportive  family/friends  Treatment Modalities: Medication Management, Group therapy, Case management,  1 to 1 session with clinician, Psychoeducation, Recreational therapy.   Physician Treatment Plan for Primary Diagnosis: Bipolar affective disorder, manic, severe, with psychotic behavior (HCC) Long Term Goal(s): Improvement in symptoms so as ready for discharge Improvement in symptoms so as ready for discharge   Short Term Goals: Ability to identify changes in lifestyle to reduce recurrence of condition will improve Ability to verbalize feelings will improve Ability to demonstrate self-control will improve Ability to identify and develop effective coping behaviors will improve Ability to maintain clinical measurements within normal limits will  improve Compliance with prescribed medications will improve  Medication Management: Evaluate patient's response, side effects, and tolerance of medication regimen.  Therapeutic Interventions: 1 to 1 sessions, Unit Group sessions and Medication administration.  Evaluation of Outcomes: Not Met  Physician Treatment Plan for Secondary Diagnosis: Principal Problem:   Bipolar affective disorder, manic, severe, with psychotic behavior (HCC)  Long Term Goal(s): Improvement in symptoms so as ready for discharge Improvement in symptoms so as ready for discharge   Short Term Goals: Ability to identify changes in lifestyle to reduce recurrence of condition will improve Ability to verbalize feelings will improve Ability to demonstrate self-control will improve Ability to identify and develop effective coping behaviors will improve Ability to maintain clinical measurements within normal limits will improve Compliance with prescribed medications will improve     Medication Management: Evaluate patient's response, side effects, and tolerance of medication regimen.  Therapeutic Interventions: 1 to 1 sessions, Unit Group sessions and Medication administration.  Evaluation of Outcomes: Not Met   RN Treatment Plan for Primary Diagnosis: Bipolar affective disorder, manic, severe, with psychotic behavior (HCC) Long Term Goal(s): Knowledge of disease and therapeutic regimen to maintain health will improve  Short Term Goals: Ability to remain free from injury will improve, Ability to verbalize frustration and anger appropriately will improve, Ability to verbalize feelings will improve, Ability to identify and develop effective coping behaviors will improve and Compliance with prescribed medications will improve  Medication Management: RN will administer medications as ordered by provider, will assess and evaluate patient's response and provide education to patient for prescribed medication. RN will report  any adverse and/or side effects to prescribing provider.  Therapeutic Interventions: 1 on 1 counseling sessions, Psychoeducation, Medication administration, Evaluate responses to treatment, Monitor vital signs and CBGs as ordered, Perform/monitor CIWA, COWS, AIMS and Fall Risk screenings as ordered, Perform wound care treatments as ordered.  Evaluation of Outcomes: Not Met   LCSW Treatment Plan for Primary Diagnosis: Bipolar affective disorder, manic, severe, with psychotic behavior (HCC) Long Term Goal(s): Safe transition to appropriate next level of care at discharge, Engage patient in therapeutic group addressing interpersonal concerns.  Short Term Goals: Engage patient in aftercare planning with referrals and resources, Increase social support, Increase ability to appropriately verbalize feelings, Identify triggers associated with mental health/substance abuse issues and Increase skills for wellness and recovery  Therapeutic Interventions: Assess for all discharge needs, 1 to 1 time with Social worker, Explore available resources and support systems, Assess for adequacy in community support network, Educate family and significant other(s) on suicide prevention, Complete Psychosocial Assessment, Interpersonal group therapy.  Evaluation of Outcomes: Not Met   Progress in Treatment: Attending groups: Yes. Participating in groups: Yes. Taking medication as prescribed: Yes. Toleration medication: Yes. Family/Significant other contact made: No, will contact:  patient declined consents. Patient understands diagnosis: No. Discussing patient identified problems/goals with staff: Yes. Medical problems stabilized  or resolved: Yes. Denies suicidal/homicidal ideation: Yes. Issues/concerns per patient self-inventory: No.  New problem(s) identified: No, Describe:  none.  New Short Term/Long Term Goal(s): medication stabilization, elimination of SI thoughts, development of comprehensive mental  wellness plan.   Patient Goals:  "My safety and my discharge"  Discharge Plan or Barriers: Patient recently admitted. CSW will continue to follow and assess for appropriate referrals and possible discharge planning.   Reason for Continuation of Hospitalization: Delusions  Hallucinations Medication stabilization  Estimated Length of Stay: 3-5 days  Attendees: Patient: [name] 07/31/2019  Physician: Dr. Parke Poisson 07/31/2019  Nursing:  07/31/2019   RN Care Manager: 07/31/2019   Social Worker: Darletta Moll, [place] 07/31/2019   Recreational Therapist:  07/31/2019   Other:  07/31/2019   Other:  07/31/2019   Other: 07/31/2019     Scribe for Treatment Team: Vassie Moselle, LCSW 07/31/2019 1:26 PM

## 2019-08-01 LAB — IRON AND TIBC
Iron: 235 ug/dL — ABNORMAL HIGH (ref 28–170)
Saturation Ratios: 53 % — ABNORMAL HIGH (ref 10.4–31.8)
TIBC: 440 ug/dL (ref 250–450)
UIBC: 205 ug/dL

## 2019-08-01 LAB — RETICULOCYTES
Immature Retic Fract: 19.5 % — ABNORMAL HIGH (ref 2.3–15.9)
RBC.: 4.15 MIL/uL (ref 3.87–5.11)
Retic Count, Absolute: 78 10*3/uL (ref 19.0–186.0)
Retic Ct Pct: 1.9 % (ref 0.4–3.1)

## 2019-08-01 LAB — FOLATE: Folate: 9.2 ng/mL (ref >5.9)

## 2019-08-01 LAB — VITAMIN B12: Vitamin B-12: 542 pg/mL (ref 180–914)

## 2019-08-01 LAB — FERRITIN: Ferritin: 9 ng/mL — ABNORMAL LOW (ref 11–307)

## 2019-08-01 MED ORDER — DIVALPROEX SODIUM 500 MG PO DR TAB
750.0000 mg | DELAYED_RELEASE_TABLET | Freq: Every day | ORAL | Status: DC
  Administered 2019-08-01 – 2019-08-03 (×3): 750 mg via ORAL
  Filled 2019-08-01 (×6): qty 1

## 2019-08-01 MED ORDER — LISINOPRIL 20 MG PO TABS
20.0000 mg | ORAL_TABLET | Freq: Every day | ORAL | Status: DC
  Administered 2019-08-01 – 2019-08-02 (×2): 20 mg via ORAL
  Filled 2019-08-01 (×5): qty 1

## 2019-08-01 MED ORDER — OLANZAPINE 5 MG PO TBDP
5.0000 mg | ORAL_TABLET | Freq: Every day | ORAL | Status: DC
  Administered 2019-08-01 – 2019-08-04 (×4): 5 mg via ORAL
  Filled 2019-08-01 (×6): qty 1

## 2019-08-01 MED ORDER — OLANZAPINE 10 MG PO TBDP
10.0000 mg | ORAL_TABLET | Freq: Every day | ORAL | Status: DC
  Administered 2019-08-01 – 2019-08-02 (×2): 10 mg via ORAL
  Filled 2019-08-01 (×3): qty 1

## 2019-08-01 MED ORDER — TEMAZEPAM 7.5 MG PO CAPS: 7.5000 mg | ORAL_CAPSULE | Freq: Every evening | ORAL | Status: DC | PRN
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
## 2019-08-01 NOTE — Progress Notes (Signed)
   08/01/19 0607  Vital Signs  Pulse Rate 75  BP (!) 146/99  BP Location Right Arm  BP Method Automatic  Patient Position (if appropriate) Standing   D: Patient denies SI/HI/AVH. Patient denies anxiety and depression. Patient was out in open areas and was social with staff. A:  Patient took scheduled medicine.  Support and encouragement provided Routine safety checks conducted every 15 minutes. Patient  Informed to notify staff with any concerns.   R:  Safety maintained.

## 2019-08-02 LAB — HEPATIC FUNCTION PANEL
ALT: 10 U/L (ref 0–44)
AST: 19 U/L (ref 15–41)
Albumin: 3.7 g/dL (ref 3.5–5.0)
Alkaline Phosphatase: 41 U/L (ref 38–126)
Bilirubin, Direct: <0.1 mg/dL (ref 0.0–0.2)
Total Bilirubin: 0.4 mg/dL (ref 0.3–1.2)
Total Protein: 7.1 g/dL (ref 6.5–8.1)

## 2019-08-02 MED ORDER — PALIPERIDONE PALMITATE ER 234 MG/1.5ML IM SUSY
234.0000 mg | PREFILLED_SYRINGE | Freq: Once | INTRAMUSCULAR | Status: AC
  Administered 2019-08-02: 234 mg via INTRAMUSCULAR
  Filled 2019-08-02: qty 1.5

## 2019-08-02 MED ORDER — LISINOPRIL 40 MG PO TABS
40.0000 mg | ORAL_TABLET | Freq: Every day | ORAL | Status: DC
  Administered 2019-08-03 – 2019-08-04 (×2): 40 mg via ORAL
  Filled 2019-08-02 (×4): qty 1

## 2019-08-02 MED ORDER — HYDROCERIN EX CREA
TOPICAL_CREAM | Freq: Two times a day (BID) | CUTANEOUS | Status: DC
  Filled 2019-08-02: qty 113

## 2019-08-02 MED ORDER — CLONIDINE HCL 0.1 MG PO TABS
0.1000 mg | ORAL_TABLET | Freq: Three times a day (TID) | ORAL | Status: DC | PRN
  Administered 2019-08-02: 0.1 mg via ORAL
  Filled 2019-08-02: qty 1

## 2019-08-02 NOTE — Plan of Care (Signed)
Nurse discussed anxiety, depression and coping skills with patient.  

## 2019-08-02 NOTE — [facility] Group Notes (Signed)
[facility] LCSW Group Therapy Note  Date/Time:  08/02/2019  11:00AM-12:00PM  Type of Therapy and Topic:  Group Therapy:  Music and Mood  Participation Level:  Minimal   Description of Group: In this process group, members listened to a variety of genres of music and identified that different types of music evoke different responses.  Patients were encouraged to identify music that was soothing for them and music that was energizing for them.  Patients discussed how this knowledge can help with wellness and recovery in various ways including managing depression and anxiety as well as encouraging healthy sleep habits.    Therapeutic Goals: Patients will explore the impact of different varieties of music on mood Patients will verbalize the thoughts they have when listening to different types of music Patients will identify music that is soothing to them as well as music that is energizing to them Patients will discuss how to use this knowledge to assist in maintaining wellness and recovery Patients will explore the use of music as a coping skill  Summary of Patient Progress:  At the beginning of group, patient expressed that she felt "meditating, focusing on the inside and the outside."  She only talked when called on directly and then would talk about music making her feel "motions."  Her affect was flat from the beginning of group to the end.  She constantly wiped at her eyes, face, and head in an almost ritualistic manner  At the end of group, patient expressed that she felt "depressed."    Therapeutic Modalities: Solution Focused Brief Therapy Activity   Selmer Dominion, LCSW

## 2019-08-02 NOTE — Progress Notes (Signed)
D:  Patient's self inventory sheet, patient sleep good, no sleep medication given.  Good appetite, normal energy level, good concentration.  Denied depression, hopeless and anxiety.  Denied withdrawals.  Denied SI. Denied physical problems.  Goal is emotional help.  Goal is discharging.  Plans to follow up on all activities scheduled. A:  Medications administered per MD orders.  Emotional support and encouragement given patient. R:  Denied SI and HI, contracts for safety.  Denied A/V hallucinations.  Safety maintained with 15 minute checks.
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
## 2019-08-02 NOTE — Plan of Care (Signed)
Patient continues to experience disturbed thought process. Paranoid, guarded and suspicious. Stayed in bed and came out for medications. Denies suicidal thoughts.  Patient returned to bed and has been sleeping. Safety precautions maintained.

## 2019-08-02 NOTE — Progress Notes (Signed)
BP 158/90 - clonidine 0.1 mg prn given per MD order

## 2019-08-02 NOTE — Progress Notes (Signed)
   08/02/19 2333  Psych Admission Type (Psych Patients Only)  Admission Status Involuntary  Psychosocial Assessment  Patient Complaints None  Eye Contact Brief  Facial Expression Flat;Sad  Affect Irritable;Preoccupied;Sad  Speech Soft;Slow  Interaction Avoidant;Cautious;Guarded  Motor Activity Shuffling  Appearance/Hygiene Disheveled  Behavior Characteristics Anxious;Guarded  Mood Suspicious  Thought Process  Coherency Circumstantial  Content Ambivalence  Delusions Paranoid  Perception WDL  Hallucination UTA  Judgment Impaired  Confusion WDL  Danger to Self  Current suicidal ideation? Denies  Danger to Others  Danger to Others None reported or observed  D: Patient in dayroom quite but appears guarded and suspicious.   A: Medications administered as prescribed. Support and encouragement provided as needed.  R: Patient remains safe on the unit. Will continue to monitor for safety and stability.

## 2019-08-03 MED ORDER — OLANZAPINE 5 MG PO TBDP
15.0000 mg | ORAL_TABLET | Freq: Every day | ORAL | Status: DC
  Administered 2019-08-03: 15 mg via ORAL
  Filled 2019-08-03 (×4): qty 3

## 2019-08-03 NOTE — BHH Suicide Risk Assessment (Signed)
[facility] Discharge Suicide Risk Assessment   Principal Problem: Bipolar affective disorder, manic, severe, with psychotic behavior ([place]) Discharge Diagnoses: Principal Problem:   Bipolar affective disorder, manic, severe, with psychotic behavior ([place])   Total Time spent with patient: 20 minutes  Musculoskeletal: Strength & Muscle Tone: within normal limits Gait & Station: normal Patient leans: N/A  Psychiatric Specialty Exam: Review of Systems  All other systems reviewed and are negative.   Blood pressure (!) 128/93, pulse 88, temperature 97.7 F (36.5 C), temperature source Oral, resp. rate 18, height 5\' 4"  (1.626 m), weight 84.4 kg, SpO2 100 %.Body mass index is 31.93 kg/m.  General Appearance: Casual  Eye Contact::  Fair  Speech:  Normal Rate409  Volume:  Normal  Mood:  Anxious  Affect:  Flat  Thought Process:  Coherent and Descriptions of Associations: Intact  Orientation:  Full (Time, Place, and Person)  Thought Content:  Delusions  Suicidal Thoughts:  No  Homicidal Thoughts:  No  Memory:  Immediate;   Fair Recent;   Fair Remote;   Fair  Judgement:  Intact  Insight:  Fair  Psychomotor Activity:  Normal  Concentration:  Fair  Recall:  [employer] of Knowledge:Fair  Language: Good  Akathisia:  Negative  Handed:  Right  AIMS (if indicated):     Assets:  Desire for Improvement Housing Resilience  Sleep:  Number of Hours: 6.75  Cognition: WNL  ADL's:  Intact   Mental Status Per Nursing Assessment::   On Admission:  NA  Demographic Factors:  Low socioeconomic status and Unemployed  Loss Factors: NA  Historical Factors: Impulsivity  Risk Reduction Factors:   Sense of responsibility to family, Living with another person, especially a relative and Positive social support  Continued Clinical Symptoms:  Bipolar Disorder:   Mixed State  Cognitive Features That Contribute To Risk:  Thought constriction (tunnel vision)    Suicide Risk:  Minimal: No  identifiable suicidal ideation.  Patients presenting with no risk factors but with morbid ruminations; may be classified as minimal risk based on the severity of the depressive symptoms   Follow-up [place]. Go on 08/17/2019.   Specialty: Behavioral Health Why: You have an appointment for therapy on 08/17/19 at 8:00 am.  You also have an appointment for medication management on 09/03/19 at 9:30 am.  These appointments will be held in person.  Please arrive 15 minutes prior to your appointment. Contact information: [place] [phone]              Plan Of Care/Follow-up recommendations:  Activity:  ad lib  Sharma Covert, MD 08/03/2019, 2:42 PM

## 2019-08-03 NOTE — Progress Notes (Signed)
D:  Patient denied SI and HI, contracts for safety.  Denied A/V hallucinations.   A:  Medications administered per MD orders.  Emotional support and encouragement given patient. R:  Safety maintained with 15 minute checks.  
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
## 2019-08-03 NOTE — BHH Group Notes (Signed)
[place] LCSW Group Therapy  08/03/2019 2:18 PM  Type of Therapy:  Group Therapy  Participation Level:  Minimal  Participation Quality:  Appropriate  Affect:  Flat and Lethargic  Cognitive:  Lacking  Insight:  Limited  Engagement in Therapy:  Limited  Modes of Intervention:  Discussion, Education and Support  Summary of Progress/Problems: This patient provided minimal engagement during group, however engaged appropriately when she did.   Kevontae Burgoon A Gentry Pilson 08/03/2019, 2:18 PM

## 2019-08-04 MED ORDER — DIVALPROEX SODIUM 250 MG PO DR TAB: 750.0000 mg | DELAYED_RELEASE_TABLET | Freq: Every day | ORAL | 0 refills | Status: DC

## 2019-08-04 MED ORDER — FERROUS SULFATE 325 (65 FE) MG PO TABS: 325.0000 mg | ORAL_TABLET | Freq: Every day | ORAL | 0 refills | Status: DC

## 2019-08-04 MED ORDER — LISINOPRIL 40 MG PO TABS: 40.0000 mg | ORAL_TABLET | Freq: Every day | ORAL | 0 refills | Status: DC

## 2019-08-04 MED ORDER — OLANZAPINE 15 MG PO TBDP: 15.0000 mg | ORAL_TABLET | Freq: Every day | ORAL | 0 refills | Status: DC

## 2019-08-04 MED ORDER — INVEGA SUSTENNA 234 MG/1.5ML IM SUSY: 156.0000 mg | PREFILLED_SYRINGE | Freq: Once | INTRAMUSCULAR | 11 refills | Status: DC

## 2019-08-04 MED ORDER — OLANZAPINE 5 MG PO TBDP: 5.0000 mg | ORAL_TABLET | Freq: Every morning | ORAL | 0 refills | Status: DC

## 2019-08-04 NOTE — Progress Notes (Signed)
RN met with pt and reviewed pt's discharge instructions.  Pt verbalized understanding of discharge instructions and pt did not have any questions. RN reviewed and provided pt with a copy of SRA, AVS and Transition Record.  RN returned pt's belongings to pt.  Pt denied SI and HI.  Pt was appreciative of the care pt received at [facility].  Patient discharged to the lobby without incident where her safe transport ride was waiting to take pt home.

## 2019-08-04 NOTE — Progress Notes (Signed)
  [facility] Adult Case Management Discharge Plan :  Will you be returning to the same living situation after discharge:  Yes,  to home. At discharge, do you have transportation home?: No. Safe Transport will be arranged. Do you have the ability to pay for your medications: Yes,  has insurance.  Release of information consent forms completed and in the chart;  Patient's signature needed at discharge.  Patient to Follow up at:  [facility]. Go on 08/17/2019.   Specialty: Behavioral Health Why: You have an appointment for therapy on 08/17/19 at 8:00 am.  You also have an appointment for medication management on 09/03/19 at 9:30 am.  These appointments will be held in person.  Please arrive 15 minutes prior to your appointment. Contact information: [place] [phone]              Next level of care provider has access to [place] and Suicide Prevention discussed: Yes,  with patient.  Have you used any form of tobacco in the last 30 days? (Cigarettes, Smokeless Tobacco, Cigars, and/or Pipes): No  Has patient been referred to the Quitline?: N/A patient is not a smoker  Patient has been referred for addiction treatment: [place], LCSW 08/04/2019, 9:26 AM

## 2019-08-04 NOTE — Plan of Care (Signed)
  Problem: Education: Goal: Emotional status will improve Outcome: Progressing   Problem: Activity: Goal: Sleeping patterns will improve Outcome: Progressing   Problem: Coping: Goal: Ability to verbalize frustrations and anger appropriately will improve Outcome: Progressing   Problem: Health Behavior/Discharge Planning: Goal: Compliance with treatment plan for underlying cause of condition will improve Outcome: Progressing   Problem: Safety: Goal: Periods of time without injury will increase Outcome: Progressing

## 2019-08-04 NOTE — Progress Notes (Signed)
Patient presents with flat affect. Denies any SI, HI, AVH. Isolative to self. Minimal interaction with staff and peers. Medication compliant. Sleeping well this shift.  Encouragement and support provided. Safety checks maintained. Medications given as prescribed. Pt receptive and remains safe on unit with q 15 min checks.
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
## 2019-08-17 ENCOUNTER — Ambulatory Visit (HOSPITAL_COMMUNITY): Payer: Medicaid Other | Admitting: Clinical

## 2019-09-03 ENCOUNTER — Other Ambulatory Visit: Payer: Self-pay

## 2019-09-03 ENCOUNTER — Telehealth (HOSPITAL_COMMUNITY): Payer: Medicaid Other | Admitting: Psychiatry

## 2020-02-04 ENCOUNTER — Encounter (INDEPENDENT_AMBULATORY_CARE_PROVIDER_SITE_OTHER): Payer: Self-pay | Admitting: Primary Care

## 2020-02-04 ENCOUNTER — Ambulatory Visit (INDEPENDENT_AMBULATORY_CARE_PROVIDER_SITE_OTHER): Payer: Medicaid Other | Admitting: Primary Care

## 2020-02-04 ENCOUNTER — Other Ambulatory Visit: Payer: Self-pay

## 2020-02-04 VITALS — BP 170/108 | HR 92 | Ht 64.96 in | Wt 211.0 lb

## 2020-02-04 DIAGNOSIS — Z1211 Encounter for screening for malignant neoplasm of colon: Secondary | ICD-10-CM

## 2020-02-04 DIAGNOSIS — Z1231 Encounter for screening mammogram for malignant neoplasm of breast: Secondary | ICD-10-CM

## 2020-02-04 DIAGNOSIS — Z7689 Persons encountering health services in other specified circumstances: Secondary | ICD-10-CM | POA: Diagnosis not present

## 2020-02-04 DIAGNOSIS — Z79899 Other long term (current) drug therapy: Secondary | ICD-10-CM

## 2020-02-04 DIAGNOSIS — Z131 Encounter for screening for diabetes mellitus: Secondary | ICD-10-CM | POA: Diagnosis not present

## 2020-02-04 DIAGNOSIS — Z23 Encounter for immunization: Secondary | ICD-10-CM

## 2020-02-04 DIAGNOSIS — D649 Anemia, unspecified: Secondary | ICD-10-CM

## 2020-02-04 DIAGNOSIS — I10 Essential (primary) hypertension: Secondary | ICD-10-CM

## 2020-02-04 MED ORDER — BLOOD PRESSURE MONITOR DEVI: 1.0000 | Freq: Three times a day (TID) | 0 refills | Status: DC

## 2020-02-04 MED ORDER — LISINOPRIL 40 MG PO TABS: 40.0000 mg | ORAL_TABLET | Freq: Every day | ORAL | 1 refills | Status: DC

## 2020-02-04 MED ORDER — AMLODIPINE BESYLATE 10 MG PO TABS: 10.0000 mg | ORAL_TABLET | Freq: Every day | ORAL | 3 refills | Status: DC

## 2020-02-04 NOTE — Progress Notes (Signed)
Establish care
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
## 2020-02-04 NOTE — Patient Instructions (Signed)

## 2020-02-05 LAB — CBC WITH DIFFERENTIAL/PLATELET
Basophils Absolute: 0.1 10*3/uL (ref 0.0–0.2)
Basos: 2 %
EOS (ABSOLUTE): 0 10*3/uL (ref 0.0–0.4)
Eos: 1 %
Hematocrit: 35.2 % (ref 34.0–46.6)
Hemoglobin: 11.1 g/dL (ref 11.1–15.9)
Immature Grans (Abs): 0 10*3/uL (ref 0.0–0.1)
Immature Granulocytes: 0 %
Lymphocytes Absolute: 1.7 10*3/uL (ref 0.7–3.1)
Lymphs: 33 %
MCH: 26.7 pg (ref 26.6–33.0)
MCHC: 31.5 g/dL (ref 31.5–35.7)
MCV: 85 fL (ref 79–97)
Monocytes Absolute: 0.5 10*3/uL (ref 0.1–0.9)
Monocytes: 10 %
Neutrophils Absolute: 3 10*3/uL (ref 1.4–7.0)
Neutrophils: 54 %
Platelets: 343 10*3/uL (ref 150–450)
RBC: 4.15 x10E6/uL (ref 3.77–5.28)
RDW: 12.8 % (ref 11.7–15.4)
WBC: 5.3 10*3/uL (ref 3.4–10.8)

## 2020-02-05 LAB — CMP14+EGFR
ALT: 8 IU/L (ref 0–32)
AST: 28 IU/L (ref 0–40)
Albumin/Globulin Ratio: 1.5 (ref 1.2–2.2)
Albumin: 4.1 g/dL (ref 3.8–4.8)
Alkaline Phosphatase: 53 IU/L (ref 44–121)
BUN/Creatinine Ratio: 11 (ref 9–23)
BUN: 7 mg/dL (ref 6–24)
Bilirubin Total: 0.3 mg/dL (ref 0.0–1.2)
CO2: 21 mmol/L (ref 20–29)
Calcium: 9.2 mg/dL (ref 8.7–10.2)
Chloride: 105 mmol/L (ref 96–106)
Creatinine, Ser: 0.65 mg/dL (ref 0.57–1.00)
GFR calc Af Amer: 122 mL/min/{1.73_m2} (ref >59)
GFR calc non Af Amer: 106 mL/min/{1.73_m2} (ref >59)
Globulin, Total: 2.8 g/dL (ref 1.5–4.5)
Glucose: 87 mg/dL (ref 65–99)
Potassium: 3.2 mmol/L — ABNORMAL LOW (ref 3.5–5.2)
Sodium: 139 mmol/L (ref 134–144)
Total Protein: 6.9 g/dL (ref 6.0–8.5)

## 2020-02-05 LAB — HEMOGLOBIN A1C
Est. average glucose Bld gHb Est-mCnc: 103 mg/dL
Hgb A1c MFr Bld: 5.2 % (ref 4.8–5.6)

## 2020-02-08 ENCOUNTER — Other Ambulatory Visit (INDEPENDENT_AMBULATORY_CARE_PROVIDER_SITE_OTHER): Payer: Self-pay | Admitting: Primary Care

## 2020-02-08 MED ORDER — POTASSIUM CHLORIDE CRYS ER 10 MEQ PO TBCR: 10.0000 meq | EXTENDED_RELEASE_TABLET | Freq: Every day | ORAL | 1 refills | Status: DC

## 2020-03-03 ENCOUNTER — Telehealth (INDEPENDENT_AMBULATORY_CARE_PROVIDER_SITE_OTHER): Payer: Medicaid Other | Admitting: Primary Care

## 2020-03-11 ENCOUNTER — Ambulatory Visit (INDEPENDENT_AMBULATORY_CARE_PROVIDER_SITE_OTHER): Payer: Medicaid Other | Admitting: Primary Care

## 2020-03-11 ENCOUNTER — Other Ambulatory Visit: Payer: Self-pay

## 2020-03-11 ENCOUNTER — Encounter (INDEPENDENT_AMBULATORY_CARE_PROVIDER_SITE_OTHER): Payer: Self-pay | Admitting: Primary Care

## 2020-03-11 VITALS — BP 113/77 | HR 79 | Temp 97.7°F

## 2020-03-11 DIAGNOSIS — I10 Essential (primary) hypertension: Secondary | ICD-10-CM

## 2020-03-11 NOTE — Progress Notes (Signed)
[place]   [name] is a 50 year old obese female who presents for  hypertension evaluation, on previous visit medication was adjusted to include lisinopril 40 mg and amlodipine 10 mg Denies shortness of breath, headaches, chest pain or lower extremity edema, sudden onset, vision changes, unilateral weakness, dizziness, paresthesias.  Blood pressure is unremarkable 113/77 Patient reports adherence with medications.  Current Medication List Current Outpatient Medications on File Prior to Visit  Medication Sig Dispense Refill  . amLODipine (NORVASC) 10 MG tablet Take 1 tablet (10 mg total) by mouth daily. 90 tablet 3  . Blood Pressure Monitor DEVI 1 Bag by Does not apply route 3 (three) times daily. 1 each 0  . OLANZapine zydis (ZYPREXA) 15 MG disintegrating tablet Take 1 tablet (15 mg total) by mouth at bedtime. 30 tablet 0  . potassium chloride SA (KLOR-CON) 10 MEQ tablet Take 1 tablet (10 mEq total) by mouth daily. 60 tablet 1  . divalproex (DEPAKOTE) 250 MG DR tablet Take 3 tablets (750 mg total) by mouth at bedtime. (Patient not taking: No sig reported) 90 tablet 0  . ferrous sulfate 325 (65 FE) MG tablet Take 1 tablet (325 mg total) by mouth daily with breakfast. (Patient not taking: No sig reported) 30 tablet 0  . OLANZapine zydis (ZYPREXA) 5 MG disintegrating tablet Take 1 tablet (5 mg total) by mouth in the morning. (Patient not taking: No sig reported) 30 tablet 0  . paliperidone (INVEGA SUSTENNA) 234 MG/1.5ML SUSY injection Inject 156 mg into the muscle once for 1 dose. Due 08/10/19 1.5 mL 11   No current facility-administered medications on file prior to visit.   Past Medical History  History reviewed. No pertinent past medical history. Dietary habits include: monitoring sodium intake  Exercise habits include:walking Family / Social history: no  ASCVD risk factors include- [name]  O:  Physical Exam Vitals reviewed.  Constitutional:       Appearance: She is obese.  HENT:     Head: Normocephalic.     Right Ear: External ear normal.     Left Ear: External ear normal.     Nose: Nose normal.  Eyes:     Extraocular Movements: Extraocular movements intact.  Pulmonary:     Effort: Pulmonary effort is normal.     Breath sounds: Normal breath sounds.  Abdominal:     General: Bowel sounds are normal. There is distension.  Musculoskeletal:        General: Normal range of motion.  Skin:    General: Skin is warm and dry.  Neurological:     Mental Status: She is alert and oriented to person, place, and time.  Psychiatric:        Mood and Affect: Mood normal.        Behavior: Behavior normal.        Thought Content: Thought content normal.        Judgment: Judgment normal.      Review of Systems  All other systems reviewed and are negative.   Last 3 Office BP readings: BP Readings from Last 3 Encounters:  03/11/20 113/77  02/04/20 (!) 170/108  07/29/19 119/68    BMET    Component Value Date/Time   NA 139 02/04/2020 1520   K 3.2 (L) 02/04/2020 1520   CL 105 02/04/2020 1520   CO2 21 02/04/2020 1520   GLUCOSE 87 02/04/2020 1520   GLUCOSE 98 07/28/2019 1528   BUN 7 02/04/2020 1520   CREATININE 0.65 02/04/2020  1520   CALCIUM 9.2 02/04/2020 1520   GFRNONAA 106 02/04/2020 1520   GFRAA 122 02/04/2020 1520    Renal function: CrCl cannot be calculated (Patient's most recent lab result is older than the maximum 21 days allowed.).  Clinical ASCVD: Yes  The 10-year ASCVD risk score Vanessa Bussing DC Jr., et al., 2013) is: 1.3%   Values used to calculate the score:     Age: 73 years     Sex: Female     Is Non-Hispanic African American: Yes     Diabetic: No     Tobacco smoker: No     Systolic Blood Pressure: 053 mmHg     Is BP treated: Yes     HDL Cholesterol: 54 mg/dL     Total Cholesterol: 159 mg/dL   A/P: [name] was seen today for blood pressure check.  Diagnoses and all orders for this visit:  Essential  hypertension Hypertension longstanding amlodipine 10mg   on current medications. Stopped lisinopril BP Goal = 130/80 mmHg. Patient is adherent with current medications.  -Continued  -F/u labs ordered - none -Counseled on lifestyle modifications for blood pressure control including reduced dietary sodium, increased exercise, adequate sleep  [name]

## 2020-04-11 ENCOUNTER — Ambulatory Visit (HOSPITAL_COMMUNITY)
Admission: EM | Admit: 2020-04-11 | Discharge: 2020-04-11 | Disposition: A | Discharge status: Other Acute Inpatient Hospital | Payer: Medicaid Other | Source: Home / Self Care

## 2020-04-11 ENCOUNTER — Encounter (HOSPITAL_COMMUNITY): Payer: Self-pay | Admitting: Emergency Medicine

## 2020-04-11 ENCOUNTER — Encounter (HOSPITAL_COMMUNITY): Payer: Self-pay | Admitting: Registered Nurse

## 2020-04-11 ENCOUNTER — Other Ambulatory Visit: Payer: Self-pay

## 2020-04-11 ENCOUNTER — Inpatient Hospital Stay (HOSPITAL_COMMUNITY)
Admission: EM | Admit: 2020-04-11 | Discharge: 2020-04-15 | DRG: 918 | Disposition: A | Discharge status: Other Acute Inpatient Hospital | Payer: Medicaid Other | Attending: Student | Admitting: Student

## 2020-04-11 DIAGNOSIS — F209 Schizophrenia, unspecified: Secondary | ICD-10-CM | POA: Insufficient documentation

## 2020-04-11 DIAGNOSIS — Z825 Family history of asthma and other chronic lower respiratory diseases: Secondary | ICD-10-CM

## 2020-04-11 DIAGNOSIS — R45851 Suicidal ideations: Secondary | ICD-10-CM

## 2020-04-11 DIAGNOSIS — Z9114 Patient's other noncompliance with medication regimen: Secondary | ICD-10-CM

## 2020-04-11 DIAGNOSIS — Y929 Unspecified place or not applicable: Secondary | ICD-10-CM

## 2020-04-11 DIAGNOSIS — E669 Obesity, unspecified: Secondary | ICD-10-CM | POA: Diagnosis present

## 2020-04-11 DIAGNOSIS — T518X2A Toxic effect of other alcohols, intentional self-harm, initial encounter: Secondary | ICD-10-CM | POA: Diagnosis present

## 2020-04-11 DIAGNOSIS — T5492XA Toxic effect of unspecified corrosive substance, intentional self-harm, initial encounter: Principal | ICD-10-CM | POA: Diagnosis present

## 2020-04-11 DIAGNOSIS — Z20822 Contact with and (suspected) exposure to covid-19: Secondary | ICD-10-CM | POA: Diagnosis present

## 2020-04-11 DIAGNOSIS — F3164 Bipolar disorder, current episode mixed, severe, with psychotic features: Secondary | ICD-10-CM

## 2020-04-11 DIAGNOSIS — T6592XA Toxic effect of unspecified substance, intentional self-harm, initial encounter: Secondary | ICD-10-CM

## 2020-04-11 DIAGNOSIS — T1491XA Suicide attempt, initial encounter: Secondary | ICD-10-CM | POA: Diagnosis present

## 2020-04-11 DIAGNOSIS — Z8249 Family history of ischemic heart disease and other diseases of the circulatory system: Secondary | ICD-10-CM

## 2020-04-11 DIAGNOSIS — E876 Hypokalemia: Secondary | ICD-10-CM | POA: Diagnosis present

## 2020-04-11 DIAGNOSIS — D509 Iron deficiency anemia, unspecified: Secondary | ICD-10-CM | POA: Diagnosis present

## 2020-04-11 DIAGNOSIS — Z833 Family history of diabetes mellitus: Secondary | ICD-10-CM

## 2020-04-11 DIAGNOSIS — Z9151 Personal history of suicidal behavior: Secondary | ICD-10-CM | POA: Insufficient documentation

## 2020-04-11 DIAGNOSIS — Z79899 Other long term (current) drug therapy: Secondary | ICD-10-CM

## 2020-04-11 DIAGNOSIS — I10 Essential (primary) hypertension: Secondary | ICD-10-CM | POA: Diagnosis not present

## 2020-04-11 DIAGNOSIS — Z6833 Body mass index (BMI) 33.0-33.9, adult: Secondary | ICD-10-CM

## 2020-04-11 DIAGNOSIS — T6591XA Toxic effect of unspecified substance, accidental (unintentional), initial encounter: Secondary | ICD-10-CM | POA: Diagnosis not present

## 2020-04-11 DIAGNOSIS — F3010 Manic episode without psychotic symptoms, unspecified: Secondary | ICD-10-CM

## 2020-04-11 DIAGNOSIS — F600 Paranoid personality disorder: Secondary | ICD-10-CM | POA: Diagnosis present

## 2020-04-11 DIAGNOSIS — Z91040 Latex allergy status: Secondary | ICD-10-CM

## 2020-04-11 DIAGNOSIS — Z888 Allergy status to other drugs, medicaments and biological substances status: Secondary | ICD-10-CM

## 2020-04-11 DIAGNOSIS — E872 Acidosis: Secondary | ICD-10-CM | POA: Diagnosis present

## 2020-04-11 HISTORY — DX: Essential (primary) hypertension: I10

## 2020-04-11 LAB — SALICYLATE LEVEL: Salicylate Lvl: <7 mg/dL — ABNORMAL LOW (ref 7.0–30.0)

## 2020-04-11 LAB — CBC
HCT: 34.8 % — ABNORMAL LOW (ref 36.0–46.0)
Hemoglobin: 10.7 g/dL — ABNORMAL LOW (ref 12.0–15.0)
MCH: 27 pg (ref 26.0–34.0)
MCHC: 30.7 g/dL (ref 30.0–36.0)
MCV: 87.7 fL (ref 80.0–100.0)
Platelets: 322 10*3/uL (ref 150–400)
RBC: 3.97 MIL/uL (ref 3.87–5.11)
RDW: 14 % (ref 11.5–15.5)
WBC: 5.3 10*3/uL (ref 4.0–10.5)
nRBC: 0 % (ref 0.0–0.2)

## 2020-04-11 LAB — ETHANOL: Alcohol, Ethyl (B): <10 mg/dL (ref <10)

## 2020-04-11 LAB — BLOOD GAS, VENOUS
Acid-base deficit: 1.4 mmol/L (ref 0.0–2.0)
Bicarbonate: 23.6 mmol/L (ref 20.0–28.0)
O2 Saturation: 65.3 %
Patient temperature: 98.6
pCO2, Ven: 43.4 mmHg — ABNORMAL LOW (ref 44.0–60.0)
pH, Ven: 7.355 (ref 7.250–7.430)
pO2, Ven: 39.3 mmHg (ref 32.0–45.0)

## 2020-04-11 LAB — COMPREHENSIVE METABOLIC PANEL
ALT: 11 U/L (ref 0–44)
AST: 29 U/L (ref 15–41)
Albumin: 3.9 g/dL (ref 3.5–5.0)
Alkaline Phosphatase: 46 U/L (ref 38–126)
Anion gap: 11 (ref 5–15)
BUN: 9 mg/dL (ref 6–20)
CO2: 20 mmol/L — ABNORMAL LOW (ref 22–32)
Calcium: 8.9 mg/dL (ref 8.9–10.3)
Chloride: 109 mmol/L (ref 98–111)
Creatinine, Ser: 0.54 mg/dL (ref 0.44–1.00)
GFR, Estimated: >60 mL/min (ref >60)
Glucose, Bld: 84 mg/dL (ref 70–99)
Potassium: 3.1 mmol/L — ABNORMAL LOW (ref 3.5–5.1)
Sodium: 140 mmol/L (ref 135–145)
Total Bilirubin: 0.6 mg/dL (ref 0.3–1.2)
Total Protein: 7.5 g/dL (ref 6.5–8.1)

## 2020-04-11 LAB — MAGNESIUM: Magnesium: 2 mg/dL (ref 1.7–2.4)

## 2020-04-11 LAB — LACTIC ACID, PLASMA: Lactic Acid, Venous: 0.9 mmol/L (ref 0.5–1.9)

## 2020-04-11 LAB — I-STAT BETA HCG BLOOD, ED (MC, WL, AP ONLY): I-stat hCG, quantitative: <5 m[IU]/mL (ref <5)

## 2020-04-11 LAB — ACETAMINOPHEN LEVEL: Acetaminophen (Tylenol), Serum: <10 ug/mL — ABNORMAL LOW (ref 10–30)

## 2020-04-11 MED ORDER — POTASSIUM CHLORIDE CRYS ER 20 MEQ PO TBCR
40.0000 meq | EXTENDED_RELEASE_TABLET | Freq: Once | ORAL | Status: AC
  Administered 2020-04-12: 40 meq via ORAL
  Filled 2020-04-11: qty 2

## 2020-04-11 MED ORDER — PYRIDOXINE HCL 100 MG/ML IJ SOLN: 100.0000 mg | Freq: Once | INTRAMUSCULAR | Status: DC

## 2020-04-11 MED ORDER — SODIUM CHLORIDE 0.9 % IV SOLN
15.0000 mg/kg | Freq: Once | INTRAVENOUS | Status: AC
  Administered 2020-04-11: 1440 mg via INTRAVENOUS
  Filled 2020-04-11: qty 1.44

## 2020-04-11 MED ORDER — ONDANSETRON HCL 4 MG PO TABS
4.0000 mg | ORAL_TABLET | Freq: Once | ORAL | Status: AC
  Administered 2020-04-11: 4 mg via ORAL
  Filled 2020-04-11: qty 1

## 2020-04-11 MED ORDER — THIAMINE HCL 100 MG/ML IJ SOLN
100.0000 mg | Freq: Once | INTRAMUSCULAR | Status: AC
  Administered 2020-04-11: 100 mg via INTRAVENOUS
  Filled 2020-04-11: qty 2

## 2020-04-11 MED ORDER — LORAZEPAM 1 MG PO TABS
1.0000 mg | ORAL_TABLET | Freq: Once | ORAL | Status: AC
  Administered 2020-04-11: 1 mg via ORAL
  Filled 2020-04-11: qty 1

## 2020-04-11 MED ORDER — SODIUM CHLORIDE 0.9 % IV SOLN
10.0000 mg/kg | Freq: Two times a day (BID) | INTRAVENOUS | Status: DC
  Administered 2020-04-12: 960 mg via INTRAVENOUS
  Filled 2020-04-11: qty 0.96

## 2020-04-11 MED ORDER — HYDRALAZINE HCL 20 MG/ML IJ SOLN: 5.0000 mg | Freq: Four times a day (QID) | INTRAMUSCULAR | Status: DC | PRN

## 2020-04-11 MED ORDER — PYRIDOXINE HCL 100 MG/ML IJ SOLN
Freq: Once | INTRAVENOUS | Status: AC
  Filled 2020-04-11: qty 100

## 2020-04-11 NOTE — BHH Counselor (Signed)
Sister in law: Rolland Bimler [phone]  Sister/legal guardian  [phone]

## 2020-04-11 NOTE — ED Triage Notes (Signed)
The patient presents from [facility] via EMS. She was IVC 'd there for bipolar and schizophrenia. Her family reports the patient drinking bleach and antifreeze. When EMS asked the patient about this, the patient became very agitated and denied the claims.    EMS vitals: 150/82 74 HR 98% SPO2 on room air

## 2020-04-11 NOTE — ED Provider Notes (Signed)
Behavioral Health Urgent Care Medical Screening Exam  Patient Name: [name] MRN: [id number] Date of Evaluation: 04/11/20 Chief Complaint: Chief Complaint/Presenting Problem: Patient appears psychotic and is agitated upon arrival to [facility]. Diagnosis:  Final diagnoses:  Bipolar affective disorder, mixed, severe, with psychotic behavior ([place])    History of Present illness: [name] is a 50 y.o. female who presented to [facility] under IVC petitioned by her sister with complaints of (per IVC): "respondent attempted suicide by trying to drink bleach and anti-freeze. She also tried to give bleach to her children to drink. Is hostile and aggressive towards others. Believes her deceased mother is still alive and claims she sees people who are not there. Is not taking her prescribed medications. Is not eating or tending to personal hygiene. Has been committed before. Is a danger to herself and others."   [name], 50 y.o., female patient seen face to face by this provider, consulted with Dr. Serafina Mitchell; and chart reviewed on 04/11/20.  On evaluation [name] reports she was brought in for medical exam.  Patient denies prior psychiatric history and at first denied any medical issues but the stated she has high blood pressure.  Patient states she is not prescribed any medications.  Patient states that she lives in [place] with her 3 children. Patient denies suicidal/homicidal ideation, psychosis, and paranoia. Patient family is present and guardianship was granted to her sister today who reports that patient has been paranoid boarding her self up in home and has been drinking bleach, and antifreeze at times and has even attempted to give to youngest child because she thought it was okay to drink.  States that patient has taken no medications since he last psychiatric admission 08/2019.  States they have concerns for patients' safety related to her paranoia.  Family is unsure if patient  has been drinking any substance that could be harmful to her. During evaluation [name] is sitting upright in chair in no acute distress.  She is alert, oriented x self and place.  She remained calm and cooperative throughout assessment.  Her mood anxious and guarded with congruent affect.  She does not appear to be responding to internal/external stimuli or delusional thoughts at this time but patient does appear to be paranoid, guarded, and restless.  Patient denies suicidal/self-harm/homicidal ideation, psychosis, and paranoia.  Patient answered question appropriately.  Will send patient to ED for medical clearance and recommend psychiatric inpatient treatment.     Psychiatric Specialty Exam  Presentation  General Appearance:Casual  Eye Contact:Good  Speech:Pressured  Speech Volume:Normal  Handedness:Right   Mood and Affect  Mood:Anxious; Labile  Affect:Labile   Thought Process  Thought Processes:Disorganized  Descriptions of Associations:Circumstantial  Orientation:Partial  Thought Content:Paranoid Ideation  Diagnosis of Schizophrenia or Schizoaffective disorder in past: Yes  Duration of Psychotic Symptoms: Greater than six months  Hallucinations:None (Patient denies)  Ideas of Reference:None  Suicidal Thoughts:No  Homicidal Thoughts:No   Sensorium  Memory:Immediate Fair; Recent Fair  Judgment:Impaired  Insight:Lacking   Executive Functions  Concentration:Fair  Attention Span:Fair  [place]; Housing; Resilience; Social Support   Sleep  Sleep:Fair  Number of hours: No data recorded  Nutritional Assessment (For OBS and FBC admissions only) Has the patient had a weight loss or gain of 10 pounds or more in the last 3 months?: No Has the patient had a decrease in food intake/or appetite?:  No  Does the patient have dental problems?: No Does the patient have eating habits or behaviors that may be indicators of an eating disorder including binging or inducing vomiting?: No Has the patient recently lost weight without trying?: No Has the patient been eating poorly because of a decreased appetite?: No Malnutrition Screening Tool Score: 0    Physical Exam: Physical Exam Vitals and nursing note reviewed. Exam conducted with a chaperone present.  Constitutional:      General: She is not in acute distress.    Appearance: Normal appearance. She is not ill-appearing.  HENT:     Head: Normocephalic.  Cardiovascular:     Rate and Rhythm: Tachycardia present.     Comments: Elevated blood pressure  Pulmonary:     Effort: Pulmonary effort is normal.  Musculoskeletal:        General: Normal range of motion.     Cervical back: Normal range of motion.  Skin:    General: Skin is warm and dry.  Neurological:     General: No focal deficit present.     Mental Status: She is alert.  Psychiatric:        Attention and Perception: She is inattentive.        Mood and Affect: Mood is anxious. Affect is labile.        Speech: Speech is rapid and pressured and tangential.        Behavior: Behavior is cooperative.        Thought Content: Thought content is paranoid. Thought content does not include homicidal or suicidal ideation.        Judgment: Judgment is impulsive.    Review of Systems  Cardiovascular:       Reports history of hypertension but states she is not taking any medications  Psychiatric/Behavioral: Positive for depression, hallucinations and suicidal ideas. The patient is nervous/anxious and has insomnia.        Patient denies all but family reports patient having suicidal ideation, auditory hallucinations, and paranoia and non compliance whit medications.    Blood pressure (!) 160/106, pulse (!) 110, temperature 98.7 F (37.1 C), temperature source Oral, resp. rate 18,  SpO2 100 %. There is no height or weight on file to calculate BMI.  Musculoskeletal: Strength & Muscle Tone: within normal limits Gait & Station: shuffle Patient leans: N/A   [facility] MSE Discharge Disposition for Follow up and Recommendations: Based on my evaluation the patient appears to have an emergency medical condition for which I recommend the patient be transferred to the emergency department for further evaluation.  Once medically cleared patient recommend for inpatient psychiatric treatment.    Spoke with Dr. Fredia Sorrow via tele phone informed sending patient to ED for medical clearance.  Also informed would send secure message with patient attached for his review.    Secure message:  Dr. Rogene Houston this is the patient I spoke with you about that I'm sending to ED for medical clearance. Recommending psychiatric inpatient treatment once medically cleared.      Iban Utz, NP 04/11/2020, 7:06 PM

## 2020-04-11 NOTE — H&P (Addendum)
History and Physical    [name] HCW:[id number] DOB: Sep 19, 1970 DOA: 04/11/2020  PCP: Pcp, No  Patient coming from: [place] center  I have personally briefly reviewed patient's old medical records in [place]  Chief Complaint: "attempted suicide by drinking bleach and anti-freeze"  HPI: [name] is a 50 y.o. female with medical history significant for bipolar disorder with psychosis, HTN who presents as an IVC from [facility].   Pt does not answer questions/selectively mute during evaluation although she does nod or shake her head at yes/no questions. No family at bedside.  Shakes her head when asked about pain and whether she ingested chemicals.  Pt was brought to behavior health urgent care by family and per chart review, IVC was petitioned by sister who reports that patient "respondent attempted suicide by trying to drink bleach and anti-freeze. She also tried to give bleach to her children to drink. Is hostile and aggressive towards others. Believes her deceased mother is still alive and claims she sees people who are not there. Is not taking her prescribed medications. Is not eating or tending to personal hygiene. Has been committed before. Is a danger to herself and others.""respondent attempted suicide by trying to drink bleach and anti-freeze. She also tried to give bleach to her children to drink. Is hostile and aggressive towards others. Believes her deceased mother is still alive and claims she sees people who are not there. Is not taking her prescribed medications. Is not eating or tending to personal hygiene. Has been committed before. Is a danger to herself and others."  Patient has had numerous psychiatry  hospitalization in the past. Last one in 08/2019 for bipolar disorder with aggression and paranoia. Has not taken medication since.    ED Course: She was afebrile, hypertension up to systolic 176 but  noted to be restless during BP checks.  K of 3.1. No anion gap. No leukocytosis. Hgb of 10.7. Lact of 0.9. EKG with NSR.   ED PA discuss with poison control and recommended Fomepizole infusion.   Review of Systems: Unable to obtain since pt not talking   History reviewed. No pertinent past medical history.  Past Surgical History:  Procedure Laterality Date  . CESAREAN SECTION  2002, 2009     reports that she has never smoked. She has never used smokeless tobacco. She reports that she does not drink alcohol and does not use drugs. Social History  Allergies  Allergen Reactions  . Latex   . Risperdal [Risperidone] Other (See Comments)    Caused eyelids to relax requiring treatment    Family History  Problem Relation Age of Onset  . Breast cancer Mother        Deceased, 44  . Hypertension Mother   . Arthritis Father   . Hypertension Father   . Healthy Son   . Asthma Daughter   . Diabetes type II Daughter      Prior to Admission medications   Medication Sig Start Date End Date Taking? Authorizing Provider  amLODipine (NORVASC) 10 MG tablet Take 1 tablet (10 mg total) by mouth daily. 02/04/20   [name], NP  Blood Pressure Monitor DEVI 1 Bag by Does not apply route 3 (three) times daily. 02/04/20   [name], NP  divalproex (DEPAKOTE) 250 MG DR tablet Take 3 tablets (750 mg total) by mouth at bedtime. Patient not taking: No sig reported 08/04/19   Connye Burkitt, NP  ferrous sulfate 325 (65 FE) MG tablet Take 1 tablet (325 mg total) by mouth daily with breakfast. Patient not taking: No sig reported 08/04/19   Connye Burkitt, NP  OLANZapine zydis (ZYPREXA) 15 MG disintegrating tablet Take 1 tablet (15 mg total) by mouth at bedtime. 08/04/19   Connye Burkitt, NP  OLANZapine zydis (ZYPREXA) 5 MG disintegrating tablet Take 1 tablet (5 mg total) by mouth in the morning. Patient not taking: No sig reported 08/04/19   Connye Burkitt, NP  paliperidone (INVEGA SUSTENNA) 234  MG/1.5ML SUSY injection Inject 156 mg into the muscle once for 1 dose. Due 08/10/19 08/04/19 08/04/19  Connye Burkitt, NP  potassium chloride SA (KLOR-CON) 10 MEQ tablet Take 1 tablet (10 mEq total) by mouth daily. 02/08/20   [name], NP    Physical Exam: Vitals:   04/11/20 2048 04/11/20 2115  BP: (!) 185/115 (!) 186/112  Pulse: 67 63  Resp: 18   Temp: 98 F (36.7 C)   TempSrc: Oral   SpO2: 100% 100%    Constitutional: NAD, calm, comfortable, middle-age female sitting upright in triage chair Vitals:   04/11/20 2048 04/11/20 2115  BP: (!) 185/115 (!) 186/112  Pulse: 67 63  Resp: 18   Temp: 98 F (36.7 C)   TempSrc: Oral   SpO2: 100% 100%   Eyes: PERRL, lids and conjunctivae normal ENMT: Dry lips  Neck: normal, supple Respiratory: clear to auscultation bilaterally, no wheezing, no crackles. Normal respiratory effort. No accessory muscle use.  Cardiovascular: Regular rate and rhythm, no murmurs / rubs / gallops. No extremity edema.  Abdomen: no tenderness, no masses palpated. Bowel sounds positive.  Musculoskeletal: no clubbing / cyanosis. No joint deformity upper and lower extremities. Good ROM, no contractures. Normal muscle tone.  Skin: no rashes, lesions, ulcers. No induration Neurologic: Alert and able to move all extremities, CN 2-12 grossly intact. Sensation intact.Nod and shakes head in response to yes/no questions. Moved forward in seat during heart and lung exam.  Psychiatric: Quiet and flat affect. Does not make eye contact.     Labs on Admission: I have personally reviewed following labs and imaging studies  CBC: Recent Labs  Lab 04/11/20 2104  WBC 5.3  HGB 10.7*  HCT 34.8*  MCV 87.7  PLT 024   Basic Metabolic Panel: Recent Labs  Lab 04/11/20 2104  NA 140  K 3.1*  CL 109  CO2 20*  GLUCOSE 84  BUN 9  CREATININE 0.54  CALCIUM 8.9   GFR: CrCl cannot be calculated (Unknown ideal weight.). Liver Function Tests: Recent Labs  Lab  04/11/20 2104  AST 29  ALT 11  ALKPHOS 46  BILITOT 0.6  PROT 7.5  ALBUMIN 3.9   No results for input(s): LIPASE, AMYLASE in the last 168 hours. No results for input(s): AMMONIA in the last 168 hours. Coagulation Profile: No results for input(s): INR, PROTIME in the last 168 hours. Cardiac Enzymes: No results for input(s): CKTOTAL, CKMB, CKMBINDEX, TROPONINI in the last 168 hours. BNP (last 3 results) No results for input(s): PROBNP in the last 8760 hours. HbA1C: No results for input(s): HGBA1C in the last 72 hours. CBG: No results for input(s): GLUCAP in the last 168 hours. Lipid Profile: No results for input(s): CHOL, HDL, LDLCALC, TRIG, CHOLHDL, LDLDIRECT in the last 72 hours. Thyroid Function Tests: No results for input(s): TSH, T4TOTAL, FREET4, T3FREE, THYROIDAB in the last 72 hours. Anemia Panel: No results for input(s): VITAMINB12, FOLATE, FERRITIN, TIBC,  IRON, RETICCTPCT in the last 72 hours. Urine analysis:    Component Value Date/Time   COLORURINE YELLOW 03/22/2007 2217   APPEARANCEUR CLOUDY (A) 03/22/2007 2217   LABSPEC 1.015 03/22/2007 2217   PHURINE 8.0 03/22/2007 2217   GLUCOSEU NEGATIVE 03/22/2007 2217   HGBUR MODERATE (A) 03/22/2007 2217   [place] NEGATIVE 03/22/2007 2217   [place] NEGATIVE 03/22/2007 2217   PROTEINUR NEGATIVE 03/22/2007 2217   UROBILINOGEN 0.2 03/22/2007 2217   NITRITE NEGATIVE 03/22/2007 2217   LEUKOCYTESUR NEGATIVE 03/22/2007 2217    Radiological Exams on Admission: No results found.    Assessment/Plan Intentional ingestion of bleach and ethylene glycol with hx of bipolar mania episodes Admit under IVC with sitter  EKG reassuring with no QT prolongation Poison control contacted by ED PA- fomepizole 15mg /kg once and then at 10mg /kg q12hrs.  Also started thiamine, B6 Obtain VBG- no anion gap on BMP Obtain ethylene glycol, acetone, ethanol, isopropyl propylene, methanol Obtain acetaminophen and salicylate level Obtain  UDS Need psychiatry consult in the morning  HTN Readings may not be accurate since patient noted to be restless during BP checks PRN IV Hydralazine for systolic greater than 774  Hypokalemia Replete with oral K.  Check magnesium.  DVT prophylaxis:.SCDs Code Status: Full Family Communication: No family at bedside  disposition Plan: Home with observation Consults called:  Admission status: Observation    Level of care: Progressive  Status is: Observation  The patient remains OBS appropriate and will d/c before 2 midnights.  Dispo: The patient is from: Home              Anticipated d/c is to: Home              Patient currently is not medically stable to d/c.   Difficult to place patient No         Orene Desanctis DO [facility]   If 7PM-7AM, please contact night-coverage [email or web address]   04/11/2020, 10:17 PM

## 2020-04-11 NOTE — BHH Counselor (Signed)
TTS triage: Patient presents to [facility] via [facility] under IVC. She appears highly anxious. She denies SI/HI but states "there is suicide all around me. There is so much around me. It's hard to swallow." Patient denies any substance use or physical complaints.  Patient is urgent.

## 2020-04-11 NOTE — ED Provider Notes (Signed)
[facility] DEPT Provider Note   CSN: [id number] Arrival date & time: 04/11/20  2037     History Chief Complaint  Patient presents with  . ivc  . Medical Clearance  . Ingestion    [name] is a 50 y.o. female with PMH of bipolar disorder that presents to the ED under IVC by family members for bipolar episode with possible overdose. Per EMS report pt was at [place], nurse reported that family stated they were going to IVC patient due overdose. They state that she took bleach and antifreeze in attempt for suicidal overdose. Unsure what time or when . They also state that she was giving  bleach to her kids and has not been taking her psychiatric mediations. Pt denies this to EMS and according to EMS became extremely agitated when they asked her if she did this. Pt level 5 caveat due to agitation.   HPI     History reviewed. No pertinent past medical history.  Patient Active Problem List   Diagnosis Date Noted  . Ingestion of toxic substance 04/11/2020  . Bipolar affective disorder, manic, severe, with psychotic behavior ([place]) 07/29/2019  . Bipolar affective disorder, current episode manic ([place]) 05/18/2018  . Bipolar affective disorder, current episode manic with psychotic symptoms ([place]) 05/18/2018  . Uterine fibroid 02/06/2017  . Bipolar affective disorder, mixed, severe, with psychotic behavior ([place]) 12/28/2016  . Bipolar I disorder, current or most recent episode manic, with psychotic features ([place]) 12/28/2016  . 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 CESAREAN SECTION  2002, 2009     OB History    Gravida  3   Para  3   Term  3   Preterm      AB      Living        SAB      IAB      Ectopic      Multiple      Live Births  3           Family History  Problem Relation Age of Onset  .  Breast cancer Mother        Deceased, 11  . Hypertension Mother   . Arthritis Father   . Hypertension Father   . Healthy Son   . Asthma Daughter   . Diabetes type II Daughter     Social History   Tobacco Use  . Smoking status: Never Smoker  . Smokeless tobacco: Never Used  Vaping Use  . Vaping Use: Never used  Substance Use Topics  . Alcohol use: No  . Drug use: No    Home Medications Prior to Admission medications   Medication Sig Start Date End Date Taking? Authorizing Provider  amLODipine (NORVASC) 10 MG tablet Take 1 tablet (10 mg total) by mouth daily. 02/04/20   [name], NP  Blood Pressure Monitor DEVI 1 Bag by Does not apply route 3 (three) times daily. 02/04/20   [name], NP  divalproex (DEPAKOTE) 250 MG DR tablet Take 3 tablets (750 mg total) by mouth at bedtime. Patient not taking: No sig reported 08/04/19   Connye Burkitt, NP  ferrous sulfate 325 (65 FE) MG tablet Take 1 tablet (325 mg total) by mouth daily with breakfast. Patient not taking: No sig reported 08/04/19   Connye Burkitt, NP  OLANZapine zydis (  ZYPREXA) 15 MG disintegrating tablet Take 1 tablet (15 mg total) by mouth at bedtime. 08/04/19   Connye Burkitt, NP  OLANZapine zydis (ZYPREXA) 5 MG disintegrating tablet Take 1 tablet (5 mg total) by mouth in the morning. Patient not taking: No sig reported 08/04/19   Connye Burkitt, NP  paliperidone (INVEGA SUSTENNA) 234 MG/1.5ML SUSY injection Inject 156 mg into the muscle once for 1 dose. Due 08/10/19 08/04/19 08/04/19  Connye Burkitt, NP  potassium chloride SA (KLOR-CON) 10 MEQ tablet Take 1 tablet (10 mEq total) by mouth daily. 02/08/20   [name], NP    Allergies    Latex and Risperdal [risperidone]  Review of Systems   Review of Systems  Unable to perform ROS: Acuity of condition    Physical Exam Updated Vital Signs BP (!) 186/112   Pulse 63   Temp 98 F (36.7 C) (Oral)   Resp 18   SpO2 100%   Physical Exam Constitutional:       General: She is in acute distress.     Appearance: Normal appearance. She is not ill-appearing, toxic-appearing or diaphoretic.     Comments: Pt is pacing back and forth, seems distraught. Will not speak to me. No respiratory distress. Airway intact.   HENT:     Head: Normocephalic and atraumatic.     Mouth/Throat:     Mouth: Mucous membranes are moist.     Pharynx: Oropharynx is clear.     Comments: No burns in her mouth  Eyes:     Extraocular Movements: Extraocular movements intact.     Pupils: Pupils are equal, round, and reactive to light.  Cardiovascular:     Rate and Rhythm: Normal rate and regular rhythm.     Pulses: Normal pulses.  Pulmonary:     Effort: Pulmonary effort is normal.     Breath sounds: Normal breath sounds.  Musculoskeletal:        General: Normal range of motion.     Cervical back: Normal range of motion. No rigidity.  Skin:    General: Skin is warm and dry.     Capillary Refill: Capillary refill takes less than 2 seconds.  Neurological:     General: No focal deficit present.     Mental Status: She is alert.  Psychiatric:     Comments: Pt is extremley agitated, pacing back and forth in room. Will not speak to me, but will allow me to listen to Heart and Lungs.     ED Results / Procedures / Treatments   Labs (all labs ordered are listed, but only abnormal results are displayed) Labs Reviewed  COMPREHENSIVE METABOLIC PANEL - Abnormal; Notable for the following components:      Result Value   Potassium 3.1 (*)    CO2 20 (*)    All other components within normal limits  CBC - Abnormal; Notable for the following components:   Hemoglobin 10.7 (*)    HCT 34.8 (*)    All other components within normal limits  RESP PANEL BY RT-PCR (FLU A&B, COVID) ARPGX2  LACTIC ACID, PLASMA  ETHANOL  SALICYLATE LEVEL  ACETAMINOPHEN LEVEL  RAPID URINE DRUG SCREEN, HOSP PERFORMED  ETHYLENE GLYCOL  LACTIC ACID, PLASMA  OSMOLALITY  BASIC METABOLIC PANEL  CBC   BLOOD GAS, VENOUS  VOLATILES,BLD-ACETONE,ETHANOL,ISOPROP,METHANOL  I-STAT BETA HCG BLOOD, ED (MC, [facility], AP ONLY)    EKG None  Radiology No results found.  Procedures .Critical Care Performed by: Alfredia Client, PA-C  Authorized by: Alfredia Client, PA-C   Critical care provider statement:    Critical care time (minutes):  45   Critical care was time spent personally by me on the following activities:  Discussions with consultants, evaluation of patient's response to treatment, examination of patient, ordering and performing treatments and interventions, ordering and review of laboratory studies, ordering and review of radiographic studies, pulse oximetry, re-evaluation of patient's condition, obtaining history from patient or surrogate and review of old charts     Medications Ordered in ED Medications  fomepizole (ANTIZOL) 1,440 mg in sodium chloride 0.9 % 100 mL IVPB (has no administration in time range)  dextrose 5 % 100 mL with pyridOXINE (B-6) 100 mg infusion (has no administration in time range)  LORazepam (ATIVAN) tablet 1 mg (1 mg Oral Given 04/11/20 2153)  ondansetron (ZOFRAN) tablet 4 mg (4 mg Oral Given 04/11/20 2153)  thiamine (B-1) injection 100 mg (100 mg Intravenous Given 04/11/20 2154)    ED Course  I have reviewed the triage vital signs and the nursing notes.  Pertinent labs & imaging results that were available during my care of the patient were reviewed by me and considered in my medical decision making (see chart for details).    MDM Rules/Calculators/A&P                           [name] is a 50 y.o. female with PMH of bipolar disorder that presents to the ED under IVC by family members for bipolar episode with possible overdose of bleach and antifreeze. Pt will not speak to me, is pacing and seems very agitated, suspect manic episode. No overt signs of overdose. Pt rejects this. Pt is hypertensive, otherwise normal vitals. No respiratory  depression.  Spoke to poison control Almyra Free who spoke to toxicologist who wants medical admission. Wants to start thiamine, b6 and also fomepizole prior to levels coming back. Wants 15cc/kg now and then 10cc/kg at 12 hours. She does recommends medical admission at this time.   Dr. Flossie Buffy will accept. CMP back with no anion gap which is reassuring. No other labs back at this time. Pt stable for admission.   The patient appears reasonably stabilized for admission considering the current resources, flow, and capabilities available in the ED at this time, and I doubt any other [facility] requiring further screening and/or treatment in the ED prior to admission.  Final Clinical Impression(s) / ED Diagnoses Final diagnoses:  Ingestion of substance, intentional self-harm, initial encounter [facility])  Suicidal ideation  Manic behavior [facility])    Rx / [place] Orders ED Discharge Orders    None       Alfredia Client, PA-C 04/11/20 2220    Quintella Reichert, MD 04/12/20 1452

## 2020-04-11 NOTE — ED Notes (Signed)
EMS and [facility] called due to patient being IVC'd for transfer to [facility].

## 2020-04-11 NOTE — ED Notes (Signed)
Pt continued to move while BP was ongoing

## 2020-04-11 NOTE — ED Notes (Signed)
Report called to Anderson Malta, RN ED Charge Nurse Elvina Sidle.

## 2020-04-11 NOTE — BH Assessment (Addendum)
Comprehensive Clinical Assessment (CCA) Note  04/11/2020 [name] [id number]   Disposition: Earleen Newport, NP recommends patient to be transferred to ED for medical clearance due to concerns of drinking bleach/ anti-freeze. Meets in patient criteria.   Patient is a 50 year old female presenting to [facility] under IVC. Per IVC, "respondent attempted suicide by trying to drink bleach and anti-freeze. She also tried to give bleach to her children to drink. Is hostile and aggressive towards others. Believes her deceased mother is still alive and claims she sees people who are not there. Is not taking her prescribed medications. Is not eating or tending to personal hygiene. Has been committed before. Is a danger to herself and others."  Upon this counselor's exam patient is cooperative, however appears anxious and guarded. Patient gives limited history. She states she is here for "evaluation." Patient denies SI/HI/AVH, however, states "suicide is all around me. All this negativity is all around me. I can't swallow." She denies any substance use. She appears paranoid and delusional.  Collateral information obtained from patient's sister/legal guardian, [name], and sister in law, Rolland Bimler [phone] (sister/legal guardian): Patient lives with her oldest son (age 41). He contacted family due to ongoing concerns about patient's behavior. She is attempting to give his younger sibling bleach. For this reason the child is now living with other relatives. She has become increasing agitated and physically aggressive with her oldest son. She lock herself in her apartment and will not let anyone enter. She refuses all psychiatric medications. She drank bleach 2 days ago in a suicide attempt.  Chief Complaint: IVC  Visit Diagnosis: Schizophrenia   CCA Screening, Triage and Referral (STR)  Patient Reported Information How did you hear about [place]? Legal System  Referral name: Royetta Car  Referral phone number: [phone]   [place] do you see for routine medical problems? I don't have a doctor  Practice/Facility Name: No data recorded Practice/Facility Phone Number: No data recorded Name of Contact: No data recorded Contact Number: No data recorded Contact Fax Number: No data recorded Prescriber Name: No data recorded Prescriber Address (if known): No data recorded  What Is the Reason for Your Visit/Call Today? "evaluation"  How Long Has This Been Causing You Problems? > than 6 months  What Do You Feel Would Help You the Most Today? -- (evaluation)   Have You Recently Been in Any Inpatient Treatment (Hospital/Detox/Crisis Center/28-Day Program)? No  Name/Location of Program/Hospital:No data recorded How Long Were You There? No data recorded When Were You Discharged? No data recorded  Have You Ever Received Services From [facility] Before? Yes  Who Do You See at [facility]   Have You Recently Had Any Thoughts About Hurting Yourself? No  Are You Planning to Commit Suicide/Harm Yourself At This time? No   Have you Recently Had Thoughts About [place]? No  Explanation: No data recorded  Have You Used Any Alcohol or Drugs in the Past 24 Hours? No  How Long Ago Did You Use Drugs or Alcohol? No data recorded What Did You Use and How Much? No data recorded  Do You Currently Have a Therapist/Psychiatrist? No  Name of Therapist/Psychiatrist: No data recorded  Have You Been Recently Discharged From Any Office Practice or Programs? No  Explanation of Discharge From Practice/Program: No data recorded    CCA Screening Triage Referral Assessment Type of Contact: Face-to-Face  Is this Initial or Reassessment? No data recorded Date Telepsych consult ordered in  CHL:  No data recorded Time Telepsych consult ordered in CHL:  No data recorded  Patient Reported Information Reviewed? Yes  Patient Left Without Being Seen? No data  recorded Reason for Not Completing Assessment: No data recorded  Collateral Involvement: sisters, Joaquim Lai and Fredderick Severance   Does Patient Have a Stage manager Guardian? No data recorded Name and Contact of Legal Guardian: No data recorded If Minor and Not Living with Parent(s), Who has Custody? No data recorded Is CPS involved or ever been involved? Never  Is APS involved or ever been involved? Never   Patient Determined To Be At Risk for Harm To Self or Others Based on Review of Patient Reported Information or Presenting Complaint? Yes, for Self-Harm  Method: No Plan  Availability of Means: No access or NA  Intent: Intends to cause physical harm but not necessarily death  Notification Required: No need or identified person  Additional Information for Danger to Others Potential: Active psychosis  Additional Comments for Danger to Others Potential: Agitated, yelling, posturing and threatening.  Are There Guns or Other Weapons in [place]? No  Types of Guns/Weapons: No data recorded Are These Weapons Safely Secured?                            No data recorded Who Could Verify You Are Able To Have These Secured: No data recorded Do You Have any Outstanding Charges, Pending Court Dates, Parole/Probation? No data recorded Contacted To Inform of Risk of Harm To Self or Others: Law Enforcement   Location of Assessment: [facility]   Does Patient Present under Involuntary Commitment? Yes  IVC Papers Initial File Date: 04/11/2020   [place] of Residence: [place]   Patient Currently Receiving the Following Services: Not Receiving Services   Determination of Need: Urgent (48 hours)   Options For Referral: [facility]     CCA Biopsychosocial Intake/Chief Complaint:  Patient appears psychotic and is agitated upon arrival to [facility].  Current Symptoms/Problems: Patient is guarded and does not want to discuss concerns/stressors.   Patient Reported  Schizophrenia/Schizoaffective Diagnosis in Past: Yes   Strengths: NA  Preferences: NA  Abilities: NA   Type of Services Patient Feels are Needed: NA   Initial Clinical Notes/Concerns: NA   Mental Health Symptoms Depression:  Sleep (too much or little); Tearfulness   Duration of Depressive symptoms: Greater than two weeks   Mania:  Irritability; Racing thoughts; Recklessness   Anxiety:   Worrying; Tension; Restlessness   Psychosis:  Delusions; Grossly disorganized or catatonic behavior; Grossly disorganized speech; Hallucinations   Duration of Psychotic symptoms: Greater than six months   Trauma:  None   Obsessions:  Poor insight; Recurrent & persistent thoughts/impulses/images   Compulsions:  Poor Insight   Inattention:  Disorganized   Hyperactivity/Impulsivity:  N/A   Oppositional/Defiant Behaviors:  N/A   Emotional Irregularity:  Intense/inappropriate anger; Mood lability; Transient, stress-related paranoia/disassociation   Other Mood/Personality Symptoms:  No data recorded   Mental Status Exam Appearance and self-care  Stature:  Average   Weight:  Average weight   Clothing:  Casual   Grooming:  Neglected (spitting in cup, aggressively rubs head and face)   Cosmetic use:  None   Posture/gait:  Tense   Motor activity:  Restless; Agitated   Sensorium  Attention:  Distractible   Concentration:  Preoccupied   Orientation:  Person   Recall/memory:  Normal   Affect and Mood  Affect:  Labile; Anxious   Mood:  Irritable   Relating  Eye contact:  Fleeting   Facial expression:  Angry; Tense   Attitude toward examiner:  Defensive; Hostile; Irritable   Thought and Language  Speech flow: Pressured; Profane; Loud   Thought content:  Delusions; Suspicious   Preoccupation:  Religion   Hallucinations:  Visual; Auditory   Organization:  No data recorded  [employer] of Knowledge:  Average   Intelligence:  Average    Abstraction:  Concrete   Judgement:  Impaired   Reality Testing:  Distorted   Insight:  Poor   Decision Making:  Impulsive   Social Functioning  Social Maturity:  Impulsive   Social Judgement:  Naive   Stress  Stressors:  Family conflict   Coping Ability:  Overwhelmed   Skill Deficits:  Decision making; Interpersonal   Supports:  Family     Religion: Religion/Spirituality Are You A Religious Person?: Yes How Might This Affect Treatment?: UTA  Leisure/Recreation: Leisure / Recreation Do You Have Hobbies?: No  Exercise/Diet: Exercise/Diet Do You Exercise?: No Have You Gained or Lost A Significant Amount of Weight in the Past Six Months?: No Do You Follow a Special Diet?: No Do You Have Any Trouble Sleeping?: Yes   CCA Employment/Education Employment/Work Situation: Employment / Work Situation Employment situation: Unemployed Patient's job has been impacted by current illness: No What is the longest time patient has a held a job?: 5 years 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 Has patient ever been in the [employer]?: No  Education: Education Is Patient Currently Attending School?: No Did Teacher, adult education From [employer]?:  (UTA) Did You Attend College?:  (UTA) Did You Attend Graduate School?:  (UTA) Did You Have An Individualized Education Program (IIEP):  (UTA) Did You Have Any Difficulty At School?:  ([place]) Patient's Education Has Been Impacted by Current Illness:  (UTA)   CCA Family/Childhood History Family and Relationship History: Family history Are you sexually active?:  (pt did not answer question) What is your sexual orientation?: pt did not answer question Has your sexual activity been affected by drugs, alcohol, medication, or emotional stress?: pt did not answer question Does patient have children?: Yes  Childhood History:  Childhood History By whom was/is the patient raised?: Mother Additional childhood history  information: UTA Description of patient's relationship with caregiver when they were a child: good Patient's description of current relationship with people who raised him/her: deceased How were you disciplined when you got in trouble as a child/adolescent?: pt did not answer question Does patient have siblings?: Yes Number of Siblings: 1 Description of patient's current relationship with siblings: sister- legal guardian Did patient suffer any verbal/emotional/physical/sexual abuse as a child?: No Has patient ever been sexually abused/assaulted/raped as an adolescent or adult?: No Was the patient ever a victim of a crime or a disaster?: No Witnessed domestic violence?: No Has patient been affected by domestic violence as an adult?: No  Child/Adolescent Assessment:     CCA Substance Use Alcohol/Drug Use: Alcohol / Drug Use Pain Medications: None reported Prescriptions: None reported Over the Counter: None reported History of alcohol / drug use?: No history of alcohol / drug abuse                         ASAM's:  Six Dimensions of Multidimensional Assessment  Dimension 1:  Acute Intoxication and/or Withdrawal Potential:      Dimension 2:  Biomedical Conditions and Complications:      Dimension 3:  Emotional, Behavioral, or Cognitive Conditions and Complications:     Dimension 4:  Readiness to Change:     Dimension 5:  Relapse, Continued use, or Continued Problem Potential:     Dimension 6:  Recovery/Living Environment:     ASAM Severity Score:    ASAM Recommended Level of Treatment:     Substance use Disorder (SUD)    Recommendations for Services/Supports/Treatments:    DSM5 Diagnoses: Patient Active Problem List   Diagnosis Date Noted  . Bipolar affective disorder, manic, severe, with psychotic behavior ([place]) 07/29/2019  . Bipolar affective disorder, current episode manic ([place]) 05/18/2018  . Bipolar affective disorder, current episode manic with psychotic  symptoms ([place]) 05/18/2018  . Uterine fibroid 02/06/2017  . Bipolar affective disorder, mixed, severe, with psychotic behavior ([place]) 12/28/2016  . Bipolar I disorder, current or most recent episode manic, with psychotic features ([place]) 12/28/2016  . 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    Patient Centered Plan: Patient is on the following Treatment Plan(s):  Referrals to Alternative Service(s): Referred to Alternative Service(s):   Place:   Date:   Time:    Referred to Alternative Service(s):   Place:   Date:   Time:    Referred to Alternative Service(s):   Place:   Date:   Time:    Referred to Alternative Service(s):   Place:   Date:   Time:     Orvis Brill, LCSW

## 2020-04-12 DIAGNOSIS — E876 Hypokalemia: Secondary | ICD-10-CM | POA: Diagnosis present

## 2020-04-12 DIAGNOSIS — E669 Obesity, unspecified: Secondary | ICD-10-CM | POA: Diagnosis present

## 2020-04-12 DIAGNOSIS — Z20822 Contact with and (suspected) exposure to covid-19: Secondary | ICD-10-CM | POA: Diagnosis present

## 2020-04-12 DIAGNOSIS — T6591XA Toxic effect of unspecified substance, accidental (unintentional), initial encounter: Secondary | ICD-10-CM | POA: Diagnosis not present

## 2020-04-12 DIAGNOSIS — T6592XA Toxic effect of unspecified substance, intentional self-harm, initial encounter: Secondary | ICD-10-CM

## 2020-04-12 DIAGNOSIS — E872 Acidosis: Secondary | ICD-10-CM | POA: Diagnosis present

## 2020-04-12 DIAGNOSIS — Z825 Family history of asthma and other chronic lower respiratory diseases: Secondary | ICD-10-CM | POA: Diagnosis not present

## 2020-04-12 DIAGNOSIS — T1491XA Suicide attempt, initial encounter: Secondary | ICD-10-CM | POA: Diagnosis not present

## 2020-04-12 DIAGNOSIS — F600 Paranoid personality disorder: Secondary | ICD-10-CM | POA: Diagnosis present

## 2020-04-12 DIAGNOSIS — Z6833 Body mass index (BMI) 33.0-33.9, adult: Secondary | ICD-10-CM | POA: Diagnosis not present

## 2020-04-12 DIAGNOSIS — Y929 Unspecified place or not applicable: Secondary | ICD-10-CM | POA: Diagnosis not present

## 2020-04-12 DIAGNOSIS — Z8249 Family history of ischemic heart disease and other diseases of the circulatory system: Secondary | ICD-10-CM | POA: Diagnosis not present

## 2020-04-12 DIAGNOSIS — T518X2A Toxic effect of other alcohols, intentional self-harm, initial encounter: Secondary | ICD-10-CM | POA: Diagnosis present

## 2020-04-12 DIAGNOSIS — I10 Essential (primary) hypertension: Secondary | ICD-10-CM | POA: Diagnosis present

## 2020-04-12 DIAGNOSIS — R45851 Suicidal ideations: Secondary | ICD-10-CM | POA: Diagnosis not present

## 2020-04-12 DIAGNOSIS — Z91040 Latex allergy status: Secondary | ICD-10-CM | POA: Diagnosis not present

## 2020-04-12 DIAGNOSIS — D509 Iron deficiency anemia, unspecified: Secondary | ICD-10-CM | POA: Diagnosis present

## 2020-04-12 DIAGNOSIS — Z79899 Other long term (current) drug therapy: Secondary | ICD-10-CM | POA: Diagnosis not present

## 2020-04-12 DIAGNOSIS — F3164 Bipolar disorder, current episode mixed, severe, with psychotic features: Secondary | ICD-10-CM | POA: Diagnosis present

## 2020-04-12 DIAGNOSIS — Z888 Allergy status to other drugs, medicaments and biological substances status: Secondary | ICD-10-CM | POA: Diagnosis not present

## 2020-04-12 DIAGNOSIS — Z833 Family history of diabetes mellitus: Secondary | ICD-10-CM | POA: Diagnosis not present

## 2020-04-12 DIAGNOSIS — Z9114 Patient's other noncompliance with medication regimen: Secondary | ICD-10-CM | POA: Diagnosis not present

## 2020-04-12 DIAGNOSIS — T5492XA Toxic effect of unspecified corrosive substance, intentional self-harm, initial encounter: Secondary | ICD-10-CM | POA: Diagnosis not present

## 2020-04-12 DIAGNOSIS — F3010 Manic episode without psychotic symptoms, unspecified: Secondary | ICD-10-CM

## 2020-04-12 LAB — RESP PANEL BY RT-PCR (FLU A&B, COVID) ARPGX2
Influenza A by PCR: NEGATIVE
Influenza B by PCR: NEGATIVE
SARS Coronavirus 2 by RT PCR: NEGATIVE

## 2020-04-12 LAB — RAPID URINE DRUG SCREEN, HOSP PERFORMED
Amphetamines: NOT DETECTED
Barbiturates: NOT DETECTED
Benzodiazepines: NOT DETECTED
Cocaine: NOT DETECTED
Opiates: NOT DETECTED
Tetrahydrocannabinol: NOT DETECTED

## 2020-04-12 LAB — BASIC METABOLIC PANEL
Anion gap: 9 (ref 5–15)
BUN: 8 mg/dL (ref 6–20)
CO2: 20 mmol/L — ABNORMAL LOW (ref 22–32)
Calcium: 8.7 mg/dL — ABNORMAL LOW (ref 8.9–10.3)
Chloride: 106 mmol/L (ref 98–111)
Creatinine, Ser: 0.67 mg/dL (ref 0.44–1.00)
GFR, Estimated: >60 mL/min (ref >60)
Glucose, Bld: 99 mg/dL (ref 70–99)
Potassium: 3.1 mmol/L — ABNORMAL LOW (ref 3.5–5.1)
Sodium: 135 mmol/L (ref 135–145)

## 2020-04-12 LAB — CBC
HCT: 35.2 % — ABNORMAL LOW (ref 36.0–46.0)
Hemoglobin: 11.2 g/dL — ABNORMAL LOW (ref 12.0–15.0)
MCH: 27.5 pg (ref 26.0–34.0)
MCHC: 31.8 g/dL (ref 30.0–36.0)
MCV: 86.3 fL (ref 80.0–100.0)
Platelets: 310 10*3/uL (ref 150–400)
RBC: 4.08 MIL/uL (ref 3.87–5.11)
RDW: 14.1 % (ref 11.5–15.5)
WBC: 5.9 10*3/uL (ref 4.0–10.5)
nRBC: 0 % (ref 0.0–0.2)

## 2020-04-12 LAB — OSMOLALITY
Osmolality: 286 mOsm/kg (ref 275–295)
Osmolality: 288 mOsm/kg (ref 275–295)

## 2020-04-12 LAB — VOLATILES,BLD-ACETONE,ETHANOL,ISOPROP,METHANOL
Acetone, blood: <0.01 g/dL (ref 0.000–0.010)
Acetone, blood: <0.01 g/dL (ref 0.000–0.010)
Ethanol, blood: <0.01 g/dL (ref 0.000–0.010)
Ethanol, blood: <0.01 g/dL (ref 0.000–0.010)
Isopropanol, blood: <0.01 g/dL (ref 0.000–0.010)
Isopropanol, blood: <0.01 g/dL (ref 0.000–0.010)
Methanol, blood: <0.01 g/dL (ref 0.000–0.010)
Methanol, blood: <0.01 g/dL (ref 0.000–0.010)

## 2020-04-12 LAB — ETHYLENE GLYCOL
Ethylene Glycol Lvl: <5 mg/dL
Ethylene Glycol Lvl: <5 mg/dL

## 2020-04-12 LAB — MAGNESIUM: Magnesium: 1.9 mg/dL (ref 1.7–2.4)

## 2020-04-12 LAB — LACTIC ACID, PLASMA: Lactic Acid, Venous: 1.1 mmol/L (ref 0.5–1.9)

## 2020-04-12 MED ORDER — OLANZAPINE 5 MG PO TBDP
5.0000 mg | ORAL_TABLET | Freq: Two times a day (BID) | ORAL | Status: DC
  Administered 2020-04-12 – 2020-04-13 (×3): 5 mg via ORAL
  Filled 2020-04-12 (×4): qty 1

## 2020-04-12 MED ORDER — DIVALPROEX SODIUM 250 MG PO DR TAB
250.0000 mg | DELAYED_RELEASE_TABLET | Freq: Two times a day (BID) | ORAL | Status: DC
  Administered 2020-04-12 – 2020-04-15 (×7): 250 mg via ORAL
  Filled 2020-04-12 (×7): qty 1

## 2020-04-12 MED ORDER — POTASSIUM CHLORIDE CRYS ER 20 MEQ PO TBCR
40.0000 meq | EXTENDED_RELEASE_TABLET | ORAL | Status: AC
  Administered 2020-04-12 (×2): 40 meq via ORAL
  Filled 2020-04-12 (×2): qty 2

## 2020-04-12 MED ORDER — ACETAMINOPHEN 325 MG PO TABS
650.0000 mg | ORAL_TABLET | Freq: Four times a day (QID) | ORAL | Status: DC | PRN
  Administered 2020-04-12: 650 mg via ORAL
  Filled 2020-04-12: qty 2

## 2020-04-12 MED ORDER — ENOXAPARIN SODIUM 40 MG/0.4ML ~~LOC~~ SOLN
40.0000 mg | Freq: Every day | SUBCUTANEOUS | Status: DC
  Administered 2020-04-13 – 2020-04-14 (×2): 40 mg via SUBCUTANEOUS
  Filled 2020-04-12 (×3): qty 0.4

## 2020-04-12 NOTE — TOC Progression Note (Signed)
Transition of Care [facility]) - Progression Note    Patient Details  Name: [name] MRN: [id number] Date of Birth: 06-21-1970  Transition of Care [facility]) CM/SW Contact  Purcell Mouton, RN Phone Number: 04/12/2020, 2:52 PM  Clinical Narrative:    Referral sent to [facility] and [place].   Expected Discharge Plan: Psychiatric Hospital Barriers to Discharge: No Barriers Identified  Expected Discharge Plan and Services Expected Discharge Plan: [place] arrangements for the past 2 months: Single Family Home                                       Social Determinants of Health (SDOH) Interventions    Readmission Risk Interventions No flowsheet data found.

## 2020-04-12 NOTE — Consult Note (Signed)
[name] is a 50 y.o. female with medical history significant for bipolar disorder with psychosis, HTN who presents as an IVC from [facility].   Pt does not answer questions/selectively mute during evaluation although she does nod or shake her head at yes/no questions. No family at bedside.  Shakes her head when asked about pain and whether she ingested chemicals.  Pt was brought to behavior health urgent care by family and per chart review, IVC was petitioned by sister who reports that patient "respondent attempted suicide by trying to drink bleach and anti-freeze. She also tried to give bleach to her children to drink. Is hostile and aggressive towards others. Believes her deceased mother is still alive and claims she sees people who are not there. Is not taking her prescribed medications. Is not eating or tending to personal hygiene. Has been committed before. Is a danger to herself and others.""respondent attempted suicide by trying to drink bleach and anti-freeze. She also tried to give bleach to her children to drink. Is hostile and aggressive towards others. Believes her deceased mother is still alive and claims she sees people who are not there. Is not taking her prescribed medications. Is not eating or tending to personal hygiene. Has been committed before. Is a danger to herself and others."  Patient has had numerous psychiatry  hospitalization in the past. Last one in 08/2019 for bipolar disorder with aggression and paranoia. Has not taken medication since.   Patient seen and chart reviewed.  Psych consult placed for suicide attempt and homicide attempt. On evaluation patient is alert and oriented, however is not willing to participate in psychiatric evaluation.  Patient did agree to answer some questions, once they were simplified by and shaking her head or nodding her head.  Patient states she is able to recall what happened yesterday.  She reports she  was not attempting to harm herself or anyone else.  Through use of paraphrasing, assess for homicidal intent towards her children in which she again shook her head no.  This nurse practitioner assess for previous psychiatric history, in which she again shook her head no.  She currently denies any active suicidal ideations, homicidal ideations, and or auditory or visual hallucinations.  She does not appear to be aggressive at this time, she is not exhibiting any disruptive behaviors, agitation, restlessness, or exhibiting any psychosis.  She does not appear to be responding to internal or external stimuli, and or exhibiting delusional thought disorder.  Patient has a strong psychiatric history to include bipolar disorder with psychosis, aggression, and paranoia.  Her psych history consists of multiple hospitalizations, multiple medication trials, to include long-acting injectables.  At this time patient is not under IVC, however with her history of hostility and aggression towards others, current accusations of drinking bleach, and as well as harming her children will recommend placing her under IVC.  In the event she has tried to harm her children, will recommend CPS report and referral.  Given her history of instability, medication noncompliance, and increasing level of lethality and current engagement in dangerous behaviors, will need inpatient psych admission.  Patient was recently assessed less than 24 hours ago at behavioral health urgent care, please see consult note and medical screening examination completed at that time.  -Will resume medications to include olanzapine Zydis 5 mg p.o. twice daily to target psychosis, mood stabilization, as well as manage her level of aggression.  EKG obtained, QTC of 454. -Will resume valproic acid 250  mg p.o. twice daily for mood stabilization, aggression, agitation, and bipolar disorder.  -We will recommend inpatient psych admission once she is medically  stable. -Recommend reaching out to social work, to initiate referral for inpatient psych.  If no appropriate beds are available at [facility], please refer out of system. -Also will need to initiate CPS referral, consider social work consult. -Recommend initiating IVC for safety of patient and safety of others.

## 2020-04-12 NOTE — Progress Notes (Signed)
PROGRESS NOTE  [name] EPP:[id number] DOB: 1970-12-10   PCP: Pcp, No  Patient is from: [facility]  DOA: 04/11/2020 LOS: 0  Chief complaints: "attempted suicide by drinking bleach and anti-freeze"  Brief Narrative / Interim history: 50 year old F with PMH of psychosis, bipolar disorder and HTN brought to ED from [facility] due to "attempted suicide and homicide by trying to drink bleach and antifreeze and also trying to make a children drink".  She was also hostile and aggressive towards others.  Reportedly had visual hallucination thinking her deceased mother is still alive.  Reportedly, she claims to see people who are not there.  IVC was petitioned by his sister who gave the above report.   In ED, SBP elevated to 180s but has been restless during BP checks.  Hypokalemic to 3.1.  No acidosis or anion gap.  Hgb 10.7.  Lactic acid 0.9.  EKG NSR.  Poison control contacted by ED PA and recommended fomepizole infusion.  Subjective: Seen and examined earlier this morning.  She was a standing by the sink brushing her teeth.  No complaints.  She says she is here for treatment but does not elaborate.  She decided to use the bathroom before further discussion.  She is ambulating and maneuvering her drip without difficulty.  Objective: Vitals:   04/11/20 2351 04/12/20 0329 04/12/20 0710 04/12/20 1123  BP:  (!) 153/95 136/83 135/89  Pulse:  64 (!) 56 64  Resp:      Temp:  98 F (36.7 C) 98 F (36.7 C) 99 F (37.2 C)  TempSrc:  Oral Oral Oral  SpO2:  100% 100% 100%  Weight: 95.7 kg     Height: 5\' 7"  (1.702 m)       Intake/Output Summary (Last 24 hours) at 04/12/2020 1127 Last data filed at 04/12/2020 1005 Gross per 24 hour  Intake 696.93 ml  Output 2 ml  Net 694.93 ml   Filed Weights   04/11/20 2351  Weight: 95.7 kg    Examination:  GENERAL: No apparent distress.  Nontoxic. HEENT: MMM.  Vision and hearing grossly  intact.  NECK: Supple.  No apparent JVD.  RESP:  No IWOB.  Fair aeration bilaterally. CVS:  RRR. Heart sounds normal.  ABD/GI/GU: BS+. Abd soft, NTND.  MSK/EXT:  Moves extremities. No apparent deformity. No edema.  SKIN: no apparent skin lesion or wound NEURO: Awake and alert.  No apparent focal neuro deficit.  Ambulating in the room without problem. PSYCH: Calm.  Flat affect.  Procedures:  None  Microbiology summarized: [id number]-16 and influenza PCR nonreactive.  Assessment & Plan: Alleged suicidal and homicidal attempts Intentional ingestion of bleach and ethylene glycol  History of bipolar disorder with psychosis, aggression and paranoia -Hemodynamically stable.  No significant finding on EKG.  Mild non-anion gap acidosis -VBG, lactic acid, osmolality, Tylenol, salicylate, ethyl glycol, acetone, ethanol, isopropyl propylene and methanol within normal.  -Continue fomepizole 10mg /kg q12hrs per poison control.  -Continue thiamine -Follow urine drug screen -Follow further recommendation by poison control -Appreciate recommendation by psychiatry  -Resume Z days, valproic acid  -Admission to inpatient psychiatric hospital once medically cleared  -Initiate CPS referral  -Initiate IVC for safety of patient and safety of others-TOC to look in to IVC dates since petition was signed by sister   Uncontrolled hypertension: On arrival SBP elevated to 180s.  DBP elevated to 110s.  BP within acceptable range this morning. -PRN IV Hydralazine for systolic greater than 010  Hypokalemia: K3.1.  Mg 1.9. -P.o. K-Dur 40 mEq x 2  Mild non-anion gap metabolic acidosis: Stable. -Continue monitoring  Noncompliance with medication  Class I obesity Body mass index is 33.04 kg/m.         DVT prophylaxis:  SCDs Start: 04/11/20 2211  Code Status: Full code Family Communication: Patient and/or RN. Available if any question.  Level of care: Progressive Status is: Observation  The patient  will require care spanning > 2 midnights and should be moved to inpatient because: Ongoing diagnostic testing needed not appropriate for outpatient work up, Unsafe d/c plan, IV treatments appropriate due to intensity of illness or inability to take PO and Inpatient level of care appropriate due to severity of illness  Dispo: The patient is from: [facility]              Anticipated d/c is to: Inpatient psychiatry hospital              Patient currently is not medically stable to d/c.   Difficult to place patient No       Consultants:  Poison control Psychiatry   Sch Meds:  Scheduled Meds: . divalproex  250 mg Oral Q12H  . OLANZapine zydis  5 mg Oral BID  . potassium chloride  40 mEq Oral Q4H   Continuous Infusions: . fomepizole (ANTIZOL) IV 960 mg (04/12/20 1018)   PRN Meds:.hydrALAZINE  Antimicrobials: Anti-infectives (From admission, onward)   None       I have personally reviewed the following labs and images: CBC: Recent Labs  Lab 04/11/20 2104 04/12/20 0016  WBC 5.3 5.9  HGB 10.7* 11.2*  HCT 34.8* 35.2*  MCV 87.7 86.3  PLT 322 310   BMP &GFR Recent Labs  Lab 04/11/20 2104 04/12/20 0016 04/12/20 0927  NA 140 135  --   K 3.1* 3.1*  --   CL 109 106  --   CO2 20* 20*  --   GLUCOSE 84 99  --   BUN 9 8  --   CREATININE 0.54 0.67  --   CALCIUM 8.9 8.7*  --   MG 2.0  --  1.9   Estimated Creatinine Clearance: 103.2 mL/min (by C-G formula based on SCr of 0.67 mg/dL). Liver & Pancreas: Recent Labs  Lab 04/11/20 2104  AST 29  ALT 11  ALKPHOS 46  BILITOT 0.6  PROT 7.5  ALBUMIN 3.9   No results for input(s): LIPASE, AMYLASE in the last 168 hours. No results for input(s): AMMONIA in the last 168 hours. Diabetic: No results for input(s): HGBA1C in the last 72 hours. No results for input(s): GLUCAP in the last 168 hours. Cardiac Enzymes: No results for input(s): CKTOTAL, CKMB, CKMBINDEX, TROPONINI in the last 168 hours. No  results for input(s): PROBNP in the last 8760 hours. Coagulation Profile: No results for input(s): INR, PROTIME in the last 168 hours. Thyroid Function Tests: No results for input(s): TSH, T4TOTAL, FREET4, T3FREE, THYROIDAB in the last 72 hours. Lipid Profile: No results for input(s): CHOL, HDL, LDLCALC, TRIG, CHOLHDL, LDLDIRECT in the last 72 hours. Anemia Panel: No results for input(s): VITAMINB12, FOLATE, FERRITIN, TIBC, IRON, RETICCTPCT in the last 72 hours. Urine analysis:    Component Value Date/Time   COLORURINE YELLOW 03/22/2007 2217   APPEARANCEUR CLOUDY (A) 03/22/2007 2217   LABSPEC 1.015 03/22/2007 2217   PHURINE 8.0 03/22/2007 2217   GLUCOSEU NEGATIVE 03/22/2007 2217   HGBUR MODERATE (A) 03/22/2007 2217   BILIRUBINUR NEGATIVE  03/22/2007 2217   [place] 03/22/2007 2217   PROTEINUR NEGATIVE 03/22/2007 2217   UROBILINOGEN 0.2 03/22/2007 2217   NITRITE NEGATIVE 03/22/2007 2217   LEUKOCYTESUR NEGATIVE 03/22/2007 2217   Sepsis Labs: Invalid input(s): PROCALCITONIN, [place]  Microbiology: Recent Results (from the past 240 hour(s))  Resp Panel by RT-PCR (Flu A&B, Covid) Nasopharyngeal Swab     Status: None   Collection Time: 04/11/20 11:05 PM   Specimen: Nasopharyngeal Swab; Nasopharyngeal(NP) swabs in vial transport medium  Result Value Ref Range Status   SARS Coronavirus 2 by RT PCR NEGATIVE NEGATIVE Final    Comment: (NOTE) SARS-CoV-2 target nucleic acids are NOT DETECTED.  The SARS-CoV-2 RNA is generally detectable in upper respiratory specimens during the acute phase of infection. The lowest concentration of SARS-CoV-2 viral copies this assay can detect is 138 copies/mL. A negative result does not preclude SARS-Cov-2 infection and should not be used as the sole basis for treatment or other patient management decisions. A negative result may occur with  improper specimen collection/handling, submission of specimen other than nasopharyngeal swab,  presence of viral mutation(s) within the areas targeted by this assay, and inadequate number of viral copies(<138 copies/mL). A negative result must be combined with clinical observations, patient history, and epidemiological information. The expected result is Negative.  Fact Sheet for Patients:  EntrepreneurPulse.com.au  Fact Sheet for Healthcare Providers:  IncredibleEmployment.be  This test is no t yet approved or cleared by the [place] FDA and  has been authorized for detection and/or diagnosis of SARS-CoV-2 by FDA under an Emergency Use Authorization (EUA). This EUA will remain  in effect (meaning this test can be used) for the duration of the COVID-19 declaration under Section 564(b)(1) of the Act, 21 U.S.C.section 360bbb-3(b)(1), unless the authorization is terminated  or revoked sooner.       Influenza A by PCR NEGATIVE NEGATIVE Final   Influenza B by PCR NEGATIVE NEGATIVE Final    Comment: (NOTE) The Xpert Xpress SARS-CoV-2/FLU/RSV plus assay is intended as an aid in the diagnosis of influenza from Nasopharyngeal swab specimens and should not be used as a sole basis for treatment. Nasal washings and aspirates are unacceptable for Xpert Xpress SARS-CoV-2/FLU/RSV testing.  Fact Sheet for Patients: EntrepreneurPulse.com.au  Fact Sheet for Healthcare Providers: IncredibleEmployment.be  This test is not yet approved or cleared by the [place] FDA and has been authorized for detection and/or diagnosis of SARS-CoV-2 by FDA under an Emergency Use Authorization (EUA). This EUA will remain in effect (meaning this test can be used) for the duration of the COVID-19 declaration under Section 564(b)(1) of the Act, 21 U.S.C. section 360bbb-3(b)(1), unless the authorization is terminated or revoked.  Performed at [facility], [place] [facility] [address] [place], [place] [address]      Radiology Studies: No results found.    Antara Brecheisen T. [place]  If 7PM-7AM, please contact night-coverage [email or web address] 04/12/2020, 11:27 AM

## 2020-04-12 NOTE — TOC Progression Note (Signed)
Transition of Care [facility]) - Progression Note    Patient Details  Name: [name] MRN: [id number] Date of Birth: [birth year]  [facility]) CM/SW Contact  Purcell Mouton, RN Phone Number: 04/12/2020, 2:40 PM  Clinical Narrative:     Pt lives with her sister. Plan to discharge to [facility].   Expected Discharge Plan: Psychiatric Hospital Barriers to Discharge: No Barriers Identified  Expected Discharge Plan and Services Expected Discharge Plan: [place] arrangements for the past 2 months: Single Family Home                                       Social Determinants of Health (SDOH) Interventions    Readmission Risk Interventions No flowsheet data found.

## 2020-04-13 DIAGNOSIS — D509 Iron deficiency anemia, unspecified: Secondary | ICD-10-CM

## 2020-04-13 LAB — IRON AND TIBC
Iron: 25 ug/dL — ABNORMAL LOW (ref 28–170)
Saturation Ratios: 6 % — ABNORMAL LOW (ref 10.4–31.8)
TIBC: 429 ug/dL (ref 250–450)
UIBC: 404 ug/dL

## 2020-04-13 LAB — FERRITIN: Ferritin: 6 ng/mL — ABNORMAL LOW (ref 11–307)

## 2020-04-13 LAB — VITAMIN B12: Vitamin B-12: 545 pg/mL (ref 180–914)

## 2020-04-13 LAB — RETICULOCYTES
Immature Retic Fract: 20.4 % — ABNORMAL HIGH (ref 2.3–15.9)
RBC.: 4.35 MIL/uL (ref 3.87–5.11)
Retic Count, Absolute: 72.6 10*3/uL (ref 19.0–186.0)
Retic Ct Pct: 1.7 % (ref 0.4–3.1)

## 2020-04-13 LAB — FOLATE: Folate: 10.3 ng/mL (ref >5.9)

## 2020-04-13 MED ORDER — OLANZAPINE 5 MG PO TBDP
5.0000 mg | ORAL_TABLET | Freq: Every day | ORAL | Status: DC
  Filled 2020-04-13: qty 1

## 2020-04-13 MED ORDER — DOCUSATE SODIUM 100 MG PO CAPS
100.0000 mg | ORAL_CAPSULE | Freq: Every day | ORAL | Status: DC
  Administered 2020-04-13 – 2020-04-14 (×2): 100 mg via ORAL
  Filled 2020-04-13: qty 1

## 2020-04-13 MED ORDER — OLANZAPINE 5 MG PO TBDP
10.0000 mg | ORAL_TABLET | Freq: Every day | ORAL | Status: DC
  Administered 2020-04-13: 10 mg via ORAL
  Filled 2020-04-13 (×2): qty 2

## 2020-04-13 MED ORDER — AMLODIPINE BESYLATE 10 MG PO TABS
10.0000 mg | ORAL_TABLET | Freq: Every day | ORAL | Status: DC
  Administered 2020-04-13 – 2020-04-15 (×3): 10 mg via ORAL
  Filled 2020-04-13 (×2): qty 1

## 2020-04-13 MED ORDER — LORATADINE 10 MG PO TABS
10.0000 mg | ORAL_TABLET | Freq: Every day | ORAL | Status: DC
  Administered 2020-04-13 – 2020-04-15 (×3): 10 mg via ORAL
  Filled 2020-04-13 (×2): qty 1

## 2020-04-13 MED ORDER — SENNOSIDES-DOCUSATE SODIUM 8.6-50 MG PO TABS: 1.0000 | ORAL_TABLET | Freq: Two times a day (BID) | ORAL | Status: DC | PRN

## 2020-04-13 MED ORDER — HYDRALAZINE HCL 25 MG PO TABS: 25.0000 mg | ORAL_TABLET | Freq: Four times a day (QID) | ORAL | Status: DC | PRN

## 2020-04-13 MED ORDER — SODIUM CHLORIDE 0.9 % IV SOLN
510.0000 mg | Freq: Once | INTRAVENOUS | Status: AC
  Administered 2020-04-14: 510 mg via INTRAVENOUS
  Filled 2020-04-13: qty 17
  Filled 2020-04-13: qty 510

## 2020-04-13 MED ORDER — FERROUS SULFATE 325 (65 FE) MG PO TABS
325.0000 mg | ORAL_TABLET | Freq: Two times a day (BID) | ORAL | Status: DC
  Administered 2020-04-14 – 2020-04-15 (×3): 325 mg via ORAL
  Filled 2020-04-13 (×3): qty 1

## 2020-04-13 MED ORDER — POLYETHYLENE GLYCOL 3350 17 G PO PACK: 17.0000 g | PACK | Freq: Two times a day (BID) | ORAL | Status: DC | PRN

## 2020-04-13 NOTE — Consult Note (Signed)
  Patient continues to meet inpatient criteria at this time. Writer attempted to assess patient, however she remains nonverbal at this time. She appears to be visibly anxious as she has tremors and shaking of her lower extremities. She does not make eye contact with this writer, and does not engage. She is observed to be sitting upright at bedside chair, and recently completed breakfast. She remains on 1:1 sitter at this time.  No new concerns at this time. She would not answer when assessing for suicidality, homicidality and hallucinations.   -Will increase Zyprexa 5mg  po qam and Zyprexa 10mg  po qhs to further target bipolar disorder.  -Psychiatry to continue to follow. Patient has been referred to [facility] and [facility]. If no appropriate beds available please consider faxing patient out to other hospital systems.

## 2020-04-13 NOTE — TOC Progression Note (Addendum)
Transition of Care [facility]) - Progression Note    Patient Details  Name: [name] MRN: [id number] Date of Birth: [birth year]  Transition of Care [facility]) CM/SW Contact  Purcell Mouton, RN Phone Number: 04/13/2020, 10:31 AM  Clinical Narrative:     Clinicals faxed to outside Psychiatric Hospitals. IVC ends on 4/18 at 6PM.   Expected Discharge Plan: Psychiatric Hospital Barriers to Discharge: No Barriers Identified  Expected Discharge Plan and Services Expected Discharge Plan: [place] arrangements for the past 2 months: Single Family Home                                       Social Determinants of Health (SDOH) Interventions    Readmission Risk Interventions No flowsheet data found.

## 2020-04-13 NOTE — Progress Notes (Signed)
PROGRESS NOTE  [name] MEQ:[id number] DOB: [birth year]   PCP: Pcp, No  Patient is from: [facility]  DOA: 04/11/2020 LOS: 1  Chief complaints: "attempted suicide by drinking bleach and anti-freeze"  Brief Narrative / Interim history: 50 year old F with PMH of psychosis, bipolar disorder and HTN brought to ED from [facility] due to "attempted suicide and homicide by trying to drink bleach and antifreeze and also trying to make a children drink".  She was also hostile and aggressive towards others.  Reportedly had visual hallucination thinking her deceased mother is still alive.  Reportedly, she claims to see people who are not there.  IVC was petitioned by his sister who gave the above report.   In ED, SBP elevated to 180s but has been restless during BP checks.  Hypokalemic to 3.1.  Mild metabolic acidosis without anion gap.  Hgb 10.7.  Lactic acid 0.9.  EKG NSR.  Poison control contacted by ED PA and recommended fomepizole infusion.  The next day, VBG, lactic acid, osmolality, Tylenol, salicylate, ethyl glycol, acetone, ethanol, isopropyl propylene and methanol within normal.  Fomepizole discontinued and she was cleared by poison control.  Psychiatry started psych medications and recommended inpatient psychiatric hospitalization.   Subjective: Seen and examined earlier this morning.  No major events overnight of this morning.  She does not converse.  She seems to nod no to pain, shortness of breath, nausea, vomiting, headache or abdominal pain.  Does not appear to be in distress.   Objective: Vitals:   04/12/20 1123 04/12/20 1407 04/13/20 0528 04/13/20 1350  BP: 135/89 (!) 169/104 (!) 162/91 (!) 188/114  Pulse: 64 76 (!) 51 68  Resp:   20 16  Temp: 99 F (37.2 C) 98.2 F (36.8 C) 97.7 F (36.5 C) 97.9 F (36.6 C)  TempSrc: Oral Oral Oral Oral  SpO2: 100% 100% 99% 99%  Weight:      Height:       No intake or  output data in the 24 hours ending 04/13/20 1730 Filed Weights   04/11/20 2351  Weight: 95.7 kg    Examination:  GENERAL: No apparent distress.  Nontoxic. HEENT: MMM.  Vision and hearing grossly intact.  NECK: Supple.  No apparent JVD.  RESP:  No IWOB.  Fair aeration bilaterally. CVS:  RRR. Heart sounds normal.  ABD/GI/GU: BS+. Abd soft, NTND.  MSK/EXT:  Moves extremities. No apparent deformity. No edema.  SKIN: no apparent skin lesion or wound NEURO: Awake and alert.  Not able to assess orientation.  No apparent focal neuro deficit. PSYCH: Quiet.  Does not converse.  No distress or agitation.  Procedures:  None  Microbiology summarized: [id number] and influenza PCR nonreactive.  Assessment & Plan: Alleged suicidal and homicidal attempts Intentional ingestion of bleach and ethylene glycol  History of bipolar disorder with psychosis, aggression and paranoia -Hemodynamically stable except for elevated BP.  No significant finding on EKG.  Mild non-anion gap acidosis -VBG, lactic acid, osmolality, Tylenol, salicylate, ethyl glycol, acetone, ethanol, isopropyl propylene and methanol, UDS within normal.  -Continue thiamine -Cleared by poison control -Appreciate recommendation by psychiatry  -Zyprexa and Depakote  -Admission to inpatient psychiatric hospital once medically cleared  -Initiate CPS referral for her children's safety  -IVC in place 04/18/2020 per TOC.   Uncontrolled hypertension: BP elevated to 188/114 after initial improvement. -Resume home amlodipine -Hydralazine 25 mg p.o. every 6 hours as needed  Iron deficiency anemia: H&H stable.  Iron saturation 6%.  Ferritin 6.  TIBC 429. Recent Labs    07/28/19 1528 02/04/20 1520 04/11/20 2104 04/12/20 0016  HGB 10.4* 11.1 10.7* 11.2*  -IV Feraheme in the morning -P.o. ferrous sulfate with bowel regimen   Hypokalemia: K3.1.  Mg 1.9.  Replenished. -Recheck in the morning  Mild non-anion gap metabolic acidosis:  Stable. -Continue monitoring  Noncompliance with medication  Class I obesity Body mass index is 33.04 kg/m.         DVT prophylaxis:  enoxaparin (LOVENOX) injection 40 mg Start: 04/12/20 1515 SCDs Start: 04/11/20 2211  Code Status: Full code Family Communication: Updated patient's sister and son at bedside. Level of care: Telemetry  Status is: Inpatient  Remains inpatient appropriate because:Unsafe d/c plan   Dispo: The patient is from: Home              Anticipated d/c is to: Inpatient psychiatric hospital              Patient currently is medically stable to d/c.   Difficult to place patient No           Consultants:  Poison control-signed off Psychiatry-following   Sch Meds:  Scheduled Meds: . amLODipine  10 mg Oral Daily  . divalproex  250 mg Oral Q12H  . enoxaparin (LOVENOX) injection  40 mg Subcutaneous Daily  . [START ON 04/14/2020] OLANZapine zydis  5 mg Oral QHS   And  . OLANZapine zydis  10 mg Oral QHS   Continuous Infusions:  PRN Meds:.acetaminophen, hydrALAZINE  Antimicrobials: Anti-infectives (From admission, onward)   None       I have personally reviewed the following labs and images: CBC: Recent Labs  Lab 04/11/20 2104 04/12/20 0016  WBC 5.3 5.9  HGB 10.7* 11.2*  HCT 34.8* 35.2*  MCV 87.7 86.3  PLT 322 310   BMP &GFR Recent Labs  Lab 04/11/20 2104 04/12/20 0016 04/12/20 0927  NA 140 135  --   K 3.1* 3.1*  --   CL 109 106  --   CO2 20* 20*  --   GLUCOSE 84 99  --   BUN 9 8  --   CREATININE 0.54 0.67  --   CALCIUM 8.9 8.7*  --   MG 2.0  --  1.9   Estimated Creatinine Clearance: 103.2 mL/min (by C-G formula based on SCr of 0.67 mg/dL). Liver & Pancreas: Recent Labs  Lab 04/11/20 2104  AST 29  ALT 11  ALKPHOS 46  BILITOT 0.6  PROT 7.5  ALBUMIN 3.9   No results for input(s): LIPASE, AMYLASE in the last 168 hours. No results for input(s): AMMONIA in the last 168 hours. Diabetic: No results for  input(s): HGBA1C in the last 72 hours. No results for input(s): GLUCAP in the last 168 hours. Cardiac Enzymes: No results for input(s): CKTOTAL, CKMB, CKMBINDEX, TROPONINI in the last 168 hours. No results for input(s): PROBNP in the last 8760 hours. Coagulation Profile: No results for input(s): INR, PROTIME in the last 168 hours. Thyroid Function Tests: No results for input(s): TSH, T4TOTAL, FREET4, T3FREE, THYROIDAB in the last 72 hours. Lipid Profile: No results for input(s): CHOL, HDL, LDLCALC, TRIG, CHOLHDL, LDLDIRECT in the last 72 hours. Anemia Panel: Recent Labs    04/13/20 1256  VITAMINB12 545  FOLATE 10.3  FERRITIN 6*  TIBC 429  IRON 25*  RETICCTPCT 1.7   Urine analysis:    Component Value Date/Time   COLORURINE YELLOW 03/22/2007 2217   APPEARANCEUR CLOUDY (A) 03/22/2007 2217  LABSPEC 1.015 03/22/2007 2217   PHURINE 8.0 03/22/2007 2217   GLUCOSEU NEGATIVE 03/22/2007 2217   HGBUR MODERATE (A) 03/22/2007 2217   [place] NEGATIVE 03/22/2007 2217   [place] NEGATIVE 03/22/2007 2217   PROTEINUR NEGATIVE 03/22/2007 2217   UROBILINOGEN 0.2 03/22/2007 2217   NITRITE NEGATIVE 03/22/2007 2217   LEUKOCYTESUR NEGATIVE 03/22/2007 2217   Sepsis Labs: Invalid input(s): PROCALCITONIN, [place]  Microbiology: Recent Results (from the past 240 hour(s))  Resp Panel by RT-PCR (Flu A&B, Covid) Nasopharyngeal Swab     Status: None   Collection Time: 04/11/20 11:05 PM   Specimen: Nasopharyngeal Swab; Nasopharyngeal(NP) swabs in vial transport medium  Result Value Ref Range Status   SARS Coronavirus 2 by RT PCR NEGATIVE NEGATIVE Final    Comment: (NOTE) SARS-CoV-2 target nucleic acids are NOT DETECTED.  The SARS-CoV-2 RNA is generally detectable in upper respiratory specimens during the acute phase of infection. The lowest concentration of SARS-CoV-2 viral copies this assay can detect is 138 copies/mL. A negative result does not preclude SARS-Cov-2 infection and  should not be used as the sole basis for treatment or other patient management decisions. A negative result may occur with  improper specimen collection/handling, submission of specimen other than nasopharyngeal swab, presence of viral mutation(s) within the areas targeted by this assay, and inadequate number of viral copies(<138 copies/mL). A negative result must be combined with clinical observations, patient history, and epidemiological information. The expected result is Negative.  Fact Sheet for Patients:  EntrepreneurPulse.com.au  Fact Sheet for Healthcare Providers:  IncredibleEmployment.be  This test is no t yet approved or cleared by the [place] FDA and  has been authorized for detection and/or diagnosis of SARS-CoV-2 by FDA under an Emergency Use Authorization (EUA). This EUA will remain  in effect (meaning this test can be used) for the duration of the COVID-19 declaration under Section 564(b)(1) of the Act, 21 U.S.C.section 360bbb-3(b)(1), unless the authorization is terminated  or revoked sooner.       Influenza A by PCR NEGATIVE NEGATIVE Final   Influenza B by PCR NEGATIVE NEGATIVE Final    Comment: (NOTE) The Xpert Xpress SARS-CoV-2/FLU/RSV plus assay is intended as an aid in the diagnosis of influenza from Nasopharyngeal swab specimens and should not be used as a sole basis for treatment. Nasal washings and aspirates are unacceptable for Xpert Xpress SARS-CoV-2/FLU/RSV testing.  Fact Sheet for Patients: EntrepreneurPulse.com.au  Fact Sheet for Healthcare Providers: IncredibleEmployment.be  This test is not yet approved or cleared by the [place] FDA and has been authorized for detection and/or diagnosis of SARS-CoV-2 by FDA under an Emergency Use Authorization (EUA). This EUA will remain in effect (meaning this test can be used) for the duration of the COVID-19 declaration  under Section 564(b)(1) of the Act, 21 U.S.C. section 360bbb-3(b)(1), unless the authorization is terminated or revoked.  Performed at [facility], [place] [address], [place], [place] [address]     Radiology Studies: No results found.    Merilyn Pagan T. [place]  If 7PM-7AM, please contact night-coverage [email or web address] 04/13/2020, 5:30 PM

## 2020-04-13 NOTE — Progress Notes (Signed)
MD Gonfa notified of pt BP 188/114. Pt asymptomatic. No sign of distress. Orders in place. Will administer scheduled BP medication when due.

## 2020-04-14 LAB — RENAL FUNCTION PANEL
Albumin: 3.2 g/dL — ABNORMAL LOW (ref 3.5–5.0)
Anion gap: 8 (ref 5–15)
BUN: 8 mg/dL (ref 6–20)
CO2: 23 mmol/L (ref 22–32)
Calcium: 8.7 mg/dL — ABNORMAL LOW (ref 8.9–10.3)
Chloride: 108 mmol/L (ref 98–111)
Creatinine, Ser: 0.67 mg/dL (ref 0.44–1.00)
GFR, Estimated: >60 mL/min (ref >60)
Glucose, Bld: 93 mg/dL (ref 70–99)
Phosphorus: 3.5 mg/dL (ref 2.5–4.6)
Potassium: 3.7 mmol/L (ref 3.5–5.1)
Sodium: 139 mmol/L (ref 135–145)

## 2020-04-14 LAB — CBC
HCT: 36 % (ref 36.0–46.0)
Hemoglobin: 10.9 g/dL — ABNORMAL LOW (ref 12.0–15.0)
MCH: 27 pg (ref 26.0–34.0)
MCHC: 30.3 g/dL (ref 30.0–36.0)
MCV: 89.3 fL (ref 80.0–100.0)
Platelets: 302 10*3/uL (ref 150–400)
RBC: 4.03 MIL/uL (ref 3.87–5.11)
RDW: 14.1 % (ref 11.5–15.5)
WBC: 5.8 10*3/uL (ref 4.0–10.5)
nRBC: 0 % (ref 0.0–0.2)

## 2020-04-14 LAB — MAGNESIUM: Magnesium: 1.9 mg/dL (ref 1.7–2.4)

## 2020-04-14 MED ORDER — OLANZAPINE 5 MG PO TBDP
15.0000 mg | ORAL_TABLET | Freq: Every day | ORAL | Status: DC
  Administered 2020-04-14: 15 mg via ORAL
  Filled 2020-04-14: qty 3

## 2020-04-14 NOTE — Progress Notes (Signed)
PROGRESS NOTE  [name] ZHG:[id number] DOB: 03/23/1970   PCP: Pcp, No  Patient is from: [facility]  DOA: 04/11/2020 LOS: 2  Chief complaints: "attempted suicide by drinking bleach and anti-freeze"  Brief Narrative / Interim history: 50 year old F with PMH of psychosis, bipolar disorder and HTN brought to ED from [facility] due to "attempted suicide and homicide by trying to drink bleach and antifreeze and also trying to make a children drink".  She was also hostile and aggressive towards others.  Reportedly had visual hallucination thinking her deceased mother is still alive.  Reportedly, she claims to see people who are not there.  IVC was petitioned by his sister who gave the above report.   In ED, SBP elevated to 180s but has been restless during BP checks.  Hypokalemic to 3.1.  Mild metabolic acidosis without anion gap.  Hgb 10.7.  Lactic acid 0.9.  EKG NSR.  Poison control contacted by ED PA and recommended fomepizole infusion.  The next day, VBG, lactic acid, osmolality, Tylenol, salicylate, ethyl glycol, acetone, ethanol, isopropyl propylene and methanol within normal.  Fomepizole discontinued and she was cleared by poison control.  Psychiatry started psych medications and recommended inpatient psychiatric hospitalization.   Subjective: Seen and examined earlier this morning.  No major events overnight or this morning.  She is sitting up in bedside chair quietly.   She nods no to pain, shortness of breath, GI symptoms, suicidal or homicidal ideation.  She does not converse or engage.  However, she said "I am sorry" when she accidentally bumped to my arm during lung exam.  Objective: Vitals:   04/13/20 1350 04/13/20 2102 04/14/20 0603 04/14/20 1246  BP: (!) 188/114 (!) 159/96 124/78 (!) 168/106  Pulse: 68 62 (!) 51 (!) 59  Resp: 16 16 14 16   Temp: 97.9 F (36.6 C) 98.5 F (36.9 C) 98.1 F (36.7 C) (!) 97.5 F  (36.4 C)  TempSrc: Oral Oral Oral Oral  SpO2: 99% 100% 99% 100%  Weight:      Height:        Intake/Output Summary (Last 24 hours) at 04/14/2020 1703 Last data filed at 04/14/2020 1243 Gross per 24 hour  Intake 800 ml  Output --  Net 800 ml   Filed Weights   04/11/20 2351  Weight: 95.7 kg    Examination:  GENERAL: No apparent distress.  Nontoxic. HEENT: MMM.  Vision and hearing grossly intact.  NECK: Supple.  No apparent JVD.  RESP: On RA.  No IWOB.  Fair aeration bilaterally. CVS:  RRR. Heart sounds normal.  ABD/GI/GU: BS+. Abd soft, NTND.  MSK/EXT:  Moves extremities. No apparent deformity. No edema.  SKIN: no apparent skin lesion or wound NEURO: Awake, alert and oriented appropriately.  No apparent focal neuro deficit. PSYCH: Quiet.  Looks anxious.  No distress or agitation.  Procedures:  None  Microbiology summarized: [id number] and influenza PCR nonreactive.  Assessment & Plan: Alleged suicidal and homicidal attempts Intentional ingestion of bleach and ethylene glycol  History of bipolar disorder with psychosis, aggression and paranoia -Hemodynamically stable except for elevated BP.  No significant finding on EKG.  Mild non-anion gap acidosis -VBG, lactic acid, osmolality, Tylenol, salicylate, ethyl glycol, acetone, ethanol, isopropyl propylene and methanol, UDS within normal.  -Continue thiamine -Cleared by poison control -Appreciate recommendation by psychiatry  -Zyprexa and Depakote  -Admission to inpatient psychiatric hospital once medically cleared  -Initiate CPS referral for her children's safety  -IVC in  place 04/18/2020 per TOC.   Uncontrolled hypertension: Improved. -Continue home amlodipine -Hydralazine 25 mg p.o. every 6 hours as needed  Iron deficiency anemia: H&H stable.  Iron saturation 6%.  Ferritin 6.  TIBC 429. Recent Labs    07/28/19 1528 02/04/20 1520 04/11/20 2104 04/12/20 0016 04/14/20 0354  HGB 10.4* 11.1 10.7* 11.2* 10.9*   -IV Feraheme today -P.o. ferrous sulfate with bowel regimen   Hypokalemia: Resolved.  Mild non-anion gap metabolic acidosis: Resolved.  Noncompliance with medication  Class I obesity Body mass index is 33.04 kg/m.         DVT prophylaxis:  enoxaparin (LOVENOX) injection 40 mg Start: 04/12/20 1515 SCDs Start: 04/11/20 2211  Code Status: Full code Family Communication: Updated patient's sister at bedside. Level of care: Telemetry  Status is: Inpatient  Remains inpatient appropriate because:Unsafe d/c plan   Dispo: The patient is from: Home              Anticipated d/c is to: Inpatient psychiatric hospital              Patient currently is medically stable to d/c.   Difficult to place patient No           Consultants:  Poison control-signed off Psychiatry-following   Sch Meds:  Scheduled Meds: . amLODipine  10 mg Oral Daily  . divalproex  250 mg Oral Q12H  . docusate sodium  100 mg Oral Daily  . enoxaparin (LOVENOX) injection  40 mg Subcutaneous Daily  . ferrous sulfate  325 mg Oral BID WC  . loratadine  10 mg Oral Daily  . OLANZapine zydis  5 mg Oral QHS   And  . OLANZapine zydis  10 mg Oral QHS   Continuous Infusions:  PRN Meds:.acetaminophen, hydrALAZINE, polyethylene glycol, senna-docusate  Antimicrobials: Anti-infectives (From admission, onward)   None       I have personally reviewed the following labs and images: CBC: Recent Labs  Lab 04/11/20 2104 04/12/20 0016 04/14/20 0354  WBC 5.3 5.9 5.8  HGB 10.7* 11.2* 10.9*  HCT 34.8* 35.2* 36.0  MCV 87.7 86.3 89.3  PLT 322 310 302   BMP &GFR Recent Labs  Lab 04/11/20 2104 04/12/20 0016 04/12/20 0927 04/14/20 0354  NA 140 135  --  139  K 3.1* 3.1*  --  3.7  CL 109 106  --  108  CO2 20* 20*  --  23  GLUCOSE 84 99  --  93  BUN 9 8  --  8  CREATININE 0.54 0.67  --  0.67  CALCIUM 8.9 8.7*  --  8.7*  MG 2.0  --  1.9 1.9  PHOS  --   --   --  3.5   Estimated Creatinine  Clearance: 103.2 mL/min (by C-G formula based on SCr of 0.67 mg/dL). Liver & Pancreas: Recent Labs  Lab 04/11/20 2104 04/14/20 0354  AST 29  --   ALT 11  --   ALKPHOS 46  --   BILITOT 0.6  --   PROT 7.5  --   ALBUMIN 3.9 3.2*   No results for input(s): LIPASE, AMYLASE in the last 168 hours. No results for input(s): AMMONIA in the last 168 hours. Diabetic: No results for input(s): HGBA1C in the last 72 hours. No results for input(s): GLUCAP in the last 168 hours. Cardiac Enzymes: No results for input(s): CKTOTAL, CKMB, CKMBINDEX, TROPONINI in the last 168 hours. No results for input(s): PROBNP in the last 8760 hours. Coagulation Profile:  No results for input(s): INR, PROTIME in the last 168 hours. Thyroid Function Tests: No results for input(s): TSH, T4TOTAL, FREET4, T3FREE, THYROIDAB in the last 72 hours. Lipid Profile: No results for input(s): CHOL, HDL, LDLCALC, TRIG, CHOLHDL, LDLDIRECT in the last 72 hours. Anemia Panel: Recent Labs    04/13/20 1256  VITAMINB12 545  FOLATE 10.3  FERRITIN 6*  TIBC 429  IRON 25*  RETICCTPCT 1.7   Urine analysis:    Component Value Date/Time   COLORURINE YELLOW 03/22/2007 2217   APPEARANCEUR CLOUDY (A) 03/22/2007 2217   LABSPEC 1.015 03/22/2007 2217   PHURINE 8.0 03/22/2007 2217   GLUCOSEU NEGATIVE 03/22/2007 2217   HGBUR MODERATE (A) 03/22/2007 2217   [place] NEGATIVE 03/22/2007 2217   [place] NEGATIVE 03/22/2007 2217   PROTEINUR NEGATIVE 03/22/2007 2217   UROBILINOGEN 0.2 03/22/2007 2217   NITRITE NEGATIVE 03/22/2007 2217   LEUKOCYTESUR NEGATIVE 03/22/2007 2217   Sepsis Labs: Invalid input(s): PROCALCITONIN, [place]  Microbiology: Recent Results (from the past 240 hour(s))  Resp Panel by RT-PCR (Flu A&B, Covid) Nasopharyngeal Swab     Status: None   Collection Time: 04/11/20 11:05 PM   Specimen: Nasopharyngeal Swab; Nasopharyngeal(NP) swabs in vial transport medium  Result Value Ref Range Status   SARS  Coronavirus 2 by RT PCR NEGATIVE NEGATIVE Final    Comment: (NOTE) SARS-CoV-2 target nucleic acids are NOT DETECTED.  The SARS-CoV-2 RNA is generally detectable in upper respiratory specimens during the acute phase of infection. The lowest concentration of SARS-CoV-2 viral copies this assay can detect is 138 copies/mL. A negative result does not preclude SARS-Cov-2 infection and should not be used as the sole basis for treatment or other patient management decisions. A negative result may occur with  improper specimen collection/handling, submission of specimen other than nasopharyngeal swab, presence of viral mutation(s) within the areas targeted by this assay, and inadequate number of viral copies(<138 copies/mL). A negative result must be combined with clinical observations, patient history, and epidemiological information. The expected result is Negative.  Fact Sheet for Patients:  EntrepreneurPulse.com.au  Fact Sheet for Healthcare Providers:  IncredibleEmployment.be  This test is no t yet approved or cleared by the [place] FDA and  has been authorized for detection and/or diagnosis of SARS-CoV-2 by FDA under an Emergency Use Authorization (EUA). This EUA will remain  in effect (meaning this test can be used) for the duration of the COVID-19 declaration under Section 564(b)(1) of the Act, 21 U.S.C.section 360bbb-3(b)(1), unless the authorization is terminated  or revoked sooner.       Influenza A by PCR NEGATIVE NEGATIVE Final   Influenza B by PCR NEGATIVE NEGATIVE Final    Comment: (NOTE) The Xpert Xpress SARS-CoV-2/FLU/RSV plus assay is intended as an aid in the diagnosis of influenza from Nasopharyngeal swab specimens and should not be used as a sole basis for treatment. Nasal washings and aspirates are unacceptable for Xpert Xpress SARS-CoV-2/FLU/RSV testing.  Fact Sheet for  Patients: EntrepreneurPulse.com.au  Fact Sheet for Healthcare Providers: IncredibleEmployment.be  This test is not yet approved or cleared by the [place] FDA and has been authorized for detection and/or diagnosis of SARS-CoV-2 by FDA under an Emergency Use Authorization (EUA). This EUA will remain in effect (meaning this test can be used) for the duration of the COVID-19 declaration under Section 564(b)(1) of the Act, 21 U.S.C. section 360bbb-3(b)(1), unless the authorization is terminated or revoked.  Performed at [facility], [place] [address] [place], [place] [address]  Radiology Studies: No results found.    Nanie Dunkleberger T. [place]  If 7PM-7AM, please contact night-coverage [email or web address] 04/14/2020, 5:03 PM

## 2020-04-14 NOTE — TOC Progression Note (Signed)
Transition of Care [facility]) - Progression Note    Patient Details  Name: [name] MRN: [id number] Date of Birth: 07/12/1970  Transition of Care [facility]) CM/SW Contact  Purcell Mouton, RN Phone Number: 04/14/2020, 1:17 PM  Clinical Narrative:     Pt's sister Legal Guardian was called several times with no answer. [facility] offered pt a bed. Pt may be admitted in am after 8 AM. Pt was made aware. Pt asked where [facility] was. Explained to pt that it was in [place], [place]. Sheriff Office was called for transportation on 04/15/2020 after 8AM. Paperwork and clinicals placed in transport packet for [employer] and [employer].     Expected Discharge Plan: Psychiatric Hospital Barriers to Discharge: No Barriers Identified  Expected Discharge Plan and Services Expected Discharge Plan: [place] arrangements for the past 2 months: Single Family Home                                       Social Determinants of Health (SDOH) Interventions    Readmission Risk Interventions No flowsheet data found.

## 2020-04-14 NOTE — TOC Progression Note (Signed)
Transition of Care [facility]) - Progression Note    Patient Details  Name: Mignon Bechler MRN: [id number] Date of Birth: 09-19-1970  Transition of Care [facility]) CM/SW Contact  Purcell Mouton, RN Phone Number: 04/14/2020, 1:59 PM  Clinical Narrative:    Spoke with Legal Guardian Blane Ohara concerning bed offer at [facility] in [place], [place] for pt. Joaquim Lai, Legal Guardian agreed with pt going and states, We will take the offer. Telephone number for [facility], location and time pt will leave was given to [place], [place].    Expected Discharge Plan: Psychiatric Hospital Barriers to Discharge: No Barriers Identified  Expected Discharge Plan and Services Expected Discharge Plan: [place] arrangements for the past 2 months: Single Family Home                                       Social Determinants of Health (SDOH) Interventions    Readmission Risk Interventions No flowsheet data found.

## 2020-04-15 DIAGNOSIS — E872 Acidosis: Secondary | ICD-10-CM

## 2020-04-15 MED ORDER — ACETAMINOPHEN 325 MG PO TABS
650.0000 mg | ORAL_TABLET | Freq: Four times a day (QID) | ORAL | Status: DC | PRN
Start: 2020-04-15 — End: 2021-02-01

## 2020-04-15 MED ORDER — POLYETHYLENE GLYCOL 3350 17 G PO PACK
17.0000 g | PACK | Freq: Two times a day (BID) | ORAL | 0 refills | Status: DC | PRN
Start: 2020-04-15 — End: 2021-02-01

## 2020-04-15 MED ORDER — FERROUS SULFATE 325 (65 FE) MG PO TABS: 325.0000 mg | ORAL_TABLET | Freq: Two times a day (BID) | ORAL | 3 refills | Status: DC

## 2020-04-15 MED ORDER — DOCUSATE SODIUM 100 MG PO CAPS: 100.0000 mg | ORAL_CAPSULE | Freq: Every day | ORAL | 0 refills | Status: DC

## 2020-04-15 MED ORDER — AMLODIPINE BESYLATE 10 MG PO TABS: 10.0000 mg | ORAL_TABLET | Freq: Every day | ORAL | 3 refills | Status: DC

## 2020-04-15 MED ORDER — HYDRALAZINE HCL 25 MG PO TABS: 25.0000 mg | ORAL_TABLET | Freq: Four times a day (QID) | ORAL | Status: DC | PRN

## 2020-04-15 MED ORDER — LORATADINE 10 MG PO TABS: 10.0000 mg | ORAL_TABLET | Freq: Every day | ORAL | Status: DC

## 2020-04-15 MED ORDER — SENNOSIDES-DOCUSATE SODIUM 8.6-50 MG PO TABS: 1.0000 | ORAL_TABLET | Freq: Two times a day (BID) | ORAL | Status: DC | PRN

## 2020-04-15 MED ORDER — DIVALPROEX SODIUM 250 MG PO DR TAB: 250.0000 mg | DELAYED_RELEASE_TABLET | Freq: Two times a day (BID) | ORAL | Status: DC

## 2020-04-15 NOTE — Plan of Care (Signed)
  Problem: Clinical Measurements: Goal: Diagnostic test results will improve Outcome: Adequate for Discharge   Problem: Clinical Measurements: Goal: Ability to maintain clinical measurements within normal limits will improve Outcome: Adequate for Discharge

## 2020-04-15 NOTE — Discharge Summary (Signed)
Physician Discharge Summary  [name] ZCH:[id number] DOB: Nov 13, 1970 DOA: 04/11/2020  PCP: Merryl Hacker, No  Admit date: 04/11/2020 Discharge date: 04/15/2020  Admitted From: Home Disposition: Inpatient psychiatric hospital at [facility]  Recommendations for Outpatient Follow-up:  1. Follow ups as below. 2. Please obtain CBC/BMP/Mag at follow up 3. Please follow up on the following pending results: None   Discharge Condition: Stable CODE STATUS: Full code   Hospital Course: 50 year old F with PMH of psychosis, bipolar disorder and HTN brought to ED from [facility] due to "attempted suicide and homicide by trying to drink bleach and antifreeze and also trying to make a children drink".  She was also hostile and aggressive towards others.  Reportedly had visual hallucination thinking her deceased mother is still alive.  Reportedly, she claims to see people who are not there.  IVC was petitioned by his sister who gave the above report.   In ED, SBP elevated to 180s but has been restless during BP checks.  Hypokalemic to 3.1.  Mild metabolic acidosis without anion gap.  Hgb 10.7.  Lactic acid 0.9.  EKG NSR.  Poison control contacted by ED PA and recommended fomepizole infusion.  The next day, VBG, lactic acid, osmolality, Tylenol, salicylate, ethyl glycol, acetone, ethanol,isopropyl propylene and methanol within normal.  Fomepizole discontinued and she was cleared by poison control. Psychiatry started psych medications (Zyprexa and Depakote) and recommended inpatient psychiatric hospitalization.  Patient also has iron deficiency anemia.  She received IV Feraheme and continued on home ferrous sulfate with bowel regimen.   See individual problem list below for more on hospital course.  Discharge Diagnoses:  Alleged suicidal and homicidal attempts Intentionalingestion of bleach andethylene glycol History of bipolar disorder with psychosis, aggression and  paranoia -Hemodynamically stable except for elevated BP.  No significant finding on EKG.  Mild non-anion gap acidosis -VBG, lactic acid, osmolality, Tylenol, salicylate, ethyl glycol, acetone, ethanol,isopropyl propylene and methanol, UDS within normal.  -Continue thiamine -Cleared by poison control -Appreciate recommendation by psychiatry             -Zyprexa and Depakote             -Admission to inpatient psychiatric hospital once medically cleared             -Initiate CPS referral for her children's safety             -IVC in place 04/18/2020 per TOC.   Uncontrolled hypertension:  Normotensive. -Continue home amlodipine -Hydralazine 25 mg p.o. every 6 hours as needed  Iron deficiency anemia: H&H stable.  Iron saturation 6%.  Ferritin 6.  TIBC 429. Recent Labs (within last 365 days)         Recent Labs    07/28/19 1528 02/04/20 1520 04/11/20 2104 04/12/20 0016 04/14/20 0354  HGB 10.4* 11.1 10.7* 11.2* 10.9*    -IV Feraheme on 4/14. -P.o. ferrous sulfate with bowel regimen   Hypokalemia: Resolved.  Mild non-anion gap metabolic acidosis: Resolved.  Noncompliance with medication: counsel  Class I obesity Body mass index is 33.04 kg/m.  Encourage lifestyle change to lose weight.          Discharge Exam: Vitals:   04/14/20 2121 04/15/20 0617  BP: (!) 159/96 122/78  Pulse: 73 (!) 58  Resp: 18 16  Temp: 98.5 F (36.9 C) 97.9 F (36.6 C)  SpO2: 100% 98%    GENERAL: No apparent distress.  Nontoxic. HEENT: MMM.  Vision and hearing grossly intact.  NECK: Supple.  No apparent JVD.  RESP: On RA.  No IWOB.  Fair aeration bilaterally. CVS:  RRR. Heart sounds normal.  ABD/GI/GU: Bowel sounds present. Soft. Non tender.  MSK/EXT:  Moves extremities. No apparent deformity. No edema.  SKIN: no apparent skin lesion or wound NEURO: Awake and alert.  Not able to assess orientation.  No apparent focal neuro deficit. PSYCH: Quiet.  Somewhat anxious.  No  distress or agitation.  Discharge Instructions  Discharge Instructions    Diet - low sodium heart healthy   Complete by: As directed    Increase activity slowly   Complete by: As directed      Allergies as of 04/15/2020      Reactions   Latex    Risperdal [risperidone] Other (See Comments)   Caused eyelids to relax requiring treatment      Medication List    STOP taking these medications   Invega Sustenna 234 MG/1.5ML Susy injection Generic drug: paliperidone   potassium chloride 10 MEQ tablet Commonly known as: KLOR-CON     TAKE these medications   acetaminophen 325 MG tablet Commonly known as: TYLENOL Take 2 tablets (650 mg total) by mouth every 6 (six) hours as needed for headache or fever.   amLODipine 10 MG tablet Commonly known as: NORVASC Take 1 tablet (10 mg total) by mouth daily.   Blood Pressure Monitor Devi 1 Bag by Does not apply route 3 (three) times daily.   divalproex 250 MG DR tablet Commonly known as: DEPAKOTE Take 1 tablet (250 mg total) by mouth every 12 (twelve) hours. What changed:   how much to take  when to take this   docusate sodium 100 MG capsule Commonly known as: COLACE Take 1 capsule (100 mg total) by mouth daily.   ferrous sulfate 325 (65 FE) MG tablet Take 1 tablet (325 mg total) by mouth 2 (two) times daily with a meal. What changed: when to take this   hydrALAZINE 25 MG tablet Commonly known as: APRESOLINE Take 1 tablet (25 mg total) by mouth every 6 (six) hours as needed (SBP>160 or DBP>110).   loratadine 10 MG tablet Commonly known as: CLARITIN Take 1 tablet (10 mg total) by mouth daily.   olanzapine zydis 15 MG disintegrating tablet Commonly known as: ZYPREXA Take 1 tablet (15 mg total) by mouth at bedtime. What changed: Another medication with the same name was removed. Continue taking this medication, and follow the directions you see here.   polyethylene glycol 17 g packet Commonly known as: MIRALAX /  GLYCOLAX Take 17 g by mouth 2 (two) times daily as needed for mild constipation.   senna-docusate 8.6-50 MG tablet Commonly known as: Senokot-S Take 1 tablet by mouth 2 (two) times daily as needed for moderate constipation.       Consultations:  Poison control  Psychiatry  Procedures/Studies:    No results found.     The results of significant diagnostics from this hospitalization (including imaging, microbiology, ancillary and laboratory) are listed below for reference.     Microbiology: Recent Results (from the past 240 hour(s))  Resp Panel by RT-PCR (Flu A&B, Covid) Nasopharyngeal Swab     Status: None   Collection Time: 04/11/20 11:05 PM   Specimen: Nasopharyngeal Swab; Nasopharyngeal(NP) swabs in vial transport medium  Result Value Ref Range Status   SARS Coronavirus 2 by RT PCR NEGATIVE NEGATIVE Final    Comment: (NOTE) SARS-CoV-2 target nucleic acids are NOT DETECTED.  The SARS-CoV-2 RNA is  generally detectable in upper respiratory specimens during the acute phase of infection. The lowest concentration of SARS-CoV-2 viral copies this assay can detect is 138 copies/mL. A negative result does not preclude SARS-Cov-2 infection and should not be used as the sole basis for treatment or other patient management decisions. A negative result may occur with  improper specimen collection/handling, submission of specimen other than nasopharyngeal swab, presence of viral mutation(s) within the areas targeted by this assay, and inadequate number of viral copies(<138 copies/mL). A negative result must be combined with clinical observations, patient history, and epidemiological information. The expected result is Negative.  Fact Sheet for Patients:  EntrepreneurPulse.com.au  Fact Sheet for Healthcare Providers:  IncredibleEmployment.be  This test is no t yet approved or cleared by the [place] FDA and  has been authorized for  detection and/or diagnosis of SARS-CoV-2 by FDA under an Emergency Use Authorization (EUA). This EUA will remain  in effect (meaning this test can be used) for the duration of the COVID-19 declaration under Section 564(b)(1) of the Act, 21 U.S.C.section 360bbb-3(b)(1), unless the authorization is terminated  or revoked sooner.       Influenza A by PCR NEGATIVE NEGATIVE Final   Influenza B by PCR NEGATIVE NEGATIVE Final    Comment: (NOTE) The Xpert Xpress SARS-CoV-2/FLU/RSV plus assay is intended as an aid in the diagnosis of influenza from Nasopharyngeal swab specimens and should not be used as a sole basis for treatment. Nasal washings and aspirates are unacceptable for Xpert Xpress SARS-CoV-2/FLU/RSV testing.  Fact Sheet for Patients: EntrepreneurPulse.com.au  Fact Sheet for Healthcare Providers: IncredibleEmployment.be  This test is not yet approved or cleared by the [place] FDA and has been authorized for detection and/or diagnosis of SARS-CoV-2 by FDA under an Emergency Use Authorization (EUA). This EUA will remain in effect (meaning this test can be used) for the duration of the COVID-19 declaration under Section 564(b)(1) of the Act, 21 U.S.C. section 360bbb-3(b)(1), unless the authorization is terminated or revoked.  Performed at [facility], [place] [address] [place], [place] [address]      Labs:  CBC: Recent Labs  Lab 04/11/20 2104 04/12/20 0016 04/14/20 0354  WBC 5.3 5.9 5.8  HGB 10.7* 11.2* 10.9*  HCT 34.8* 35.2* 36.0  MCV 87.7 86.3 89.3  PLT 322 310 302   BMP &GFR Recent Labs  Lab 04/11/20 2104 04/12/20 0016 04/12/20 0927 04/14/20 0354  NA 140 135  --  139  K 3.1* 3.1*  --  3.7  CL 109 106  --  108  CO2 20* 20*  --  23  GLUCOSE 84 99  --  93  BUN 9 8  --  8  CREATININE 0.54 0.67  --  0.67  CALCIUM 8.9 8.7*  --  8.7*  MG 2.0  --  1.9 1.9  PHOS  --   --   --  3.5   Estimated  Creatinine Clearance: 103.2 mL/min (by C-G formula based on SCr of 0.67 mg/dL). Liver & Pancreas: Recent Labs  Lab 04/11/20 2104 04/14/20 0354  AST 29  --   ALT 11  --   ALKPHOS 46  --   BILITOT 0.6  --   PROT 7.5  --   ALBUMIN 3.9 3.2*   No results for input(s): LIPASE, AMYLASE in the last 168 hours. No results for input(s): AMMONIA in the last 168 hours. Diabetic: No results for input(s): HGBA1C in the last 72 hours. No results for input(s): GLUCAP  in the last 168 hours. Cardiac Enzymes: No results for input(s): CKTOTAL, CKMB, CKMBINDEX, TROPONINI in the last 168 hours. No results for input(s): PROBNP in the last 8760 hours. Coagulation Profile: No results for input(s): INR, PROTIME in the last 168 hours. Thyroid Function Tests: No results for input(s): TSH, T4TOTAL, FREET4, T3FREE, THYROIDAB in the last 72 hours. Lipid Profile: No results for input(s): CHOL, HDL, LDLCALC, TRIG, CHOLHDL, LDLDIRECT in the last 72 hours. Anemia Panel: Recent Labs    04/13/20 1256  VITAMINB12 545  FOLATE 10.3  FERRITIN 6*  TIBC 429  IRON 25*  RETICCTPCT 1.7   Urine analysis:    Component Value Date/Time   COLORURINE YELLOW 03/22/2007 2217   APPEARANCEUR CLOUDY (A) 03/22/2007 2217   LABSPEC 1.015 03/22/2007 2217   PHURINE 8.0 03/22/2007 2217   GLUCOSEU NEGATIVE 03/22/2007 2217   HGBUR MODERATE (A) 03/22/2007 2217   [place] NEGATIVE 03/22/2007 2217   [place] NEGATIVE 03/22/2007 2217   PROTEINUR NEGATIVE 03/22/2007 2217   UROBILINOGEN 0.2 03/22/2007 2217   NITRITE NEGATIVE 03/22/2007 2217   LEUKOCYTESUR NEGATIVE 03/22/2007 2217   Sepsis Labs: Invalid input(s): PROCALCITONIN, LACTICIDVEN   Time coordinating discharge: 35 minutes  SIGNED:  Mercy Riding, MD  [facility] 04/15/2020, 7:46 AM  If 7PM-7AM, please contact night-coverage [email or web address]

## 2020-05-13 ENCOUNTER — Ambulatory Visit (INDEPENDENT_AMBULATORY_CARE_PROVIDER_SITE_OTHER): Payer: Medicaid Other | Admitting: Primary Care

## 2020-06-03 ENCOUNTER — Ambulatory Visit (HOSPITAL_COMMUNITY): Payer: Medicaid Other | Admitting: Psychiatry

## 2020-06-08 ENCOUNTER — Ambulatory Visit (INDEPENDENT_AMBULATORY_CARE_PROVIDER_SITE_OTHER): Payer: Medicaid Other | Admitting: Primary Care

## 2020-06-08 ENCOUNTER — Other Ambulatory Visit: Payer: Self-pay

## 2020-06-08 ENCOUNTER — Encounter (INDEPENDENT_AMBULATORY_CARE_PROVIDER_SITE_OTHER): Payer: Self-pay | Admitting: Primary Care

## 2020-06-08 VITALS — BP 168/111 | HR 75 | Temp 97.7°F | Ht 67.0 in | Wt 236.4 lb

## 2020-06-08 DIAGNOSIS — I10 Essential (primary) hypertension: Secondary | ICD-10-CM | POA: Diagnosis not present

## 2020-06-08 DIAGNOSIS — Z760 Encounter for issue of repeat prescription: Secondary | ICD-10-CM | POA: Diagnosis not present

## 2020-06-08 MED ORDER — LOSARTAN POTASSIUM-HCTZ 100-25 MG PO TABS: 1.0000 | ORAL_TABLET | Freq: Every day | ORAL | 3 refills | Status: DC

## 2020-06-08 MED ORDER — AMLODIPINE BESYLATE 10 MG PO TABS: 10.0000 mg | ORAL_TABLET | Freq: Every day | ORAL | 3 refills | Status: DC

## 2020-06-08 MED ORDER — HYDRALAZINE HCL 25 MG PO TABS: 25.0000 mg | ORAL_TABLET | Freq: Four times a day (QID) | ORAL | Status: DC | PRN
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
## 2020-06-08 NOTE — Patient Instructions (Signed)

## 2020-06-09 ENCOUNTER — Telehealth (INDEPENDENT_AMBULATORY_CARE_PROVIDER_SITE_OTHER): Payer: Self-pay

## 2020-06-09 NOTE — Telephone Encounter (Signed)
Medication sent 06/08/20

## 2020-06-09 NOTE — Telephone Encounter (Signed)
Copied from [place] [id number]. Topic: General - Other >> Jun 08, 2020 12:10 PM Leward Quan A wrote: Reason for CRM: Patient called in from Pharmacy they did not receive Rx for amLODipine (NORVASC) 10 MG tablet, hydrALAZINE (APRESOLINE) 25 MG tablet per snap shot Rx was printed and not sent Please advise

## 2020-06-13 ENCOUNTER — Ambulatory Visit (INDEPENDENT_AMBULATORY_CARE_PROVIDER_SITE_OTHER): Payer: Medicaid Other | Admitting: Primary Care

## 2020-07-02 ENCOUNTER — Encounter (HOSPITAL_COMMUNITY): Payer: Self-pay | Admitting: Emergency Medicine

## 2020-07-02 ENCOUNTER — Other Ambulatory Visit: Payer: Self-pay

## 2020-07-02 ENCOUNTER — Ambulatory Visit (HOSPITAL_COMMUNITY)
Admission: EM | Admit: 2020-07-02 | Discharge: 2020-07-02 | Disposition: A | Discharge status: Other Acute Inpatient Hospital | Payer: Medicaid Other

## 2020-07-02 ENCOUNTER — Emergency Department (HOSPITAL_COMMUNITY)
Admission: EM | Admit: 2020-07-02 | Discharge: 2020-07-02 | Discharge status: Against Medical Advice | Payer: Medicaid Other | Attending: Medical | Admitting: Medical

## 2020-07-02 ENCOUNTER — Encounter (HOSPITAL_COMMUNITY): Payer: Self-pay | Admitting: Physician Assistant

## 2020-07-02 DIAGNOSIS — Z5321 Procedure and treatment not carried out due to patient leaving prior to being seen by health care provider: Secondary | ICD-10-CM | POA: Diagnosis not present

## 2020-07-02 DIAGNOSIS — H57813 Brow ptosis, bilateral: Secondary | ICD-10-CM | POA: Insufficient documentation

## 2020-07-02 MED ORDER — TETRACAINE HCL 0.5 % OP SOLN: 2.0000 [drp] | Freq: Once | OPHTHALMIC | Status: DC

## 2020-07-02 MED ORDER — FLUORESCEIN SODIUM 1 MG OP STRP: 1.0000 | ORAL_STRIP | Freq: Once | OPHTHALMIC | Status: DC

## 2020-07-02 NOTE — ED Triage Notes (Signed)
Pt reports bilateral eyes twitching x 1 week.  States at times her eyelids close and she has to hold them open with her hands.  Also reports drainage to bilateral eyes.  Sent from [facility].  Denies pain.  Intermittent itching to eyes.

## 2020-07-02 NOTE — ED Provider Notes (Signed)
Emergency Medicine Provider Triage Evaluation Note  [name] , a 50 y.o. female  was evaluated in triage.  Pt complains of bilateral eye twitching and an "atmosphere" in her left eye for the past week. She feels like she has difficulty opening up her eye in the morning however denies crusting shut. She also reports she is not having some difficulty reading out of the left eye however denies blurry or double vision. She went to [facility] today and was sent here for further eval.   Review of Systems  Positive: + left eye twitching, difficulty seeing Negative: - eye pain  Physical Exam  BP (!) 150/89 (BP Location: Right Arm)   Pulse 76   Temp 98.6 F (37 C) (Oral)   Resp 16   SpO2 100%  Gen:   Awake, no distress   Resp:  Normal effort  MSK:   Moves extremities without difficulty  Other:    Medical Decision Making  Medically screening exam initiated at 4:55 PM.  Appropriate orders placed.  [name] was informed that the remainder of the evaluation will be completed by another provider, this initial triage assessment does not replace that evaluation, and the importance of remaining in the ED until their evaluation is complete.     Vanessa Maize, PA-C 07/02/20 1657    Vanessa Natal, MD 07/02/20 [phone]

## 2020-07-02 NOTE — ED Triage Notes (Signed)
Pt also appears to have Lt sided mouth droop. PA in room to assess Pt .

## 2020-07-02 NOTE — ED Triage Notes (Signed)
Pt reports eye problem with out injury that started a week ago. Pt reports she used to wear glasses.

## 2020-07-02 NOTE — ED Notes (Signed)
Pt stated she was leaving and she would come back early in the morning.

## 2020-07-03 ENCOUNTER — Encounter (HOSPITAL_COMMUNITY): Payer: Self-pay

## 2020-07-03 ENCOUNTER — Emergency Department (HOSPITAL_COMMUNITY): Payer: Medicaid Other

## 2020-07-03 ENCOUNTER — Emergency Department (HOSPITAL_COMMUNITY)
Admission: EM | Admit: 2020-07-03 | Discharge: 2020-07-03 | Disposition: A | Discharge status: Home / Self Care | Payer: Medicaid Other | Attending: Emergency Medicine | Admitting: Emergency Medicine

## 2020-07-03 ENCOUNTER — Other Ambulatory Visit: Payer: Self-pay

## 2020-07-03 DIAGNOSIS — Z91040 Latex allergy status: Secondary | ICD-10-CM | POA: Insufficient documentation

## 2020-07-03 DIAGNOSIS — I10 Essential (primary) hypertension: Secondary | ICD-10-CM | POA: Diagnosis not present

## 2020-07-03 DIAGNOSIS — H538 Other visual disturbances: Secondary | ICD-10-CM | POA: Insufficient documentation

## 2020-07-03 DIAGNOSIS — Z79899 Other long term (current) drug therapy: Secondary | ICD-10-CM | POA: Diagnosis not present

## 2020-07-03 DIAGNOSIS — R29810 Facial weakness: Secondary | ICD-10-CM | POA: Diagnosis present

## 2020-07-03 DIAGNOSIS — H029 Unspecified disorder of eyelid: Secondary | ICD-10-CM

## 2020-07-03 DIAGNOSIS — H539 Unspecified visual disturbance: Secondary | ICD-10-CM

## 2020-07-03 MED ORDER — ASPIRIN EC 325 MG PO TBEC: 325.0000 mg | DELAYED_RELEASE_TABLET | Freq: Every day | ORAL | 0 refills | Status: DC

## 2020-07-03 NOTE — ED Provider Notes (Signed)
[facility] EMERGENCY DEPARTMENT Provider Note   CSN: [id number] Arrival date & time: 07/03/20  0815     History No chief complaint on file.   [name] is a 50 y.o. female.  HPI     50 year old female comes in a chief complaint of eye twitching, facial droop.  She reports that last week she started having excessive eye twitching.  The symptoms affect both of her eyes.  She also feels like her vision has gotten worse.  She is not able to read small handwriting's like she normally was able to.  Additionally, she also reports facial droop that she has appreciated.  She has history of hypertension but denies any stroke.  Past Medical History:  Diagnosis Date   HTN (hypertension) 04/11/2020    Patient Active Problem List   Diagnosis Date Noted   Suicidal behavior with attempted self-injury ([place]) 04/12/2020   Ingestion of toxic substance 04/11/2020   HTN (hypertension) 04/11/2020   Bipolar affective disorder, manic, severe, with psychotic behavior ([place]) 07/29/2019   Bipolar affective disorder, current episode manic ([place]) 05/18/2018   Bipolar affective disorder, current episode manic with psychotic symptoms ([place]) 05/18/2018   Uterine fibroid 02/06/2017   Bipolar affective disorder, mixed, severe, with psychotic behavior ([place]) 12/28/2016   Bipolar I disorder, current or most recent episode manic, with psychotic features ([place]) 12/28/2016   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    Past Surgical History:  Procedure Laterality Date   CESAREAN SECTION  2002, 2009     OB History     Gravida  3   Para  3   Term  3   Preterm      AB      Living         SAB      IAB      Ectopic      Multiple      Live Births  3           Family History  Problem Relation Age of Onset   Breast cancer Mother        Deceased, 45   Hypertension Mother    Arthritis Father    Hypertension Father    Healthy Son     Asthma Daughter    Diabetes type II Daughter     Social History   Tobacco Use   Smoking status: Never   Smokeless tobacco: Never  Vaping Use   Vaping Use: Never used  Substance Use Topics   Alcohol use: No   Drug use: No    Home Medications Prior to Admission medications   Medication Sig Start Date End Date Taking? Authorizing Provider  aspirin EC 325 MG tablet Take 1 tablet (325 mg total) by mouth daily. 07/03/20  Yes Domenic Moras, PA-C  acetaminophen (TYLENOL) 325 MG tablet Take 2 tablets (650 mg total) by mouth every 6 (six) hours as needed for headache or fever. 04/15/20   Mercy Riding, MD  amLODipine (NORVASC) 10 MG tablet Take 1 tablet (10 mg total) by mouth daily. 06/08/20   [name], NP  Blood Pressure Monitor DEVI 1 Bag by Does not apply route 3 (three) times daily. 02/04/20   [name], NP  divalproex (DEPAKOTE) 250 MG DR tablet Take 1 tablet (250 mg total) by mouth every 12 (twelve) hours. 04/15/20   Mercy Riding, MD  ferrous sulfate 325 (65 FE) MG tablet Take 1 tablet (  325 mg total) by mouth 2 (two) times daily with a meal. 04/15/20   Mercy Riding, MD  hydrALAZINE (APRESOLINE) 25 MG tablet Take 1 tablet (25 mg total) by mouth every 6 (six) hours as needed (SBP>160 or DBP>110). 06/08/20   [name], NP  loratadine (CLARITIN) 10 MG tablet Take 1 tablet (10 mg total) by mouth daily. 04/15/20   Mercy Riding, MD  losartan-hydrochlorothiazide (HYZAAR) 100-25 MG tablet Take 1 tablet by mouth daily. 06/08/20   [name], NP  polyethylene glycol (MIRALAX / GLYCOLAX) 17 g packet Take 17 g by mouth 2 (two) times daily as needed for mild constipation. 04/15/20   Mercy Riding, MD  senna-docusate (SENOKOT-S) 8.6-50 MG tablet Take 1 tablet by mouth 2 (two) times daily as needed for moderate constipation. 04/15/20   Mercy Riding, MD    Allergies    Latex and Risperdal [risperidone]  Review of Systems   Review of Systems  Constitutional:  Positive for  activity change.  Eyes:  Positive for visual disturbance.  Gastrointestinal:  Negative for nausea and vomiting.  Neurological:  Positive for facial asymmetry. Negative for speech difficulty, weakness, numbness and headaches.  All other systems reviewed and are negative.  Physical Exam Updated Vital Signs BP (!) 128/102 (BP Location: Right Arm)   Pulse 64   Temp 98.7 F (37.1 C) (Oral)   Resp 16   SpO2 100%   Physical Exam Vitals and nursing note reviewed.  Constitutional:      Appearance: She is well-developed.  HENT:     Head: Atraumatic.  Eyes:     Extraocular Movements: Extraocular movements intact.     Pupils: Pupils are equal, round, and reactive to light.     Comments: Visual field cuts are intact  Cardiovascular:     Rate and Rhythm: Normal rate.  Pulmonary:     Effort: Pulmonary effort is normal.  Musculoskeletal:     Cervical back: Normal range of motion and neck supple.  Skin:    General: Skin is warm and dry.  Neurological:     Mental Status: She is alert and oriented to person, place, and time.     Cranial Nerves: No cranial nerve deficit.     Sensory: No sensory deficit.     Motor: No weakness.     Coordination: Coordination normal.    ED Results / Procedures / Treatments   Labs (all labs ordered are listed, but only abnormal results are displayed) Labs Reviewed - No data to display  EKG None  Radiology CT Head Wo Contrast  Result Date: 07/03/2020 CLINICAL DATA:  Neuro deficit.  Suspect acute stroke. EXAM: CT HEAD WITHOUT CONTRAST TECHNIQUE: Contiguous axial images were obtained from the base of the skull through the vertex without intravenous contrast. COMPARISON:  None. FINDINGS: Brain: There is mild central and cortical atrophy. There is no intra or extra-axial fluid collection or mass lesion. The basilar cisterns and ventricles have a normal appearance. There is no CT evidence for acute infarction or hemorrhage. Vascular: No hyperdense vessel or  unexpected calcification. Skull: Normal. Negative for fracture or focal lesion. Sinuses/Orbits: No acute finding. Other: None. IMPRESSION: No evidence for acute intracranial abnormality.  Mild atrophy. Electronically Signed   By: Nolon Nations M.D.   On: 07/03/2020 12:11    Procedures Procedures   Medications Ordered in ED Medications - No data to display  ED Course  I have reviewed the triage vital signs and the nursing  notes.  Pertinent labs & imaging results that were available during my care of the patient were reviewed by me and considered in my medical decision making (see chart for details).    MDM Rules/Calculators/A&P                          50 year old female comes in a chief complaint of vision change, intermittent episodes of excessive blinking and facial droop.  She has history of hypertension.  CT scan of the brain is normal.  Initial thought was that patient might be having Tourette's.  She denies any excess stress.  Other possibility is anxiety driven blinking.  Initially patient denied any history of psych issues, but upon further review of her chart, which actually occurred at the time of discharge, it is noted that she has blepharospasm in her differential.  I suspect that her symptoms are likely psychogenic after reading this information.  Unfortunately, this information was uncovered after patient was already up for discharge.  We had requested that she see neurology for neurological work-up.   Final Clinical Impression(s) / ED Diagnoses Final diagnoses:  Visual disturbance  Eyelid abnormality    Rx / DC Orders ED Discharge Orders          Ordered    aspirin EC 325 MG tablet  Daily        07/03/20 1246             Varney Biles, MD 07/03/20 1417

## 2020-07-03 NOTE — ED Triage Notes (Signed)
Patient was sent yesterday from [facility] and states that she left prior to evaluation. Patient complains of random blinking of bilateral eye lids and making it difficult to keep eyes open. No trauma, no pain.

## 2020-07-03 NOTE — Discharge Instructions (Addendum)
Return to the ER if you start having sudden drooping of the face, slurred speech, one-sided numbness, weakness or tingling, one-sided vision loss or dizziness.  Otherwise, please call the neurologist at the number provided for the earliest appointment.

## 2020-07-06 ENCOUNTER — Ambulatory Visit (INDEPENDENT_AMBULATORY_CARE_PROVIDER_SITE_OTHER): Payer: Medicaid Other | Admitting: Primary Care

## 2020-07-06 ENCOUNTER — Other Ambulatory Visit: Payer: Self-pay

## 2020-07-06 ENCOUNTER — Encounter (INDEPENDENT_AMBULATORY_CARE_PROVIDER_SITE_OTHER): Payer: Self-pay | Admitting: Primary Care

## 2020-07-06 VITALS — BP 128/83 | HR 57 | Temp 97.5°F | Wt 234.8 lb

## 2020-07-06 DIAGNOSIS — I10 Essential (primary) hypertension: Secondary | ICD-10-CM
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
## 2020-08-15 ENCOUNTER — Ambulatory Visit (HOSPITAL_COMMUNITY): Payer: Medicaid Other | Admitting: Psychiatry

## 2020-11-18 ENCOUNTER — Telehealth (INDEPENDENT_AMBULATORY_CARE_PROVIDER_SITE_OTHER): Payer: Self-pay | Admitting: Primary Care

## 2020-11-18 DIAGNOSIS — I10 Essential (primary) hypertension: Secondary | ICD-10-CM

## 2020-11-18 NOTE — Telephone Encounter (Signed)
Medication: losartan-hydrochlorothiazide (HYZAAR) 100-25 MG tablet [[id number]]  Patient has been without the medication for 1.5 weeks. Pt reports that she is taking stated taking 2 pills instead of one pill as prescribed. Pt has now returned per day.  Has the patient contacted their pharmacy? YES Pharmacy advised the patient to call the office (Agent: If no, request that the patient contact the pharmacy for the refill. If patient does not wish to contact the pharmacy document the reason why and proceed with request.) (Agent: If yes, when and what did the pharmacy advise?)  Preferred Pharmacy (with phone number or street name): [facility], [place] [address] Phone: [phone] Fax: [phone] Hours: Not open 24 hours   Has the patient been seen for an appointment in the last year OR does the patient have an upcoming appointment? YES 07/16/20  Agent: Please be advised that RX refills may take up to 3 business days. We ask that you follow-up with your pharmacy.

## 2020-11-18 NOTE — Telephone Encounter (Addendum)
Patient called, left VM to return the call to the office. She will need to be made aware of the below and to give BP readings, if she has them and to clarify how she's taking the medication. Transfer to NT when patient returns call.  [employer] representative (formerly [employer]) called and spoke to Annawan, Merchant navy officer about the refill requested. She says it's in the system, the patient picked up a 90 day supply on 09/03/20, so the earliest to pick up is on Monday 11/21/20 for the insurance to pay. She says the patient is able to come in and purchase some on her own before Monday. Last refill 06/08/20 #90/3 refills.

## 2020-11-18 NOTE — Telephone Encounter (Signed)
FYI

## 2021-01-06 ENCOUNTER — Ambulatory Visit (INDEPENDENT_AMBULATORY_CARE_PROVIDER_SITE_OTHER): Payer: Medicaid Other | Admitting: Primary Care

## 2021-01-17 ENCOUNTER — Ambulatory Visit (HOSPITAL_COMMUNITY)
Admission: EM | Admit: 2021-01-17 | Discharge: 2021-01-17 | Disposition: A | Discharge status: Home / Self Care | Payer: Medicaid Other | Attending: Family Medicine | Admitting: Family Medicine

## 2021-01-17 ENCOUNTER — Encounter (HOSPITAL_COMMUNITY): Payer: Self-pay

## 2021-01-17 ENCOUNTER — Other Ambulatory Visit: Payer: Self-pay

## 2021-01-17 DIAGNOSIS — R251 Tremor, unspecified: Secondary | ICD-10-CM

## 2021-01-17 DIAGNOSIS — I10 Essential (primary) hypertension: Secondary | ICD-10-CM

## 2021-01-17 NOTE — Discharge Instructions (Addendum)
Continue your current medications the same, including your blood pressure medicine.  If you do need something for cough or congestion, I would only use Robitussin plain or Mucinex plain. Follow-up with your regular doctor as scheduled for 1 February

## 2021-01-17 NOTE — ED Provider Notes (Signed)
[facility]    CSN: [id number] Arrival date & time: 01/17/21  1113      History   Chief Complaint Chief Complaint  Patient presents with   Hypertension    HPI [name] is a 51 y.o. female.    Hypertension  Here with tremor in her hands for 1 week or more.  She has been concerned that that meant her blood pressure was elevated.  She has also had cold symptoms for 2 to 3 weeks.  Mainly she has had mild rhinorrhea and a little cough.  She has taken some Robitussin, uncertain if it is 1 that has decongestant in it.  She has not taken that persistently in the last few days.  Past Medical History:  Diagnosis Date   HTN (hypertension) 04/11/2020    Patient Active Problem List   Diagnosis Date Noted   Suicidal behavior with attempted self-injury ([place]) 04/12/2020   Ingestion of toxic substance 04/11/2020   HTN (hypertension) 04/11/2020   Bipolar affective disorder, manic, severe, with psychotic behavior ([place]) 07/29/2019   Bipolar affective disorder, current episode manic ([place]) 05/18/2018   Bipolar affective disorder, current episode manic with psychotic symptoms ([place]) 05/18/2018   Uterine fibroid 02/06/2017   Bipolar affective disorder, mixed, severe, with psychotic behavior ([place]) 12/28/2016   Bipolar I disorder, current or most recent episode manic, with psychotic features ([place]) 12/28/2016   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 Authorizing Provider  acetaminophen (TYLENOL) 325 MG tablet Take 2 tablets (650 mg total) by mouth every 6 (six) hours as  needed for headache or fever. 04/15/20   Mercy Riding, MD  amLODipine (NORVASC) 10 MG tablet Take 1 tablet (10 mg total) by mouth daily. 06/08/20   [name], NP  aspirin EC 325 MG tablet Take 1 tablet (325 mg total) by mouth daily. Patient not taking: Reported on 01/17/2021 07/03/20   Domenic Moras, PA-C  Blood Pressure Monitor DEVI 1 Bag by Does not apply route 3 (three) times daily. 02/04/20   [name], NP  divalproex (DEPAKOTE) 250 MG DR tablet Take 1 tablet (250 mg total) by mouth every 12 (twelve) hours. Patient not taking: Reported on 01/17/2021 04/15/20   Mercy Riding, MD  ferrous sulfate 325 (65 FE) MG tablet Take 1 tablet (325 mg total) by mouth 2 (two) times daily with a meal. Patient not taking: Reported on 01/17/2021 04/15/20   Mercy Riding, MD  hydrALAZINE (APRESOLINE) 25 MG tablet Take 1 tablet (25 mg total) by mouth every 6 (six) hours as needed (SBP>160 or DBP>110). Patient not taking: Reported on 01/17/2021 06/08/20   [name], NP  loratadine (CLARITIN) 10 MG tablet Take 1 tablet (10 mg total) by mouth daily. Patient not taking: Reported on  01/17/2021 04/15/20   Mercy Riding, MD  losartan-hydrochlorothiazide (HYZAAR) 100-25 MG tablet Take 1 tablet by mouth daily. 06/08/20   [name], NP  polyethylene glycol (MIRALAX / GLYCOLAX) 17 g packet Take 17 g by mouth 2 (two) times daily as needed for mild constipation. Patient not taking: Reported on 01/17/2021 04/15/20   Mercy Riding, MD  senna-docusate (SENOKOT-S) 8.6-50 MG tablet Take 1 tablet by mouth 2 (two) times daily as needed for moderate constipation. Patient not taking: Reported on 01/17/2021 04/15/20   Mercy Riding, MD    Family History Family History  Problem Relation Age of Onset   Breast cancer Mother        Deceased, 50   Hypertension Mother    Arthritis Father    Hypertension Father    Healthy Son    Asthma Daughter    Diabetes type II Daughter     Social History Social History    Tobacco Use   Smoking status: Never   Smokeless tobacco: Never  Vaping Use   Vaping Use: Never used  Substance Use Topics   Alcohol use: No   Drug use: No     Allergies   Latex and Risperdal [risperidone]   Review of Systems Review of Systems   Physical Exam Triage Vital Signs ED Triage Vitals  Enc Vitals Group     BP 01/17/21 1128 123/84     Pulse Rate 01/17/21 1128 (!) 57     Resp 01/17/21 1128 14     Temp 01/17/21 1128 98.1 F (36.7 C)     Temp Source 01/17/21 1128 Oral     SpO2 01/17/21 1128 99 %     Weight --      Height --      Head Circumference --      Peak Flow --      Pain Score 01/17/21 1130 0     Pain Loc --      Pain Edu? --      Excl. in [place]? --    No data found.  Updated Vital Signs BP 123/84 (BP Location: Left Arm)    Pulse (!) 57    Temp 98.1 F (36.7 C) (Oral)    Resp 14    SpO2 99%   Visual Acuity Right Eye Distance:   Left Eye Distance:   Bilateral Distance:    Right Eye Near:   Left Eye Near:    Bilateral Near:     Physical Exam Vitals reviewed.  Constitutional:      General: She is not in acute distress.    Appearance: She is not toxic-appearing.  HENT:     Right Ear: Tympanic membrane and ear canal normal.     Left Ear: Tympanic membrane and ear canal normal.     Nose: Nose normal.     Mouth/Throat:     Mouth: Mucous membranes are moist.     Pharynx: No oropharyngeal exudate or posterior oropharyngeal erythema.  Eyes:     Extraocular Movements: Extraocular movements intact.     Conjunctiva/sclera: Conjunctivae normal.     Pupils: Pupils are equal, round, and reactive to light.  Cardiovascular:     Rate and Rhythm: Normal rate and regular rhythm.     Heart sounds: No murmur heard. Pulmonary:     Effort: Pulmonary effort is normal. No respiratory distress.     Breath sounds: No wheezing, rhonchi or rales.  Chest:     Chest wall: No  tenderness.  Musculoskeletal:     Cervical back: Neck supple.  Lymphadenopathy:      Cervical: No cervical adenopathy.  Skin:    Capillary Refill: Capillary refill takes less than 2 seconds.     Coloration: Skin is not jaundiced or pale.  Neurological:     General: No focal deficit present.     Mental Status: She is alert and oriented to person, place, and time.     Comments: Fine tremor of hands noted.  Psychiatric:        Behavior: Behavior normal.     UC Treatments / Results  Labs (all labs ordered are listed, but only abnormal results are displayed) Labs Reviewed - No data to display  EKG   Radiology No results found.  Procedures Procedures (including critical care time)  Medications Ordered in UC Medications - No data to display  Initial Impression / Assessment and Plan / UC Course  I have reviewed the triage vital signs and the nursing notes.  Pertinent labs & imaging results that were available during my care of the patient were reviewed by me and considered in my medical decision making (see chart for details).     With normal blood pressure and essentially normal heart rate, doubt there is any process such as hyperthyroidism going on.  Discussed that her blood pressure is really good.  She should continue her blood pressure medications and follow-up with her primary doctor for possible lab evaluation.  She states she is already on something for nerves, though she states today she does not feel that nervous Final Clinical Impressions(s) / UC Diagnoses   Final diagnoses:  Tremor  Essential hypertension     Discharge Instructions      Continue your current medications the same, including your blood pressure medicine.  If you do need something for cough or congestion, I would only use Robitussin plain or Mucinex plain. Follow-up with your regular doctor as scheduled for 1 February     ED Prescriptions   None    PDMP not reviewed this encounter.   Barrett Henle, MD 01/17/21 1209

## 2021-01-17 NOTE — ED Triage Notes (Signed)
Pt presents with c/o high BP and runny nose x1 week

## 2021-01-25 ENCOUNTER — Encounter (HOSPITAL_COMMUNITY): Payer: Self-pay | Admitting: Emergency Medicine

## 2021-01-25 ENCOUNTER — Ambulatory Visit (HOSPITAL_COMMUNITY)
Admission: EM | Admit: 2021-01-25 | Discharge: 2021-01-25 | Disposition: A | Discharge status: Home / Self Care | Payer: Medicaid Other

## 2021-01-25 ENCOUNTER — Other Ambulatory Visit: Payer: Self-pay

## 2021-01-25 ENCOUNTER — Ambulatory Visit (INDEPENDENT_AMBULATORY_CARE_PROVIDER_SITE_OTHER): Payer: Medicaid Other

## 2021-01-25 DIAGNOSIS — M25571 Pain in right ankle and joints of right foot: Secondary | ICD-10-CM

## 2021-01-25 NOTE — Discharge Instructions (Signed)
Your x-ray did not show any arthritis or fractures.  I suspect some of your pain is coming from having flat feet.  Try to wear comfortable, supportive shoes.  You can get arch support insoles for your shoes from [employer] or [employer].  You can take ibuprofen 600mg  (3 tablets total) every 6 hrs as needed for your pain.  If you aren't improving over the next 2-4 weeks, please call the sports medicine office to schedule an appointment.

## 2021-01-25 NOTE — ED Triage Notes (Signed)
Patient has right ankle pain.  Reports pain in ankle that started 2 months ago.   Patient has not had this complaint evaluated by anyone before now.  Patient relates the pain to a fall 6 months ago.

## 2021-01-25 NOTE — ED Provider Notes (Signed)
[place]    CSN: [id number] Arrival date & time: 01/25/21  4259      History   Chief Complaint Chief Complaint  Patient presents with   Ankle Pain    HPI [name] is a 51 y.o. female.   Right Ankle Pain Ongoing for 2 months States that she was tired of having the pain and wanted to come in to make sure nothing was wrong States about 6 months ago she had an injury to her ankle in which she rolled it, but this started to feel better after the initial period of pain She states that the pain then started again about 2 months ago She states that the pain is deep inside and she does not find that her foot is very tender States that hurts more to walk Denies any limping Has not tried anything for the pain Denies any swelling Denies any prior ankle pain   Past Medical History:  Diagnosis Date   HTN (hypertension) 04/11/2020    Patient Active Problem List   Diagnosis Date Noted   Suicidal behavior with attempted self-injury ([place]) 04/12/2020   Ingestion of toxic substance 04/11/2020   HTN (hypertension) 04/11/2020   Bipolar affective disorder, manic, severe, with psychotic behavior ([place]) 07/29/2019   Bipolar affective disorder, current episode manic ([place]) 05/18/2018   Bipolar affective disorder, current episode manic with psychotic symptoms ([place]) 05/18/2018   Uterine fibroid 02/06/2017   Bipolar affective disorder, mixed, severe, with psychotic behavior ([place]) 12/28/2016   Bipolar I disorder, current or most recent episode manic, with psychotic features ([place]) 12/28/2016   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 Authorizing Provider  acetaminophen (TYLENOL) 325 MG tablet Take 2 tablets (650 mg total) by mouth every 6 (six) hours as needed for headache or fever. 04/15/20   Mercy Riding, MD  amLODipine (NORVASC) 10 MG tablet Take 1 tablet (10 mg total) by mouth daily. 06/08/20   [name], NP  aspirin EC 325 MG tablet Take 1 tablet (325 mg total) by mouth daily. Patient not taking: Reported on 01/17/2021 07/03/20   Domenic Moras, PA-C  Blood Pressure Monitor DEVI 1 Bag by Does not apply route 3 (three) times daily. 02/04/20   [name], NP  divalproex (DEPAKOTE) 250 MG DR tablet Take 1 tablet (250 mg total) by mouth every 12 (twelve) hours. Patient not taking: Reported on 01/17/2021 04/15/20   Mercy Riding, MD  ferrous sulfate 325 (65 FE) MG tablet Take 1 tablet (325 mg total) by mouth 2 (two) times daily with a meal. Patient not taking: Reported on 01/17/2021 04/15/20   Mercy Riding, MD  hydrALAZINE (APRESOLINE) 25 MG tablet Take 1 tablet (25 mg total) by mouth every 6 (six) hours as needed (SBP>160  or DBP>110). Patient not taking: Reported on 01/17/2021 06/08/20   [name], NP  loratadine (CLARITIN) 10 MG tablet Take 1 tablet (10 mg total) by mouth daily. Patient not taking: Reported on 01/17/2021 04/15/20   Mercy Riding, MD  losartan-hydrochlorothiazide (HYZAAR) 100-25 MG tablet Take 1 tablet by mouth daily. 06/08/20   [name], NP  polyethylene glycol (MIRALAX / GLYCOLAX) 17 g packet Take 17 g by mouth 2 (two) times daily as needed for mild constipation. Patient not taking: Reported on 01/17/2021 04/15/20   Mercy Riding, MD  PROZAC 20 MG capsule Take 20 mg by mouth daily. 08/22/20   [provider]  senna-docusate (SENOKOT-S) 8.6-50 MG tablet Take 1 tablet by mouth 2 (two) times daily as needed for moderate constipation. Patient not taking: Reported on 01/17/2021 04/15/20   Mercy Riding, MD    Family  History Family History  Problem Relation Age of Onset   Breast cancer Mother        Deceased, 59   Hypertension Mother    Arthritis Father    Hypertension Father    Healthy Son    Asthma Daughter    Diabetes type II Daughter     Social History Social History   Tobacco Use   Smoking status: Never   Smokeless tobacco: Never  Vaping Use   Vaping Use: Never used  Substance Use Topics   Alcohol use: No   Drug use: No     Allergies   Latex and Risperdal [risperidone]   Review of Systems Review of Systems  All other systems reviewed and are negative. Per HPI  Physical Exam Triage Vital Signs ED Triage Vitals  Enc Vitals Group     BP      Pulse      Resp      Temp      Temp src      SpO2      Weight      Height      Head Circumference      Peak Flow      Pain Score      Pain Loc      Pain Edu?      Excl. in [place]?    No data found.  Updated Vital Signs BP (!) 141/83 (BP Location: Right Arm)    Pulse (!) 57    Temp 98.3 F (36.8 C) (Oral)    Resp (!) 21    SpO2 99%   Visual Acuity Right Eye Distance:   Left Eye Distance:   Bilateral Distance:    Right Eye Near:   Left Eye Near:    Bilateral Near:     Physical Exam Constitutional:      General: She is not in acute distress.    Appearance: Normal appearance. She is not ill-appearing.  HENT:     Head: Normocephalic and atraumatic.  Eyes:     Conjunctiva/sclera: Conjunctivae normal.  Cardiovascular:     Rate and Rhythm: Normal rate.  Pulmonary:     Effort: Pulmonary effort is normal. No respiratory distress.  Musculoskeletal:     Cervical back: Normal range of motion.     Comments: Right Ankle: - Inspection: No obvious deformity, erythema, swelling, or ecchymosis, ulcers, calluses, blisters b/l - Palpation: No TTP at MT heads, no TTP at base of 5th MT, no TTP over cuboid, no tenderness over navicular prominence, no TTP over lateral or medial malleolus.  No sign of peroneal  tendon subluxation or  TTP. - Strength: Normal strength with dorsiflexion, plantarflexion, inversion, and eversion of foot; flexion and extension of toes b/l - ROM: Full ROM b/l - Neuro/vasc: NV intact distally bilaterally - Special Tests: Negative anterior drawer, normal inversion test.  Negative syndesmotic compression.  Feet: Bilateral pes planus    Skin:    General: Skin is warm and dry.  Neurological:     Mental Status: She is alert and oriented to person, place, and time.  Psychiatric:        Mood and Affect: Mood normal.        Behavior: Behavior normal.     UC Treatments / Results  Labs (all labs ordered are listed, but only abnormal results are displayed) Labs Reviewed - No data to display  EKG   Radiology DG Ankle Complete Right  Result Date: 01/25/2021 CLINICAL DATA:  Right ankle pain 2 months.  Injury 6 months ago. EXAM: RIGHT ANKLE - COMPLETE 3+ VIEW COMPARISON:  None. FINDINGS: There is no evidence of fracture, dislocation, or joint effusion. There is no evidence of arthropathy or other focal bone abnormality. Soft tissues are unremarkable. IMPRESSION: Negative. Electronically Signed   By: Franchot Gallo M.D.   On: 01/25/2021 11:49    Procedures Procedures (including critical care time)  Medications Ordered in UC Medications - No data to display  Initial Impression / Assessment and Plan / UC Course  I have reviewed the triage vital signs and the nursing notes.  Pertinent labs & imaging results that were available during my care of the patient were reviewed by me and considered in my medical decision making (see chart for details).     XR negative for acute process and no significant degenerative changes.  No signs of peroneal tendinitis.  Exam is overall reassuring.  Suspect pes planus could be contributing to extra stress on tibiotalar joint.  Recommend OTC arch support.  Can also take ibuprofen 600mg  q6h prn with food for pain.  F/U with sports medicine in 2 weeks if not  improving.   Mild bradycardia stable from prior visits.  BP slightly elevated, recommend PCP f/u.   Final Clinical Impressions(s) / UC Diagnoses   Final diagnoses:  Acute right ankle pain     Discharge Instructions      Your x-ray did not show any arthritis or fractures.  I suspect some of your pain is coming from having flat feet.  Try to wear comfortable, supportive shoes.  You can get arch support insoles for your shoes from [employer] or [employer].  You can take ibuprofen 600mg  (3 tablets total) every 6 hrs as needed for your pain.  If you aren't improving over the next 2-4 weeks, please call the sports medicine office to schedule an appointment.     ED Prescriptions   None    PDMP not reviewed this encounter.   Cleophas Dunker, DO 01/25/21 1153

## 2021-02-01 ENCOUNTER — Encounter (INDEPENDENT_AMBULATORY_CARE_PROVIDER_SITE_OTHER): Payer: Self-pay | Admitting: Primary Care

## 2021-02-01 ENCOUNTER — Ambulatory Visit (INDEPENDENT_AMBULATORY_CARE_PROVIDER_SITE_OTHER): Payer: Medicaid Other | Admitting: Primary Care

## 2021-02-01 ENCOUNTER — Other Ambulatory Visit: Payer: Self-pay

## 2021-02-01 VITALS — BP 132/72 | HR 58 | Temp 98.1°F | Ht 67.0 in | Wt 260.0 lb

## 2021-02-01 DIAGNOSIS — I10 Essential (primary) hypertension: Secondary | ICD-10-CM | POA: Diagnosis not present

## 2021-02-01 DIAGNOSIS — G250 Essential tremor: Secondary | ICD-10-CM | POA: Diagnosis not present

## 2021-02-01 DIAGNOSIS — Z79899 Other long term (current) drug therapy: Secondary | ICD-10-CM | POA: Diagnosis not present

## 2021-02-01 DIAGNOSIS — F3164 Bipolar disorder, current episode mixed, severe, with psychotic features: Secondary | ICD-10-CM

## 2021-02-01 DIAGNOSIS — Z23 Encounter for immunization: Secondary | ICD-10-CM

## 2021-02-01 NOTE — Patient Instructions (Signed)
Influenza, Adult °Influenza is also called "the flu." It is an infection in the lungs, nose, and throat (respiratory tract). It spreads easily from person to person (is contagious). The flu causes symptoms that are like a cold, along with high fever and body aches. °What are the causes? °This condition is caused by the influenza virus. You can get the virus by: °Breathing in droplets that are in the air after a person infected with the flu coughed or sneezed. °Touching something that has the virus on it and then touching your mouth, nose, or eyes. °What increases the risk? °Certain things may make you more likely to get the flu. These include: °Not washing your hands often. °Having close contact with many people during cold and flu season. °Touching your mouth, eyes, or nose without first washing your hands. °Not getting a flu shot every year. °You may have a higher risk for the flu, and serious problems, such as a lung infection (pneumonia), if you: °Are older than 65. °Are pregnant. °Have a weakened disease-fighting system (immune system) because of a disease or because you are taking certain medicines. °Have a long-term (chronic) condition, such as: °Heart, kidney, or lung disease. °Diabetes. °Asthma. °Have a liver disorder. °Are very overweight (morbidly obese). °Have anemia. °What are the signs or symptoms? °Symptoms usually begin suddenly and last 4-14 days. They may include: °Fever and chills. °Headaches, body aches, or muscle aches. °Sore throat. °Cough. °Runny or stuffy (congested) nose. °Feeling discomfort in your chest. °Not wanting to eat as much as normal. °Feeling weak or tired. °Feeling dizzy. °Feeling sick to your stomach or throwing up. °How is this treated? °If the flu is found early, you can be treated with antiviral medicine. This can help to reduce how bad the illness is and how long it lasts. This may be given by mouth or through an IV tube. °Taking care of yourself at home can help your  symptoms get better. Your doctor may want you to: °Take over-the-counter medicines. °Drink plenty of fluids. °The flu often goes away on its own. If you have very bad symptoms or other problems, you may be treated in a hospital. °Follow these instructions at home: °  °Activity °Rest as needed. Get plenty of sleep. °Stay home from work or school as told by your doctor. °Do not leave home until you do not have a fever for 24 hours without taking medicine. °Leave home only to go to your doctor. °Eating and drinking °Take an ORS (oral rehydration solution). This is a drink that is sold at pharmacies and stores. °Drink enough fluid to keep your pee pale yellow. °Drink clear fluids in small amounts as you are able. Clear fluids include: °Water. °Ice chips. °Fruit juice mixed with water. °Low-calorie sports drinks. °Eat bland foods that are easy to digest. Eat small amounts as you are able. These foods include: °Bananas. °Applesauce. °Rice. °Lean meats. °Toast. °Crackers. °Do not eat or drink: °Fluids that have a lot of sugar or caffeine. °Alcohol. °Spicy or fatty foods. °General instructions °Take over-the-counter and prescription medicines only as told by your doctor. °Use a cool mist humidifier to add moisture to the air in your home. This can make it easier for you to breathe. °When using a cool mist humidifier, clean it daily. Empty water and replace with clean water. °Cover your mouth and nose when you cough or sneeze. °Wash your hands with soap and water often and for at least 20 seconds. This is also important after   you cough or sneeze. If you cannot use soap and water, use alcohol-based hand sanitizer. °Keep all follow-up visits. °How is this prevented? ° °Get a flu shot every year. You may get the flu shot in late summer, fall, or winter. Ask your doctor when you should get your flu shot. °Avoid contact with people who are sick during fall and winter. This is cold and flu season. °Contact a doctor if: °You get  new symptoms. °You have: °Chest pain. °Watery poop (diarrhea). °A fever. °Your cough gets worse. °You start to have more mucus. °You feel sick to your stomach. °You throw up. °Get help right away if you: °Have shortness of breath. °Have trouble breathing. °Have skin or nails that turn a bluish color. °Have very bad pain or stiffness in your neck. °Get a sudden headache. °Get sudden pain in your face or ear. °Cannot eat or drink without throwing up. °These symptoms may represent a serious problem that is an emergency. Get medical help right away. Call your local emergency services (911 in the [place] °Do not wait to see if the symptoms will go away. °Do not drive yourself to the hospital. °Summary °Influenza is also called "the flu." It is an infection in the lungs, nose, and throat. It spreads easily from person to person. °Take over-the-counter and prescription medicines only as told by your doctor. °Getting a flu shot every year is the best way to not get the flu. °This information is not intended to replace advice given to you by your health care provider. Make sure you discuss any questions you have with your health care provider. °Document Revised: 08/07/2019 Document Reviewed: 08/07/2019 °Elsevier Patient Education © 2022 [employer] Inc. ° °

## 2021-02-01 NOTE — Progress Notes (Signed)
[place], is a 51 y.o. female  OTL:[id number]  HRC:[id number]  DOB - [birth year]  Chief Complaint  Patient presents with   Hypertension   Shaking    hands       Subjective:   [name] is a 51 y.o. morbid obese female here today for a follow up visit after being seen at the emergency room for tremors and blood pressure concerns.  Patient is also having problems with her eyes has a follow-up with a ophthalmologist.  ED recommended PCP and labs.  Discussed with patient her psych medications cannot be stopped abruptly this is when she informed PCP that a team came out to her house prescribing medication and following her.  Unclear of what medications she is on to evaluate if hand tremors are a side effect.  Patient has No headache, No chest pain, No abdominal pain - No Nausea, No new weakness tingling or numbness, No Cough - shortness of breath.   No problems updated.  ALLERGIES: Allergies  Allergen Reactions   Latex    Risperdal [Risperidone] Other (See Comments)    Caused eyelids to relax requiring treatment    PAST MEDICAL HISTORY: Past Medical History:  Diagnosis Date   HTN (hypertension) 04/11/2020    MEDICATIONS AT HOME: Prior to Admission medications   Medication Sig Start Date End Date Taking? Authorizing Provider  amLODipine (NORVASC) 10 MG tablet Take 1 tablet (10 mg total) by mouth daily. 06/08/20  Yes [name], NP  losartan-hydrochlorothiazide (HYZAAR) 100-25 MG tablet Take 1 tablet by mouth daily. 06/08/20  Yes [name], NP  Blood Pressure Monitor DEVI 1 Bag by Does not apply route 3 (three) times daily. Patient not taking: Reported on 02/01/2021 02/04/20   [name], NP  divalproex (DEPAKOTE) 250 MG DR tablet Take 1 tablet (250 mg total) by mouth every 12 (twelve) hours. Patient not taking: Reported on 01/17/2021 04/15/20   Mercy Riding, MD  PROZAC 20 MG capsule Take 20 mg by mouth  daily. Patient not taking: Reported on 02/01/2021 08/22/20   [provider]    Objective:   Vitals:   02/01/21 1000  BP: 132/72  Pulse: (!) 58  Temp: 98.1 F (36.7 C)  TempSrc: Oral  SpO2: 96%  Weight: 260 lb (117.9 kg)  Height: $Remove'5\' 7"'PykiYTp$  (1.702 m)   Exam General appearance : Awake, alert, not in any distress. Speech Clear. Not toxic looking HEENT: Atraumatic and Normocephalic, pupils equally reactive to light and accomodation Neck: Supple, no JVD. No cervical lymphadenopathy.  Chest: Good air entry bilaterally, no added sounds  CVS: S1 S2 regular, no murmurs.  Abdomen: Bowel sounds present, Non tender and not distended with no gaurding, rigidity or rebound. Extremities: B/L Lower Ext shows no edema, both legs are warm to touch Neurology: Awake alert, and oriented X 3, CN II-XII intact, Non focal Skin: No Rash  Data Review Lab Results  Component Value Date   HGBA1C 5.2 02/04/2020   HGBA1C 5.0 07/30/2019   HGBA1C 5.1 12/29/2016    Assessment & Plan  [name] was seen today for hypertension and shaking.  Diagnoses and all orders for this visit:  Need for immunization against influenza -     Flu Vaccine QUAD 57mo+IM (Fluarix, Fluzone & Alfiuria Quad PF)  Benign essential tremor Unclear of medications she is followed by psyche per patient that does her visit at home.  -     Vitamin B12 -  Magnesium, RBC  Bipolar affective disorder, mixed, severe, with psychotic behavior ([place])  psyche per patient that does her visit at home.  -      Essential hypertension Counseled on blood pressure goal of less than 130/80, low-sodium, DASH diet, medication compliance, 150 minutes of moderate intensity exercise per week. Discussed medication compliance, adverse effects.  -     CMP14+EGFR  High risk medication use -     Vitamin B12 -     CBC with Differential -     CMP14+EGFR -     Magnesium, RBC    Patient have been counseled extensively about nutrition and exercise.  Other issues discussed during this visit include: low cholesterol diet, weight control and daily exercise, foot care, annual eye examinations at Ophthalmology, importance of adherence with medications and regular follow-up. We also discussed long term complications of uncontrolled diabetes and hypertension.   Return in about 6 months (around 08/01/2021) for BP.  The patient was given clear instructions to go to ER or return to medical center if symptoms don't improve, worsen or new problems develop. The patient verbalized understanding. The patient was told to call to get lab results if they haven't heard anything in the next week.   This note has been created with [employer], quantity. Any transcriptional errors are unintentional.   [name], NP 02/02/2021, 2:58 PM

## 2021-02-01 NOTE — Progress Notes (Signed)
Pt is fasting  She complains of vision issues but is scheduled to see a specialist through [place] of hands shaking  Denies pain

## 2021-02-04 LAB — CBC WITH DIFFERENTIAL/PLATELET
Basophils Absolute: 0.1 10*3/uL (ref 0.0–0.2)
Basos: 1 %
EOS (ABSOLUTE): 0.1 10*3/uL (ref 0.0–0.4)
Eos: 1 %
Hematocrit: 33 % — ABNORMAL LOW (ref 34.0–46.6)
Hemoglobin: 10.7 g/dL — ABNORMAL LOW (ref 11.1–15.9)
Immature Grans (Abs): 0 10*3/uL (ref 0.0–0.1)
Immature Granulocytes: 0 %
Lymphocytes Absolute: 1.6 10*3/uL (ref 0.7–3.1)
Lymphs: 25 %
MCH: 28.6 pg (ref 26.6–33.0)
MCHC: 32.4 g/dL (ref 31.5–35.7)
MCV: 88 fL (ref 79–97)
Monocytes Absolute: 0.7 10*3/uL (ref 0.1–0.9)
Monocytes: 11 %
Neutrophils Absolute: 3.8 10*3/uL (ref 1.4–7.0)
Neutrophils: 62 %
Platelets: 293 10*3/uL (ref 150–450)
RBC: 3.74 x10E6/uL — ABNORMAL LOW (ref 3.77–5.28)
RDW: 13.2 % (ref 11.7–15.4)
WBC: 6.2 10*3/uL (ref 3.4–10.8)

## 2021-02-04 LAB — CMP14+EGFR
ALT: 6 IU/L (ref 0–32)
AST: 16 IU/L (ref 0–40)
Albumin/Globulin Ratio: 1.5 (ref 1.2–2.2)
Albumin: 4.1 g/dL (ref 3.8–4.8)
Alkaline Phosphatase: 49 IU/L (ref 44–121)
BUN/Creatinine Ratio: 14 (ref 9–23)
BUN: 11 mg/dL (ref 6–24)
Bilirubin Total: 0.2 mg/dL (ref 0.0–1.2)
CO2: 27 mmol/L (ref 20–29)
Calcium: 9.2 mg/dL (ref 8.7–10.2)
Chloride: 103 mmol/L (ref 96–106)
Creatinine, Ser: 0.76 mg/dL (ref 0.57–1.00)
Globulin, Total: 2.7 g/dL (ref 1.5–4.5)
Glucose: 85 mg/dL (ref 70–99)
Potassium: 3.6 mmol/L (ref 3.5–5.2)
Sodium: 141 mmol/L (ref 134–144)
Total Protein: 6.8 g/dL (ref 6.0–8.5)
eGFR: 97 mL/min/{1.73_m2} (ref >59)

## 2021-02-04 LAB — VITAMIN B12: Vitamin B-12: 822 pg/mL (ref 232–1245)

## 2021-02-04 LAB — MAGNESIUM, RBC: Magnesium RBC: 5.2 mg/dL (ref 4.2–6.8)

## 2021-03-23 ENCOUNTER — Telehealth (INDEPENDENT_AMBULATORY_CARE_PROVIDER_SITE_OTHER): Payer: Self-pay

## 2021-03-23 NOTE — Telephone Encounter (Signed)
Copied from [facility] [id number]. Topic: General - Inquiry ?>> Mar 23, 2021 10:27 AM [name] D wrote: ?Reason for CRM: Pt called saying her neurologist at [facility] told her to call and have [name] to call in propranolol for her.  This is per patient ? ?CB# [phone] ? ? ?Here is the specific plan that neurologist advised of. Please send to pharmacy if appropriate. Nat Christen, CMA  ? ?Plan: ?Below plan was discussed with the patient who expressed agreement ? Testing as noted below ? I would have started her on propanolol myself but given her comorbidities of hypertension I would defer it to PCP so that dose can be optimized keeping in view blood pressure and heart rate. My recommendation would be propanolol 20 mg twice daily as a starting dose ?

## 2021-03-24 MED ORDER — PROPRANOLOL HCL 20 MG PO TABS: 20.0000 mg | ORAL_TABLET | Freq: Two times a day (BID) | ORAL | 1 refills | Status: DC

## 2021-03-24 NOTE — Addendum Note (Signed)
Addended by: Daisy Blossom, Annie Main L on: 03/24/2021 10:18 AM ? ? Modules accepted: Orders ? ?

## 2021-03-24 NOTE — Telephone Encounter (Signed)
Per Neurologist, recommendation is to start propanolol 20 mg twice daily as a starting dose. I recommend to see her in 2-3 weeks for BP check. We can increase this dose as BP and HR allow at follow-up. Rx for propranolol 20 mg Bid sent to pharmacy.  ?

## 2021-03-28 ENCOUNTER — Other Ambulatory Visit (INDEPENDENT_AMBULATORY_CARE_PROVIDER_SITE_OTHER): Payer: Self-pay

## 2021-03-28 ENCOUNTER — Encounter (INDEPENDENT_AMBULATORY_CARE_PROVIDER_SITE_OTHER): Payer: Self-pay | Admitting: Primary Care

## 2021-03-28 ENCOUNTER — Other Ambulatory Visit: Payer: Self-pay

## 2021-03-28 ENCOUNTER — Ambulatory Visit (INDEPENDENT_AMBULATORY_CARE_PROVIDER_SITE_OTHER): Payer: Medicaid Other | Admitting: Primary Care

## 2021-03-28 ENCOUNTER — Telehealth (INDEPENDENT_AMBULATORY_CARE_PROVIDER_SITE_OTHER): Payer: Self-pay | Admitting: Primary Care

## 2021-03-28 VITALS — BP 134/93 | HR 48 | Temp 97.7°F | Ht 67.0 in | Wt 263.4 lb

## 2021-03-28 DIAGNOSIS — Z760 Encounter for issue of repeat prescription: Secondary | ICD-10-CM

## 2021-03-28 DIAGNOSIS — I10 Essential (primary) hypertension: Secondary | ICD-10-CM | POA: Diagnosis not present

## 2021-03-28 DIAGNOSIS — M25511 Pain in right shoulder: Secondary | ICD-10-CM

## 2021-03-28 DIAGNOSIS — G250 Essential tremor: Secondary | ICD-10-CM

## 2021-03-28 DIAGNOSIS — Z1211 Encounter for screening for malignant neoplasm of colon: Secondary | ICD-10-CM

## 2021-03-28 DIAGNOSIS — M25512 Pain in left shoulder: Secondary | ICD-10-CM

## 2021-03-28 MED ORDER — PROPRANOLOL HCL 20 MG PO TABS: 20.0000 mg | ORAL_TABLET | Freq: Two times a day (BID) | ORAL | 1 refills | Status: DC

## 2021-03-28 MED ORDER — AMLODIPINE BESYLATE 10 MG PO TABS: 10.0000 mg | ORAL_TABLET | Freq: Every day | ORAL | 3 refills | Status: DC

## 2021-03-28 MED ORDER — LOSARTAN POTASSIUM-HCTZ 100-25 MG PO TABS: 1.0000 | ORAL_TABLET | Freq: Every day | ORAL | 3 refills | Status: DC
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
## 2021-03-28 NOTE — Telephone Encounter (Signed)
Patient needed propranolol sent to [facility] on [address]. CMA sent to patient pharmacy while on call.  ?

## 2021-03-28 NOTE — Telephone Encounter (Signed)
Copied from [place] [phone]. Topic: General - Other ?>> Mar 28, 2021 12:28 PM Yvette Rack wrote: ?Reason for CRM: Pt requests return call from [name]. Cb# [phone] ?

## 2021-04-04 ENCOUNTER — Other Ambulatory Visit (INDEPENDENT_AMBULATORY_CARE_PROVIDER_SITE_OTHER): Payer: Self-pay | Admitting: Primary Care

## 2021-04-04 ENCOUNTER — Ambulatory Visit (INDEPENDENT_AMBULATORY_CARE_PROVIDER_SITE_OTHER): Payer: Medicaid Other | Admitting: Orthopaedic Surgery

## 2021-04-04 ENCOUNTER — Encounter: Payer: Self-pay | Admitting: Orthopaedic Surgery

## 2021-04-04 ENCOUNTER — Ambulatory Visit (INDEPENDENT_AMBULATORY_CARE_PROVIDER_SITE_OTHER): Payer: Medicaid Other

## 2021-04-04 ENCOUNTER — Other Ambulatory Visit (HOSPITAL_COMMUNITY): Payer: Self-pay

## 2021-04-04 ENCOUNTER — Ambulatory Visit: Payer: Self-pay

## 2021-04-04 VITALS — Ht 67.0 in | Wt 260.0 lb

## 2021-04-04 DIAGNOSIS — M25512 Pain in left shoulder: Secondary | ICD-10-CM

## 2021-04-04 DIAGNOSIS — M25511 Pain in right shoulder: Secondary | ICD-10-CM | POA: Diagnosis not present

## 2021-04-04 DIAGNOSIS — G8929 Other chronic pain: Secondary | ICD-10-CM

## 2021-04-04 MED ORDER — TRAMADOL HCL 50 MG PO TABS: 50.0000 mg | ORAL_TABLET | Freq: Two times a day (BID) | ORAL | 2 refills | Status: AC | PRN

## 2021-04-04 MED ORDER — BLOOD PRESSURE MONITOR MISC
1.0000 | Freq: Three times a day (TID) | 0 refills | Status: DC
  Filled 2021-04-04: qty 1, 1d supply, fill #0

## 2021-04-04 NOTE — Progress Notes (Signed)
? ?Office Visit Note ?  ?Patient: [name]           ?Date of Birth: Jan 20, 1970           ?MRN: [id number] ?Visit Date: 04/04/2021 ?             ?Requested by: [name], NP ?[id number] ?[place],  [place] [address] ?PCP: System, Provider Not In ? ? ?Assessment & Plan: ?Visit Diagnoses:  ?1. Chronic pain of both shoulders   ? ? ?Plan: Impression is bilateral shoulder subacromial bursitis left greater than right.  Today, we proceeded with bilateral subacromial cortisone injections.  I also provided her with a gym exercise program.  If her symptoms have not significantly improved over the next 2 to 3 weeks she will call us and let us know. ? ?Follow-Up Instructions: Return if symptoms worsen or fail to improve.  ? ?Orders:  ?Orders Placed This Encounter  ?Procedures  ? Large Joint Inj: R subacromial bursa  ? Large Joint Inj: L subacromial bursa  ? XR Shoulder Right  ? XR Shoulder Left  ? ?Meds ordered this encounter  ?Medications  ? traMADol (ULTRAM) 50 MG tablet  ?  Sig: Take 1 tablet (50 mg total) by mouth every 12 (twelve) hours as needed.  ?  Dispense:  30 tablet  ?  Refill:  2  ? ? ? ? Procedures: ?Large Joint Inj: bilateral subacromial bursa on 04/04/2021 9:02 AM ?Indications: pain ?Details: 22 G needle ?Outcome: tolerated well, no immediate complications ?Patient was prepped and draped in the usual sterile fashion.  ? ? ? ? ?Clinical Data: ?No additional findings. ? ? ?Subjective: ?Chief Complaint  ?Patient presents with  ? Right Shoulder - Pain  ? Left Shoulder - Pain  ? ? ?HPI patient is a pleasant 51 year old female who comes in today with bilateral shoulder pain left greater than right.  This is been ongoing for the past 6 to 8 months and has progressively worsened.  She denies any injury or change in activity.  The majority of her pain is the top of her shoulder and radiates into the deltoid.  Pain is worse with abduction and sleeping on the left or right side.  She has been taking Tylenol  without relief.  She denies any paresthesias. ? ?Review of Systems as detailed in HPI.  All others reviewed and are negative. ? ? ?Objective: ?Vital Signs: Ht '5\' 7"'$  (1.702 m)   Wt 260 lb (117.9 kg)   BMI 40.72 kg/m?  ? ?Physical Exam well-developed well-nourished female no acute distress.  Alert and oriented x3. ? ?Ortho Exam left shoulder exam reveals forward flexion to approximately 80 degrees.  Internal rotation to her back pocket.  Positive empty can.  No focal weakness.  Forward flexion on the right to 100 degrees and internal rotation to L5.  Positive empty can.  No focal weakness.  She is neurovascular intact distally. ? ?Specialty Comments:  ?No specialty comments available. ? ?Imaging: ?XR Shoulder Left ? ?Result Date: 04/04/2021 ?No acute or structural abnormalities ? ?XR Shoulder Right ? ?Result Date: 04/04/2021 ?No acute or structural abnormalities  ? ? ?PMFS History: ?Patient Active Problem List  ? Diagnosis Date Noted  ? Suicidal behavior with attempted self-injury ([place]) 04/12/2020  ? Ingestion of toxic substance 04/11/2020  ? HTN (hypertension) 04/11/2020  ? Bipolar affective disorder, manic, severe, with psychotic behavior ([place]) 07/29/2019  ? Bipolar affective disorder, current episode manic ([place]) 05/18/2018  ? Bipolar affective disorder, current  episode manic with psychotic symptoms ([place]) 05/18/2018  ? Uterine fibroid 02/06/2017  ? Bipolar affective disorder, mixed, severe, with psychotic behavior ([place]) 12/28/2016  ? Bipolar I disorder, current or most recent episode manic, with psychotic features ([place]) 12/28/2016  ? 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 ?Past Medical History:  ?Diagnosis Date  ? HTN (hypertension) 04/11/2020  ?  ?Family History  ?Problem Relation Age of Onset  ? Breast cancer Mother   ?     Deceased, 20  ? Hypertension Mother   ? Arthritis Father   ? Hypertension Father   ? Healthy Son   ? Asthma Daughter   ? Diabetes type II Daughter   ?   ?Past Surgical History:  ?Procedure Laterality Date  ? CESAREAN SECTION  2002, 2009  ? ?Social History  ? ?Occupational History  ? Not on file  ?Tobacco Use  ? Smoking status: Never  ? Smokeless tobacco: Never  ?Vaping Use  ? Vaping Use: Never used  ?Substance and Sexual Activity  ? Alcohol use: No  ? Drug use: No  ? Sexual activity: Not on file  ? ? ? ? ? ? ?

## 2021-04-05 ENCOUNTER — Other Ambulatory Visit (HOSPITAL_COMMUNITY): Payer: Self-pay

## 2021-04-05 ENCOUNTER — Encounter: Payer: Self-pay | Admitting: Internal Medicine

## 2021-04-07 ENCOUNTER — Other Ambulatory Visit (HOSPITAL_COMMUNITY): Payer: Self-pay

## 2021-04-12 ENCOUNTER — Ambulatory Visit (INDEPENDENT_AMBULATORY_CARE_PROVIDER_SITE_OTHER): Payer: Medicaid Other

## 2021-04-12 ENCOUNTER — Ambulatory Visit (INDEPENDENT_AMBULATORY_CARE_PROVIDER_SITE_OTHER): Payer: Medicaid Other | Admitting: Physician Assistant

## 2021-04-12 ENCOUNTER — Encounter: Payer: Self-pay | Admitting: Physician Assistant

## 2021-04-12 DIAGNOSIS — M79604 Pain in right leg: Secondary | ICD-10-CM

## 2021-04-12 MED ORDER — PREDNISONE 10 MG (21) PO TBPK: ORAL_TABLET | ORAL | 0 refills | Status: DC

## 2021-04-12 MED ORDER — METHOCARBAMOL 500 MG PO TABS: 500.0000 mg | ORAL_TABLET | Freq: Two times a day (BID) | ORAL | 0 refills | Status: DC | PRN

## 2021-04-12 NOTE — Progress Notes (Signed)
? ?Office Visit Note ?  ?Patient: [name]           ?Date of Birth: 05/27/1970           ?MRN: [id number] ?Visit Date: 04/12/2021 ?             ?Requested by: No referring provider defined for this encounter. ?PCP: System, Provider Not In ? ? ?Assessment & Plan: ?Visit Diagnoses:  ?1. Pain in right leg   ? ? ?Plan: Impression is chronic right lower back pain with right lower extremity radiculopathy.  At this point, would like to start the patient on a steroid taper muscle relaxer in addition to outpatient physical therapy.  Referral was made.  She will let us know over the next 6 to 8 weeks if her symptoms have not improved. ? ?Follow-Up Instructions: Return if symptoms worsen or fail to improve.  ? ?Orders:  ?Orders Placed This Encounter  ?Procedures  ? XR Lumbar Spine 2-3 Views  ? XR Pelvis 1-2 Views  ? Ambulatory referral to Physical Therapy  ? ?Meds ordered this encounter  ?Medications  ? predniSONE (STERAPRED UNI-PAK 21 TAB) 10 MG (21) TBPK tablet  ?  Sig: Take as directed  ?  Dispense:  21 tablet  ?  Refill:  0  ? methocarbamol (ROBAXIN) 500 MG tablet  ?  Sig: Take 1 tablet (500 mg total) by mouth 2 (two) times daily as needed.  ?  Dispense:  20 tablet  ?  Refill:  0  ? ? ? ? Procedures: ?No procedures performed ? ? ?Clinical Data: ?No additional findings. ? ? ?Subjective: ?No chief complaint on file. ? ? ?HPI patient is a pleasant 51 year old female who comes in today with right lower back pain and right lower extremity radiculopathy.  She notes the pain starts in her right lower back and radiates into the groin, anterior thigh down the leg into the foot.  Has been ongoing for the past year.  Pain is described as a constant ache to the lower back with numbness to the right leg.  Symptoms appear to be worse when she is sleeping.  She has been taking tramadol without relief.  She does note that she has fallen multiple times with the last fall being last year due to loss of balance.  No previous neck or  back history. ? ?Review of Systems as detailed in HPI.  All others reviewed are negative. ? ? ?Objective: ?Vital Signs: There were no vitals taken for this visit. ? ?Physical Exam well-developed well-nourished female no acute distress.  Alert and oriented x3. ? ?Ortho Exam lumbar spine exam shows no spinous tenderness.  She does have tenderness to right-sided paraspinous musculature.  Increased pain with lumbar flexion.  No pain with extension.  Positive straight leg raise on the right.  Negative logroll negative FADIR.  She does not have any focal weakness.  She is neurovascularly intact distally. ? ?Specialty Comments:  ?No specialty comments available. ? ?Imaging: ?XR Lumbar Spine 2-3 Views ? ?Result Date: 04/12/2021 ?No acute or structural abnormalities ? ?XR Pelvis 1-2 Views ? ?Result Date: 04/12/2021 ?X-rays demonstrate mild degenerative changes right hip joint  ? ? ?PMFS History: ?Patient Active Problem List  ? Diagnosis Date Noted  ? Suicidal behavior with attempted self-injury ([place]) 04/12/2020  ? Ingestion of toxic substance 04/11/2020  ? HTN (hypertension) 04/11/2020  ? Bipolar affective disorder, manic, severe, with psychotic behavior ([place]) 07/29/2019  ? Bipolar affective disorder, current episode manic ([place]) 05/18/2018  ?  Bipolar affective disorder, current episode manic with psychotic symptoms ([place]) 05/18/2018  ? Uterine fibroid 02/06/2017  ? Bipolar affective disorder, mixed, severe, with psychotic behavior ([place]) 12/28/2016  ? Bipolar I disorder, current or most recent episode manic, with psychotic features ([place]) 12/28/2016  ? 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 ?Past Medical History:  ?Diagnosis Date  ? HTN (hypertension) 04/11/2020  ?  ?Family History  ?Problem Relation Age of Onset  ? Breast cancer Mother   ?     Deceased, 33  ? Hypertension Mother   ? Arthritis Father   ? Hypertension Father   ? Healthy Son   ? Asthma Daughter   ? Diabetes type II Daughter    ?  ?Past Surgical History:  ?Procedure Laterality Date  ? CESAREAN SECTION  2002, 2009  ? ?Social History  ? ?Occupational History  ? Not on file  ?Tobacco Use  ? Smoking status: Never  ? Smokeless tobacco: Never  ?Vaping Use  ? Vaping Use: Never used  ?Substance and Sexual Activity  ? Alcohol use: No  ? Drug use: No  ? Sexual activity: Not on file  ? ? ? ? ? ? ?

## 2021-04-13 ENCOUNTER — Ambulatory Visit (AMBULATORY_SURGERY_CENTER): Payer: Medicaid Other

## 2021-04-13 ENCOUNTER — Telehealth: Payer: Self-pay

## 2021-04-13 VITALS — Ht 67.0 in | Wt 260.0 lb

## 2021-04-13 DIAGNOSIS — Z1211 Encounter for screening for malignant neoplasm of colon: Secondary | ICD-10-CM

## 2021-04-13 MED ORDER — NA SULFATE-K SULFATE-MG SULF 17.5-3.13-1.6 GM/177ML PO SOLN: 1.0000 | Freq: Once | ORAL | 0 refills | Status: AC

## 2021-04-13 NOTE — Telephone Encounter (Signed)
Multiple attempts made to reach patient; PV RN unable to reach patient; patient advised to call back to the office prior to end of business day at 5:00 pm; ?If patient fails to call back to the office prior to EOB, PV and procedure appts will be cancelled and a no show letter will be sent to patient via MyChart; ?

## 2021-04-13 NOTE — Progress Notes (Signed)
No egg or soy allergy known to patient  ?No issues known to pt with past sedation with any surgeries or procedures ?Patient denies ever being told they had issues or difficulty with intubation  ?No FH of Malignant Hyperthermia ?Pt is not on diet pills ?Pt is not on home 02  ?Pt is not on blood thinners  ?Pt denies issues with constipation;  ?No A fib or A flutter ?NO PA's for preps discussed with pt in PV today  ?Discussed with pt there will be an out-of-pocket cost for prep and that varies from $0 to 70 + dollars - pt verbalized understanding  ?Pt instructed to use Singlecare.com or GoodRx for a price reduction on prep  ?PV completed over the phone. Pt verified name, DOB, address and insurance during PV today.  ?Pt mailed instruction packet with copy of consent form to read and not return, and instructions.  ?Pt encouraged to call with questions or issues.  ?If pt has My chart, procedure instructions sent via My Chart  ? ?

## 2021-04-24 ENCOUNTER — Other Ambulatory Visit: Payer: Self-pay | Admitting: Physician Assistant

## 2021-04-26 ENCOUNTER — Encounter: Payer: Self-pay | Admitting: Certified Registered Nurse Anesthetist

## 2021-05-03 ENCOUNTER — Other Ambulatory Visit (HOSPITAL_COMMUNITY)
Admission: RE | Admit: 2021-05-03 | Discharge: 2021-05-03 | Disposition: A | Discharge status: Home / Self Care | Payer: Medicaid Other | Source: Ambulatory Visit | Attending: Primary Care | Admitting: Primary Care

## 2021-05-03 ENCOUNTER — Encounter (INDEPENDENT_AMBULATORY_CARE_PROVIDER_SITE_OTHER): Payer: Self-pay | Admitting: Primary Care

## 2021-05-03 ENCOUNTER — Ambulatory Visit (INDEPENDENT_AMBULATORY_CARE_PROVIDER_SITE_OTHER): Payer: Medicaid Other | Admitting: Primary Care

## 2021-05-03 VITALS — BP 142/88 | HR 50 | Temp 98.0°F | Ht 67.0 in | Wt 264.2 lb

## 2021-05-03 DIAGNOSIS — R300 Dysuria: Secondary | ICD-10-CM

## 2021-05-03 DIAGNOSIS — I10 Essential (primary) hypertension: Secondary | ICD-10-CM

## 2021-05-03 DIAGNOSIS — Z124 Encounter for screening for malignant neoplasm of cervix: Secondary | ICD-10-CM

## 2021-05-03 DIAGNOSIS — Z1211 Encounter for screening for malignant neoplasm of colon: Secondary | ICD-10-CM

## 2021-05-03 DIAGNOSIS — Z3009 Encounter for other general counseling and advice on contraception: Secondary | ICD-10-CM

## 2021-05-03 DIAGNOSIS — Z1231 Encounter for screening mammogram for malignant neoplasm of breast: Secondary | ICD-10-CM

## 2021-05-03 DIAGNOSIS — Z113 Encounter for screening for infections with a predominantly sexual mode of transmission: Secondary | ICD-10-CM | POA: Insufficient documentation

## 2021-05-03 LAB — POCT URINALYSIS DIP (CLINITEK)
Bilirubin, UA: NEGATIVE
Glucose, UA: NEGATIVE mg/dL
Ketones, POC UA: NEGATIVE mg/dL
Leukocytes, UA: NEGATIVE
Nitrite, UA: NEGATIVE
POC PROTEIN,UA: NEGATIVE
Spec Grav, UA: 1.015 (ref 1.010–1.025)
Urobilinogen, UA: 0.2 E.U./dL
pH, UA: 7 (ref 5.0–8.0)

## 2021-05-03 NOTE — Patient Instructions (Signed)
Reviewed forms of birth control options available including abstinence; over the counter/barrier methods; hormonal contraceptive medication including pill, patch, ring, injection,contraceptive implant; hormonal and nonhormonal IUDs; permanent sterilization options including vasectomy and the various tubal sterilization modalities.  ? ?

## 2021-05-03 NOTE — Progress Notes (Signed)
?[facility] ? ?WELL-WOMAN PHYSICAL & PAP ?Patient name: [name] MRN [id number]  Date of birth: 07-29-1970 ?Chief Complaint:   ?Gynecologic Exam ? ?History of Present Illness:   ?[name] is a 51 y.o. G60P3000 female being seen today for a routine well-woman exam.  ? ?CC:gyn  ? ? ?The current method of family planning is none.  ?No LMP recorded. Patient is premenopausal. ?Last pap 2019. Results were:  unknown  ?Last mammogram: refused . No Family h/o breast cancer: No ?Last colonoscopy: 04/13/21 evaluation. Family h/o colorectal cancer: No ? ?Review of Systems:   ? ?Denies any headaches, blurred vision, fatigue, shortness of breath, chest pain, abdominal pain, abnormal vaginal discharge/itching/odor/irritation, problems with periods, bowel movements, urination, or intercourse unless otherwise stated above. ? ?Pertinent History Reviewed:  ? ?Reviewed past medical,surgical, social and family history.  ?Reviewed problem list, medications and allergies. ? ?Physical Assessment:  ? ?Vitals:  ? 05/03/21 1115  ?BP: (!) 142/88  ?Pulse: (!) 50  ?Temp: 98 ?F (36.7 ?C)  ?TempSrc: Oral  ?SpO2: 95%  ?Weight: 264 lb 3.2 oz (119.8 kg)  ?Height: '5\' 7"'$  (1.702 m)  ?Body mass index is 41.38 kg/m?. ?  ?     Physical Examination:  ?General appearance - well appearing, and in no distress ?Mental status - alert, oriented to person, place, and time ?Psych:  She has a normal mood and affect ?Skin - warm and dry, normal color, no suspicious lesions noted ?Chest - effort normal, all lung fields clear to auscultation bilaterally ?Heart - normal rate and regular rhythm ?Neck:  midline trachea, no thyromegaly or nodules ?Breasts - breasts appear normal, fibrocystic breast bilateral, no skin or nipple changes or axillary nodes ?Educated patient on proper self breast examination and had patient to demonstrate SBE. ?Abdomen - soft, nontender, nondistended, no masses or organomegaly ?Pelvic-VULVA: normal appearing vulva  with no masses, tenderness or lesions   ?VAGINA: normal appearing vagina with normal color and discharge, no lesions   ?CERVIX: normal appearing cervix without discharge or lesions, no CMT ?UTERUS: uterus is felt to be normal size, shape, consistency and nontender  ?ADNEXA: No adnexal masses or tenderness noted. ?Extremities:  No swelling or varicosities noted ? ?Results for orders placed or performed in visit on 05/03/21 (from the past 24 hour(s))  ?POCT URINALYSIS DIP (CLINITEK)  ? Collection Time: 05/03/21 11:44 AM  ?Result Value Ref Range  ? Color, UA yellow yellow  ? Clarity, UA clear clear  ? Glucose, UA negative negative mg/dL  ? Bilirubin, UA negative negative  ? Ketones, POC UA negative negative mg/dL  ? Spec Grav, UA 1.015 1.010 - 1.025  ? Blood, UA trace-intact (A) negative  ? pH, UA 7.0 5.0 - 8.0  ? POC PROTEIN,UA negative negative, trace  ? Urobilinogen, UA 0.2 0.2 or 1.0 E.U./dL  ? Nitrite, UA Negative Negative  ? Leukocytes, UA Negative Negative  ?  ? ?Assessment & Plan:  ?[name] was seen today for gynecologic exam. ? ?Diagnoses and all orders for this visit: ? ?Cervical cancer screening ?-     Cytology - PAP() ? ?Hypertension, unspecified type ?BP goal - < 130/80 ?Explained that having normal blood pressure is the goal and medications are helping to get to goal and maintain normal blood pressure. ?DIET: Limit salt intake, read nutrition labels to check salt content, limit fried and high fatty foods  ?Avoid using multisymptom OTC cold preparations that generally contain sudafed which can rise BP. Consult with pharmacist on best  cold relief products to use for persons with HTN ?EXERCISE ?Discussed incorporating exercise such as walking - 30 minutes most days of the week and can do in 10 minute intervals    ? ?Screening for STD (sexually transmitted disease) ?-     Cervicovaginal ancillary only ?-     HIV antibody (with reflex) ? ?Birth control counseling ?Discussed and information placed  on AVS. Patient will review options and make a follow up appt. ? ?Colon cancer screening ?-     Cancel: Ambulatory referral to Gastroenterology/in progress  ? ?Dysuria ?-     POCT URINALYSIS DIP (CLINITEK) ? ?Encounter for screening mammogram for malignant neoplasm of breast ? ?-     MM 3D SCREEN BREAST BILATERAL; Future ? ?  ?Follow-up: Return in about 3 months (around 08/03/2021) for birth control/ Bp/labs. ? ?This note has been created with Surveyor, quantity. Any transcriptional errors are unintentional.  ? ?[name], NP ?05/03/2021, 2:54 PM  ?

## 2021-05-04 ENCOUNTER — Encounter: Payer: Medicaid Other | Admitting: Internal Medicine

## 2021-05-04 ENCOUNTER — Telehealth (INDEPENDENT_AMBULATORY_CARE_PROVIDER_SITE_OTHER): Payer: Self-pay | Admitting: Primary Care

## 2021-05-04 ENCOUNTER — Telehealth: Payer: Self-pay | Admitting: *Deleted

## 2021-05-04 DIAGNOSIS — Z1211 Encounter for screening for malignant neoplasm of colon: Secondary | ICD-10-CM

## 2021-05-04 NOTE — Telephone Encounter (Signed)
Pt is calling to let Sharyn Lull know that she has decided on the implant birth control. Please advise [phone]- [phone] ?

## 2021-05-04 NOTE — Telephone Encounter (Signed)
Pt came today without a care partner. She has no family here and her roommate can not take off work to be with her. She did have [employer] for transportation. She also did not pickup her prep from the pharmacy and thus wasn't prepped. Spoke with Dr. Lorenso Courier and Weber Cooks who has ok'd the patient to be here without a care partner. She is rescheduled for MAy 18 at 8am. ?

## 2021-05-04 NOTE — Telephone Encounter (Signed)
Please refer patient to GYN for birth control she would like implant.  ?

## 2021-05-05 ENCOUNTER — Telehealth: Payer: Self-pay

## 2021-05-05 ENCOUNTER — Other Ambulatory Visit (INDEPENDENT_AMBULATORY_CARE_PROVIDER_SITE_OTHER): Payer: Self-pay | Admitting: Primary Care

## 2021-05-05 DIAGNOSIS — Z789 Other specified health status: Secondary | ICD-10-CM

## 2021-05-05 NOTE — Telephone Encounter (Signed)
[facility] referring for nexplanon insertion had annual exam with PCP in 3-4 weeks. Sch w PA or CNM. Called and left voicemail for patient to call back to be scheduled.

## 2021-05-05 NOTE — Telephone Encounter (Signed)
-----   Message from Excell Seltzer, RN sent at 05/05/2021  1:15 PM EDT ----- Regarding: referral Schedule with any APP for nexplanon insertion had annual exam with PCP in 3-4 weeks.

## 2021-05-08 LAB — CERVICOVAGINAL ANCILLARY ONLY
Bacterial Vaginitis (gardnerella): NEGATIVE
Candida Glabrata: NEGATIVE
Candida Vaginitis: NEGATIVE
Chlamydia: NEGATIVE
Comment: NEGATIVE
Comment: NEGATIVE
Comment: NEGATIVE
Comment: NEGATIVE
Comment: NEGATIVE
Comment: NORMAL
Neisseria Gonorrhea: NEGATIVE
Trichomonas: NEGATIVE

## 2021-05-09 NOTE — Telephone Encounter (Signed)
Called and left voicemail for patient to call back to be scheduled. 

## 2021-05-10 ENCOUNTER — Ambulatory Visit
Admission: RE | Admit: 2021-05-10 | Discharge: 2021-05-10 | Disposition: A | Discharge status: Home / Self Care | Payer: Medicaid Other | Source: Ambulatory Visit | Attending: Primary Care | Admitting: Primary Care

## 2021-05-10 DIAGNOSIS — Z1231 Encounter for screening mammogram for malignant neoplasm of breast: Secondary | ICD-10-CM

## 2021-05-10 LAB — CYTOLOGY - PAP
Comment: NEGATIVE
Diagnosis: NEGATIVE
Diagnosis: REACTIVE
High risk HPV: POSITIVE — AB

## 2021-05-11 ENCOUNTER — Other Ambulatory Visit: Payer: Self-pay | Admitting: Primary Care

## 2021-05-11 DIAGNOSIS — R928 Other abnormal and inconclusive findings on diagnostic imaging of breast: Secondary | ICD-10-CM

## 2021-05-12 NOTE — Telephone Encounter (Signed)
Called and left voicemail for patient to call back to be scheduled. . Multiple attempts to reach patient have been unsuccessful. Contacting referring provider

## 2021-05-18 ENCOUNTER — Ambulatory Visit (AMBULATORY_SURGERY_CENTER): Payer: Medicaid Other | Admitting: Internal Medicine

## 2021-05-18 ENCOUNTER — Encounter: Payer: Self-pay | Admitting: Internal Medicine

## 2021-05-18 VITALS — BP 118/70 | HR 62 | Temp 97.8°F | Resp 14 | Ht 67.0 in | Wt 260.0 lb

## 2021-05-18 DIAGNOSIS — D120 Benign neoplasm of cecum: Secondary | ICD-10-CM

## 2021-05-18 DIAGNOSIS — Z1211 Encounter for screening for malignant neoplasm of colon: Secondary | ICD-10-CM

## 2021-05-18 MED ORDER — SODIUM CHLORIDE 0.9 % IV SOLN: 500.0000 mL | Freq: Once | INTRAVENOUS | Status: DC
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
## 2021-05-18 NOTE — Progress Notes (Signed)
Called to room to assist during endoscopic procedure.  Patient ID and intended procedure confirmed with present staff. Received instructions for my participation in the procedure from the performing physician.  

## 2021-05-18 NOTE — Progress Notes (Signed)
Pt's states no medical or surgical changes since previsit or office visit. 

## 2021-05-18 NOTE — Progress Notes (Signed)
Report to pacu rn; vss. ?

## 2021-05-18 NOTE — Op Note (Signed)
[name] Patient Name: [name] Procedure Date: 05/18/2021 8:06 AM MRN: [id number] Endoscopist: Sonny Masters "[name] ,  Age: 51 Referring MD:  Date of Birth: [birth year] Gender: Female Account #: [id number] Procedure:                Colonoscopy Indications:              Screening for colorectal malignant neoplasm, This                            is the patient's first colonoscopy Medicines:                Monitored Anesthesia Care Procedure:                Pre-Anesthesia Assessment:                           - Prior to the procedure, a History and Physical                            was performed, and patient medications and                            allergies were reviewed. The patient's tolerance of                            previous anesthesia was also reviewed. The risks                            and benefits of the procedure and the sedation                            options and risks were discussed with the patient.                            All questions were answered, and informed consent                            was obtained. Prior Anticoagulants: The patient has                            taken no previous anticoagulant or antiplatelet                            agents. ASA Grade Assessment: II - A patient with                            mild systemic disease. After reviewing the risks                            and benefits, the patient was deemed in                            satisfactory condition to undergo the procedure.  After obtaining informed consent, the colonoscope                            was passed under direct vision. Throughout the                            procedure, the patient's blood pressure, pulse, and                            oxygen saturations were monitored continuously. The                            Olympus CF-HQ190L (Serial# [id number]) Colonoscope was                            introduced through  the anus and advanced to the the                            terminal ileum. The colonoscopy was performed                            without difficulty. The patient tolerated the                            procedure well. The quality of the bowel                            preparation was good. The terminal ileum, ileocecal                            valve, appendiceal orifice, and rectum were                            photographed. Scope In: 8:16:26 AM Scope Out: 8:33:40 AM Scope Withdrawal Time: 0 hours 12 minutes 48 seconds  Total Procedure Duration: 0 hours 17 minutes 14 seconds  Findings:                 The terminal ileum appeared normal.                           Scattered diverticula were found in the ascending                            colon.                           A 4 mm polyp was found in the cecum. The polyp was                            sessile. The polyp was removed with a cold snare.                            Resection and retrieval were complete.  Non-bleeding internal hemorrhoids were found during                            retroflexion. Complications:            No immediate complications. Estimated Blood Loss:     Estimated blood loss was minimal. Impression:               - The examined portion of the ileum was normal.                           - Diverticulosis in the ascending colon.                           - One 4 mm polyp in the cecum, removed with a cold                            snare. Resected and retrieved.                           - Non-bleeding internal hemorrhoids. Recommendation:           - Discharge patient to home (with escort).                           - Await pathology results.                           - The findings and recommendations were discussed                            with the patient. Sonny Masters "[name],  05/18/2021 8:36:19 AM

## 2021-05-18 NOTE — Patient Instructions (Signed)
Thank you for coming in to see [place] today! Resume your normal diet and medications today. Return to normal daily activities tomorrow. Biopsy results will be available in 1-2 weeks and recommendation will be made at that time for next colonoscopy.  YOU HAD AN ENDOSCOPIC PROCEDURE TODAY AT [facility]:   Refer to the procedure report that was given to you for any specific questions about what was found during the examination.  If the procedure report does not answer your questions, please call your gastroenterologist to clarify.  If you requested that your care partner not be given the details of your procedure findings, then the procedure report has been included in a sealed envelope for you to review at your convenience later.  YOU SHOULD EXPECT: Some feelings of bloating in the abdomen. Passage of more gas than usual.  Walking can help get rid of the air that was put into your GI tract during the procedure and reduce the bloating. If you had a lower endoscopy (such as a colonoscopy or flexible sigmoidoscopy) you may notice spotting of blood in your stool or on the toilet paper. If you underwent a bowel prep for your procedure, you may not have a normal bowel movement for a few days.  Please Note:  You might notice some irritation and congestion in your nose or some drainage.  This is from the oxygen used during your procedure.  There is no need for concern and it should clear up in a day or so.  SYMPTOMS TO REPORT IMMEDIATELY:  Following lower endoscopy (colonoscopy or flexible sigmoidoscopy):  Excessive amounts of blood in the stool  Significant tenderness or worsening of abdominal pains  Swelling of the abdomen that is new, acute  Fever of 100F or higher   For urgent or emergent issues, a gastroenterologist can be reached at any hour by calling [phone]. Do not use MyChart messaging for urgent concerns.    DIET:  We do recommend a small meal at first, but then you may  proceed to your regular diet.  Drink plenty of fluids but you should avoid alcoholic beverages for 24 hours.  ACTIVITY:  You should plan to take it easy for the rest of today and you should NOT DRIVE or use heavy machinery until tomorrow (because of the sedation medicines used during the test).    FOLLOW UP: Our staff will call the number listed on your records 48-72 hours following your procedure to check on you and address any questions or concerns that you may have regarding the information given to you following your procedure. If we do not reach you, we will leave a message.  We will attempt to reach you two times.  During this call, we will ask if you have developed any symptoms of COVID 19. If you develop any symptoms (ie: fever, flu-like symptoms, shortness of breath, cough etc.) before then, please call [phone].  If you test positive for Covid 19 in the 2 weeks post procedure, please call and report this information to [place].    If any biopsies were taken you will be contacted by phone or by letter within the next 1-3 weeks.  Please call us at [phone] if you have not heard about the biopsies in 3 weeks.    SIGNATURES/CONFIDENTIALITY: You and/or your care partner have signed paperwork which will be entered into your electronic medical record.  These signatures attest to the fact that that the information above on your After Visit  Summary has been reviewed and is understood.  Full responsibility of the confidentiality of this discharge information lies with you and/or your care-partner.  

## 2021-05-19 ENCOUNTER — Telehealth: Payer: Self-pay | Admitting: *Deleted

## 2021-05-19 NOTE — Telephone Encounter (Signed)
  Follow up Call-     05/18/2021    7:16 AM  Call back number  Post procedure Call Back phone  # [phone]  Permission to leave phone message Yes     Patient questions:  Do you have a fever, pain , or abdominal swelling? No. Pain Score  0 *  Have you tolerated food without any problems? Yes.    Have you been able to return to your normal activities? Yes.    Do you have any questions about your discharge instructions: Diet   No. Medications  No. Follow up visit  No.  Do you have questions or concerns about your Care? No.  Actions: * If pain score is 4 or above: No action needed, pain <4.

## 2021-05-23 ENCOUNTER — Encounter: Payer: Self-pay | Admitting: Internal Medicine

## 2021-05-24 ENCOUNTER — Ambulatory Visit
Admission: RE | Admit: 2021-05-24 | Discharge: 2021-05-24 | Disposition: A | Discharge status: Home / Self Care | Payer: Medicaid Other | Source: Ambulatory Visit | Attending: Primary Care | Admitting: Primary Care

## 2021-05-24 DIAGNOSIS — R928 Other abnormal and inconclusive findings on diagnostic imaging of breast: Secondary | ICD-10-CM

## 2021-06-20 ENCOUNTER — Ambulatory Visit (INDEPENDENT_AMBULATORY_CARE_PROVIDER_SITE_OTHER): Payer: Medicaid Other

## 2021-06-20 ENCOUNTER — Encounter: Payer: Self-pay | Admitting: Orthopaedic Surgery

## 2021-06-20 ENCOUNTER — Ambulatory Visit (INDEPENDENT_AMBULATORY_CARE_PROVIDER_SITE_OTHER): Payer: Medicaid Other | Admitting: Orthopaedic Surgery

## 2021-06-20 ENCOUNTER — Ambulatory Visit: Payer: Self-pay

## 2021-06-20 DIAGNOSIS — M25512 Pain in left shoulder: Secondary | ICD-10-CM | POA: Diagnosis not present

## 2021-06-20 DIAGNOSIS — M25511 Pain in right shoulder: Secondary | ICD-10-CM

## 2021-06-20 DIAGNOSIS — G8929 Other chronic pain: Secondary | ICD-10-CM

## 2021-06-20 DIAGNOSIS — M542 Cervicalgia: Secondary | ICD-10-CM

## 2021-06-20 MED ORDER — METHOCARBAMOL 500 MG PO TABS: 500.0000 mg | ORAL_TABLET | Freq: Two times a day (BID) | ORAL | 2 refills | Status: DC | PRN

## 2021-06-20 NOTE — Progress Notes (Signed)
Office Visit Note   Patient: [name]           Date of Birth: 22-Nov-1970           MRN: [id number] Visit Date: 06/20/2021              Requested by: No referring provider defined for this encounter. PCP: [name], NP   Assessment & Plan: Visit Diagnoses:  1. Chronic pain of both shoulders     Plan: Impression is chronic left shoulder pain with bilateral upper extremity radiculopathy.  The patient has tried subacromial cortisone injections, steroid tapers, muscle relaxers as well as a guided home exercise program for both her shoulders and neck without relief.  We have discussed that her shoulder pain could in fact be coming from her neck but would like to get MRIs of both the cervical spine as well as her left shoulder as she had temporary relief from the subacromial cortisone injection.  She will follow-up with [place] once her MRIs been completed.  Follow-Up Instructions: Return for after MRI.   Orders:  Orders Placed This Encounter  Procedures   XR Cervical Spine 2 or 3 views   XR Shoulder Left   XR Shoulder Right   Meds ordered this encounter  Medications   methocarbamol (ROBAXIN) 500 MG tablet    Sig: Take 1 tablet (500 mg total) by mouth 2 (two) times daily as needed for muscle spasms.    Dispense:  20 tablet    Refill:  2      Procedures: No procedures performed   Clinical Data: No additional findings.   Subjective: Chief Complaint  Patient presents with   Right Leg - Follow-up   Right Shoulder - Pain   Left Shoulder - Pain    HPI patient is a pleasant 51 year old female who comes in today with recurrent bilateral shoulder pain left greater than right.  She has been dealing with this for several months after sustaining a fall from a set of steps landing on her left side.  She was seen by [place] in early April where subacromial cortisone injections were performed.  She has had good relief on the right but the left only helped for a few days.  The  pain she is having is described as a constant ache and is radiating from the top of the shoulders down both arms.  Pain is worse with sleeping on her side.  She has been taking tramadol with little relief.  She does note paresthesias to all 10 fingers.  She has worked on a guided home exercise program without relief.  Of note, she was seen for lumbar radiculopathy in late April where she was started on a steroid taper.  This helped the lumbar radiculopathy but not the pain in her shoulder/arm.  Review of Systems as detailed in HPI.  All others reviewed and are negative.   Objective: Vital Signs: There were no vitals taken for this visit.  Physical Exam well-developed well-nourished female no acute distress.  Alert and oriented x3.  Ortho Exam left shoulder exam reveals near full active range of motion, however she has pain with forward flexion past 120 degrees.  Internal rotation to L5.  Near full strength throughout.  Minimally positive empty can.  She is neurovascular intact distally.  Specialty Comments:  No specialty comments available.  Imaging: XR Shoulder Left  Result Date: 06/20/2021 No acute or structural abnormalities  XR Shoulder Right  Result Date: 06/20/2021 No acute  or structural abnormalities  XR Cervical Spine 2 or 3 views  Result Date: 06/20/2021 X-rays of the cervical spine demonstrate moderate degenerative changes C4-5 C5-6 C6-7 with straightening of the cervical spine    PMFS History: Patient Active Problem List   Diagnosis Date Noted   Suicidal behavior with attempted self-injury ([place]) 04/12/2020   Ingestion of toxic substance 04/11/2020   HTN (hypertension) 04/11/2020   Bipolar affective disorder, manic, severe, with psychotic behavior ([place]) 07/29/2019   Bipolar affective disorder, current episode manic ([place]) 05/18/2018   Bipolar affective disorder, current episode manic with psychotic symptoms ([place]) 05/18/2018   Uterine fibroid 02/06/2017   Bipolar  affective disorder, mixed, severe, with psychotic behavior ([place]) 12/28/2016   Bipolar I disorder, current or most recent episode manic, with psychotic features ([place]) 12/28/2016   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   Past Medical History:  Diagnosis Date   HTN (hypertension) 04/11/2020   on meds   Seasonal allergies    Sickle cell trait ([place])     Family History  Problem Relation Age of Onset   Breast cancer Mother        Deceased, 24   Hypertension Mother    Arthritis Father    Hypertension Father    Asthma Daughter    Diabetes type II Daughter    Healthy Son    Colon polyps Neg Hx    Colon cancer Neg Hx    Esophageal cancer Neg Hx    Stomach cancer Neg Hx    Rectal cancer Neg Hx     Past Surgical History:  Procedure Laterality Date   CESAREAN SECTION  2002, 2009   Social History   Occupational History   Not on file  Tobacco Use   Smoking status: Never   Smokeless tobacco: Never  Vaping Use   Vaping Use: Never used  Substance and Sexual Activity   Alcohol use: Not Currently   Drug use: Never   Sexual activity: Not on file

## 2021-07-07 ENCOUNTER — Ambulatory Visit
Admission: RE | Admit: 2021-07-07 | Discharge: 2021-07-07 | Disposition: A | Discharge status: Home / Self Care | Payer: Medicaid Other | Source: Ambulatory Visit | Attending: Orthopaedic Surgery | Admitting: Orthopaedic Surgery

## 2021-07-07 DIAGNOSIS — G8929 Other chronic pain: Secondary | ICD-10-CM

## 2021-07-07 DIAGNOSIS — M542 Cervicalgia: Secondary | ICD-10-CM

## 2021-07-14 ENCOUNTER — Encounter: Payer: Self-pay | Admitting: Orthopaedic Surgery

## 2021-07-14 ENCOUNTER — Ambulatory Visit (INDEPENDENT_AMBULATORY_CARE_PROVIDER_SITE_OTHER): Payer: Medicaid Other | Admitting: Orthopaedic Surgery

## 2021-07-14 DIAGNOSIS — M25511 Pain in right shoulder: Secondary | ICD-10-CM | POA: Diagnosis not present

## 2021-07-14 DIAGNOSIS — M25512 Pain in left shoulder: Secondary | ICD-10-CM

## 2021-07-14 DIAGNOSIS — G8929 Other chronic pain: Secondary | ICD-10-CM

## 2021-07-14 NOTE — Progress Notes (Signed)
Office Visit Note   Patient: [name]           Date of Birth: 1970-04-03           MRN: [id number] Visit Date: 07/14/2021              Requested by: [name], NP [address] [place],  [place] [address] PCP: [name], NP   Assessment & Plan: Visit Diagnoses:  1. Chronic pain of both shoulders     Plan: MRIs were reviewed.  Unclear if her symptoms are coming from her neck or shoulder.  She has had a subacromial injection already which did not provide any significant relief.  Based on her treatment options we agreed to try a cervical spine ESI.  Hopefully this will be therapeutic and diagnostic.  She will return if symptoms persist.  Follow-Up Instructions: No follow-ups on file.   Orders:  Orders Placed This Encounter  Procedures   Ambulatory referral to Physical Medicine Rehab   No orders of the defined types were placed in this encounter.     Procedures: No procedures performed   Clinical Data: No additional findings.   Subjective: Chief Complaint  Patient presents with   Left Shoulder - Follow-up    MRI review   Neck - Follow-up    MRI review    HPI Patient returns today to discuss recent MRI scans of the left shoulder and cervical spine. Review of Systems   Objective: Vital Signs: There were no vitals taken for this visit.  Physical Exam  Ortho Exam Examination of the left shoulder shows guarding with manual muscle testing.  No significant deficits of the rotator cuff. Negative Spurling sign.  Cervical spine is tender to palpation. Specialty Comments:  No specialty comments available.  Imaging: No results found.   PMFS History: Patient Active Problem List   Diagnosis Date Noted   Suicidal behavior with attempted self-injury ([place]) 04/12/2020   Ingestion of toxic substance 04/11/2020   HTN (hypertension) 04/11/2020   Bipolar affective disorder, manic, severe, with psychotic behavior ([place]) 07/29/2019    Bipolar affective disorder, current episode manic ([place]) 05/18/2018   Bipolar affective disorder, current episode manic with psychotic symptoms ([place]) 05/18/2018   Uterine fibroid 02/06/2017   Bipolar affective disorder, mixed, severe, with psychotic behavior ([place]) 12/28/2016   Bipolar I disorder, current or most recent episode manic, with psychotic features ([place]) 12/28/2016   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   Past Medical History:  Diagnosis Date   HTN (hypertension) 04/11/2020   on meds   Seasonal allergies    Sickle cell trait ([place])     Family History  Problem Relation Age of Onset   Breast cancer Mother        Deceased, 46   Hypertension Mother    Arthritis Father    Hypertension Father    Asthma Daughter    Diabetes type II Daughter    Healthy Son    Colon polyps Neg Hx    Colon cancer Neg Hx    Esophageal cancer Neg Hx    Stomach cancer Neg Hx    Rectal cancer Neg Hx     Past Surgical History:  Procedure Laterality Date   CESAREAN SECTION  2002, 2009   Social History   Occupational History   Not on file  Tobacco Use   Smoking status: Never   Smokeless tobacco: Never  Vaping Use   Vaping Use: Never  used  Substance and Sexual Activity   Alcohol use: Not Currently   Drug use: Never   Sexual activity: Not on file

## 2021-08-01 ENCOUNTER — Ambulatory Visit (INDEPENDENT_AMBULATORY_CARE_PROVIDER_SITE_OTHER): Payer: Medicaid Other | Admitting: Physical Medicine and Rehabilitation

## 2021-08-01 ENCOUNTER — Ambulatory Visit: Payer: Self-pay

## 2021-08-01 ENCOUNTER — Ambulatory Visit (INDEPENDENT_AMBULATORY_CARE_PROVIDER_SITE_OTHER): Payer: Medicaid Other | Admitting: Primary Care

## 2021-08-01 DIAGNOSIS — M5412 Radiculopathy, cervical region: Secondary | ICD-10-CM | POA: Diagnosis not present

## 2021-08-01 MED ORDER — METHYLPREDNISOLONE ACETATE 80 MG/ML IJ SUSP
80.0000 mg | Freq: Once | INTRAMUSCULAR | Status: AC
  Administered 2021-08-01: 80 mg

## 2021-08-01 NOTE — Progress Notes (Signed)
Numeric Pain Rating Scale and Functional Assessment Average Pain 8   In the last MONTH (on 0-10 scale) has pain interfered with the following?  1. General activity like being  able to carry out your everyday physical activities such as walking, climbing stairs, carrying groceries, or moving a chair?  Rating(8)   +Driver, -BT, -Dye Allergies. Transportation will drive home.

## 2021-08-01 NOTE — Patient Instructions (Signed)

## 2021-08-03 ENCOUNTER — Encounter (INDEPENDENT_AMBULATORY_CARE_PROVIDER_SITE_OTHER): Payer: Self-pay | Admitting: Primary Care

## 2021-08-03 ENCOUNTER — Ambulatory Visit (INDEPENDENT_AMBULATORY_CARE_PROVIDER_SITE_OTHER): Payer: Medicaid Other | Admitting: Primary Care

## 2021-08-03 VITALS — BP 157/103 | HR 60 | Temp 98.1°F | Resp 16 | Wt 275.2 lb

## 2021-08-03 DIAGNOSIS — I10 Essential (primary) hypertension: Secondary | ICD-10-CM

## 2021-08-03 DIAGNOSIS — Z6841 Body Mass Index (BMI) 40.0 and over, adult: Secondary | ICD-10-CM

## 2021-08-03 DIAGNOSIS — Z23 Encounter for immunization: Secondary | ICD-10-CM | POA: Diagnosis not present

## 2021-08-03 DIAGNOSIS — Z13220 Encounter for screening for lipoid disorders: Secondary | ICD-10-CM

## 2021-08-03 MED ORDER — SPIRONOLACTONE 50 MG PO TABS: 50.0000 mg | ORAL_TABLET | Freq: Every day | ORAL | 0 refills | Status: DC

## 2021-08-03 NOTE — Patient Instructions (Signed)
Your blood pressure remains high continue your current blood pressure regiment of medication.  Added spironolactone 50 mg in the morning.  Return for blood pressure recheck in 2 to 3 weeks and we will discuss obstructive sleep apnea.

## 2021-08-03 NOTE — Progress Notes (Signed)
[place], is a 51 y.o. female  XLK:[id number]  DGU:[id number]  DOB - 1970/10/10  No chief complaint on file.      Subjective:   [name] is a 51 y.o. morbid obese female here today for a follow up visit for the management hypertension. Patient has No headache, No chest pain, No abdominal pain - No Nausea, No new weakness tingling or numbness, No Cough - shortness of breath.  Patient endorses taking medication as prescribed daily.  Blood pressure remains significantly elevated.  Consult with cardiology reviewed current medication she was taking advised to add spironolactone 50 mg in the morning have her to return in 2 to 3 weeks for labs and blood pressure check.  Also, at this follow-up visit rule out obstructive sleep apnea.  No problems updated.  Allergies  Allergen Reactions   Latex    Risperdal [Risperidone] Other (See Comments)    Caused eyelids to relax requiring treatment    Past Medical History:  Diagnosis Date   HTN (hypertension) 04/11/2020   on meds   Seasonal allergies    Sickle cell trait ([place])     Current Outpatient Medications on File Prior to Visit  Medication Sig Dispense Refill   amLODipine (NORVASC) 10 MG tablet Take 1 tablet (10 mg total) by mouth daily. 90 tablet 3   Blood Pressure Monitor MISC Use to check blood pressure 3 times daily. 1 each 0   [facility] NATURAL TEARS PF 0.1-0.3 % SOLN Apply 2 drops to eye 4 (four) times daily.     divalproex (DEPAKOTE) 250 MG DR tablet Take 1 tablet by mouth daily at 6 (six) AM.     FLUoxetine (PROZAC) 20 MG capsule Take 1 capsule by mouth daily.     losartan-hydrochlorothiazide (HYZAAR) 100-25 MG tablet Take 1 tablet by mouth daily. 90 tablet 3   methocarbamol (ROBAXIN) 500 MG tablet Take 1 tablet (500 mg total) by mouth 2 (two) times daily as needed. 20 tablet 0   methocarbamol (ROBAXIN) 500 MG tablet Take 1 tablet (500 mg total) by mouth 2 (two) times daily as needed for  muscle spasms. 20 tablet 2   perphenazine (TRILAFON) 4 MG tablet Take 1 tablet by mouth at bedtime.     propranolol (INDERAL) 20 MG tablet Take 1 tablet (20 mg total) by mouth 2 (two) times daily. 180 tablet 1   PROZAC 20 MG capsule Take 20 mg by mouth daily.     traMADol (ULTRAM) 50 MG tablet Take 1 tablet (50 mg total) by mouth every 12 (twelve) hours as needed. 30 tablet 2   traZODone (DESYREL) 100 MG tablet Take 100 mg by mouth at bedtime.     Current Facility-Administered Medications on File Prior to Visit  Medication Dose Route Frequency Provider Last Rate Last Admin   methylPREDNISolone acetate (DEPO-MEDROL) injection 80 mg  80 mg Other Once Magnus Sinning, MD        Objective:   Vitals:   08/03/21 1113 08/03/21 1124  BP: (!) 151/104 (!) 157/103  Pulse: 60   Resp: 16   Temp: 98.1 F (36.7 C)   TempSrc: Oral   SpO2: 94%   Weight: 275 lb 3.2 oz (124.8 kg)     Exam General appearance : Awake, alert, not in any distress. Speech Clear. Not toxic looking HEENT: Atraumatic and Normocephalic,EOM Neck: Supple, no JVD. No cervical lymphadenopathy.  Chest: Good air entry bilaterally, no added sounds  CVS: S1 S2 regular, no murmurs.  Abdomen: Bowel sounds present, Non tender and not distended with no gaurding, rigidity or rebound. Extremities: B/L Lower Ext shows no edema, both legs are warm to touch Neurology: Awake alert, and oriented X 3, Non focal Skin: No Rash  Data Review Lab Results  Component Value Date   HGBA1C 5.2 02/04/2020   HGBA1C 5.0 07/30/2019   HGBA1C 5.1 12/29/2016    Assessment & Plan   1. Need for diphtheria-tetanus-pertussis (Tdap) vaccine completed  2. Essential hypertension Counseled on blood pressure goal of less than 130/80, low-sodium, DASH diet, medication compliance, 150 minutes of moderate intensity exercise per week. Discussed medication compliance, adverse effects.  Continue Hyzaar 100/25, amlodipine 10 mg and propanolol 20 mg twice  daily.  Added spironolactone 50 mg every morning will return for follow-up BP and fasting lipids - CBC with Differential; Future - CMP14+EGFR; Future  3. Lipid screening  Healthy lifestyle diet of fruits vegetables fish nuts whole grains and low saturated fat . Foods high in cholesterol or liver, fatty meats,cheese, butter avocados, nuts and seeds, chocolate and fried foods. - Lipid panel; Future  Morbid obesity (HCC) BMI greater than 40 is classification of morbid obesity she already has other comorbidities elevated blood pressure Patient have been counseled extensively about nutrition and exercise. Other issues discussed during this visit include: low cholesterol diet, weight control and daily exercise, foot care, annual eye examinations at Ophthalmology, importance of adherence with medications and regular follow-up. We also discussed long term complications of uncontrolled diabetes and hypertension.   Return in about 3 weeks (around 08/24/2021) for BP/labs fasting.  The patient was given clear instructions to go to ER or return to medical center if symptoms don't improve, worsen or new problems develop. The patient verbalized understanding. The patient was told to call to get lab results if they haven't heard anything in the next week.   This note has been created with Surveyor, quantity. Any transcriptional errors are unintentional.   [name], NP 08/03/2021, 12:18 PM

## 2021-08-13 NOTE — Progress Notes (Signed)
Catilyn Boggus - 51 y.o. female MRN [id number]  Date of birth: 1970/10/09  Office Visit Note: Visit Date: 08/01/2021 PCP: [name], NP Referred by: [name], NP  Subjective: Chief Complaint  Patient presents with   Neck - Pain   Right Shoulder - Pain   Left Shoulder - Pain   HPI:  Shontell Prosser is a 52 y.o. female who comes in today at the request of Dr. Eduard Roux for planned Left C7-T1 Cervical Interlaminar epidural steroid injection with fluoroscopic guidance.  The patient has failed conservative care including home exercise, medications, time and activity modification.  This injection will be diagnostic and hopefully therapeutic.  Please see requesting physician notes for further details and justification.   ROS Otherwise per HPI.  Assessment & Plan: Visit Diagnoses:    ICD-10-CM   1. Cervical radiculopathy  M54.12 XR C-ARM NO REPORT    Epidural Steroid injection    methylPREDNISolone acetate (DEPO-MEDROL) injection 80 mg      Plan: No additional findings.   Meds & Orders:  Meds ordered this encounter  Medications   methylPREDNISolone acetate (DEPO-MEDROL) injection 80 mg    Orders Placed This Encounter  Procedures   XR C-ARM NO REPORT   Epidural Steroid injection    Follow-up: Return for visit to requesting provider as needed.   Procedures: No procedures performed  Cervical Epidural Steroid Injection - Interlaminar Approach with Fluoroscopic Guidance  Patient: Tenishia Ekman      Date of Birth: February 02, 1970 MRN: [id number] PCP: [name], NP      Visit Date: 08/01/2021   Universal Protocol:    Date/Time: 08/13/2309:30 AM  Consent Given By: the patient  Position: PRONE  Additional Comments: Vital signs were monitored before and after the procedure. Patient was prepped and draped in the usual sterile fashion. The correct patient, procedure, and site was verified.   Injection Procedure Details:   Procedure  diagnoses: Cervical radiculopathy [M54.12]    Meds Administered:  Meds ordered this encounter  Medications   methylPREDNISolone acetate (DEPO-MEDROL) injection 80 mg     Laterality: Left  Location/Site: C7-T1  Needle: 3.5 in., 20 ga. Tuohy  Needle Placement: Paramedian epidural space  Findings:  -Comments: Excellent flow of contrast into the epidural space.  Procedure Details: Using a paramedian approach from the side mentioned above, the region overlying the inferior lamina was localized under fluoroscopic visualization and the soft tissues overlying this structure were infiltrated with 4 ml. of 1% Lidocaine without Epinephrine. A # 20 gauge, Tuohy needle was inserted into the epidural space using a paramedian approach.  The epidural space was localized using loss of resistance along with contralateral oblique bi-planar fluoroscopic views.  After negative aspirate for air, blood, and CSF, a 2 ml. volume of Isovue-250 was injected into the epidural space and the flow of contrast was observed. Radiographs were obtained for documentation purposes.   The injectate was administered into the level noted above.  Additional Comments:  No complications occurred Dressing: 2 x 2 sterile gauze and Band-Aid    Post-procedure details: Patient was observed during the procedure. Post-procedure instructions were reviewed.  Patient left the clinic in stable condition.   Clinical History: MRI CERVICAL SPINE WITHOUT CONTRAST   TECHNIQUE: Multiplanar, multisequence MR imaging of the cervical spine was performed. No intravenous contrast was administered.   COMPARISON:  None Available.   FINDINGS: Alignment: Physiologic.   Vertebrae: No acute fracture, evidence of discitis, or aggressive bone lesion.   Cord:  Normal signal and morphology.   Posterior Fossa, vertebral arteries, paraspinal tissues: Posterior fossa demonstrates no focal abnormality. Vertebral artery flow voids are  maintained. Paraspinal soft tissues are unremarkable.   Disc levels:   Discs: Minimal disc height loss at C5-6. Remainder of the disc spaces are maintained.   C2-3: No disc protrusion. Mild bilateral foraminal narrowing. No central canal stenosis.   C3-4: No disc protrusion. Bilateral uncovertebral degenerative changes. Mild-moderate right foraminal stenosis. Mild left foraminal stenosis. No spinal stenosis.   C4-5: Broad-based disc bulge with a broad right paracentral disc protrusion. Bilateral uncovertebral degenerative changes. Severe right foraminal stenosis. Moderate-severe left foraminal stenosis. Moderate spinal stenosis.   C5-6: Mild broad-based disc bulge with a small right paracentral disc protrusion. Bilateral uncovertebral degenerative changes. Severe right foraminal stenosis. Moderate-severe left foraminal stenosis. No spinal stenosis.   C6-7: Mild broad-based disc bulge. Moderate right foraminal stenosis. No left foraminal stenosis. No spinal stenosis.   C7-T1: No significant disc bulge. No neural foraminal stenosis. No central canal stenosis.   IMPRESSION: 1. Diffuse cervical spine spondylosis as described above. 2.  No acute osseous injury of the cervical spine.     Electronically Signed   By: Kathreen Devoid M.D.   On: 07/08/2021 07:41     Objective:  VS:  HT:    WT:   BMI:     BP:   HR: bpm  TEMP: ( )  RESP:  Physical Exam Vitals and nursing note reviewed.  Constitutional:      General: She is not in acute distress.    Appearance: Normal appearance. She is not ill-appearing.  HENT:     Head: Normocephalic and atraumatic.     Right Ear: External ear normal.     Left Ear: External ear normal.  Eyes:     Extraocular Movements: Extraocular movements intact.  Cardiovascular:     Rate and Rhythm: Normal rate.     Pulses: Normal pulses.  Musculoskeletal:     Cervical back: Tenderness present. No rigidity.     Right lower leg: No edema.     Left  lower leg: No edema.     Comments: Patient has good strength in the upper extremities including 5 out of 5 strength in wrist extension long finger flexion and APB.  There is no atrophy of the hands intrinsically.  There is a negative Hoffmann's test.   Lymphadenopathy:     Cervical: No cervical adenopathy.  Skin:    Findings: No erythema, lesion or rash.  Neurological:     General: No focal deficit present.     Mental Status: She is alert and oriented to person, place, and time.     Sensory: No sensory deficit.     Motor: No weakness or abnormal muscle tone.     Coordination: Coordination normal.  Psychiatric:        Mood and Affect: Mood normal.        Behavior: Behavior normal.      Imaging: No results found.

## 2021-08-13 NOTE — Procedures (Signed)
Cervical Epidural Steroid Injection - Interlaminar Approach with Fluoroscopic Guidance  Patient: [name]      Date of Birth: Jul 19, 1970 MRN: [id number] PCP: [name], NP      Visit Date: 08/01/2021   Universal Protocol:    Date/Time: 08/13/2309:30 AM  Consent Given By: the patient  Position: PRONE  Additional Comments: Vital signs were monitored before and after the procedure. Patient was prepped and draped in the usual sterile fashion. The correct patient, procedure, and site was verified.   Injection Procedure Details:   Procedure diagnoses: Cervical radiculopathy [M54.12]    Meds Administered:  Meds ordered this encounter  Medications   methylPREDNISolone acetate (DEPO-MEDROL) injection 80 mg     Laterality: Left  Location/Site: C7-T1  Needle: 3.5 in., 20 ga. Tuohy  Needle Placement: Paramedian epidural space  Findings:  -Comments: Excellent flow of contrast into the epidural space.  Procedure Details: Using a paramedian approach from the side mentioned above, the region overlying the inferior lamina was localized under fluoroscopic visualization and the soft tissues overlying this structure were infiltrated with 4 ml. of 1% Lidocaine without Epinephrine. A # 20 gauge, Tuohy needle was inserted into the epidural space using a paramedian approach.  The epidural space was localized using loss of resistance along with contralateral oblique bi-planar fluoroscopic views.  After negative aspirate for air, blood, and CSF, a 2 ml. volume of Isovue-250 was injected into the epidural space and the flow of contrast was observed. Radiographs were obtained for documentation purposes.   The injectate was administered into the level noted above.  Additional Comments:  No complications occurred Dressing: 2 x 2 sterile gauze and Band-Aid    Post-procedure details: Patient was observed during the procedure. Post-procedure instructions were  reviewed.  Patient left the clinic in stable condition.

## 2021-08-25 ENCOUNTER — Ambulatory Visit (INDEPENDENT_AMBULATORY_CARE_PROVIDER_SITE_OTHER): Payer: Medicaid Other | Admitting: Primary Care

## 2021-08-25 ENCOUNTER — Encounter (INDEPENDENT_AMBULATORY_CARE_PROVIDER_SITE_OTHER): Payer: Self-pay | Admitting: Primary Care

## 2021-08-25 VITALS — BP 105/76 | HR 62 | Temp 98.0°F | Ht 67.0 in | Wt 260.0 lb

## 2021-08-25 DIAGNOSIS — I10 Essential (primary) hypertension: Secondary | ICD-10-CM
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
## 2021-08-25 NOTE — Patient Instructions (Signed)

## 2021-11-06 ENCOUNTER — Other Ambulatory Visit (INDEPENDENT_AMBULATORY_CARE_PROVIDER_SITE_OTHER): Payer: Self-pay | Admitting: Primary Care

## 2021-11-06 NOTE — Telephone Encounter (Signed)
Requested Prescriptions  Pending Prescriptions Disp Refills   spironolactone (ALDACTONE) 50 MG tablet [Pharmacy Med Name: SPIRONOLACTONE 50MG TABLETS] 90 tablet 1    Sig: TAKE 1 TABLET(50 MG) BY MOUTH DAILY     Cardiovascular: Diuretics - Aldosterone Antagonist Failed - 11/06/2021  6:50 AM      Failed - Cr in normal range and within 180 days    Creatinine, Ser  Date Value Ref Range Status  02/01/2021 0.76 0.57 - 1.00 mg/dL Final         Failed - K in normal range and within 180 days    Potassium  Date Value Ref Range Status  02/01/2021 3.6 3.5 - 5.2 mmol/L Final         Failed - Na in normal range and within 180 days    Sodium  Date Value Ref Range Status  02/01/2021 141 134 - 144 mmol/L Final         Failed - eGFR is 30 or above and within 180 days    GFR calc Af Amer  Date Value Ref Range Status  02/04/2020 122 >59 mL/min/1.73 Final    Comment:    **In accordance with recommendations from the NKF-ASN Task force,**   [facility] is in the process of updating its eGFR calculation to the   2021 CKD-EPI creatinine equation that estimates kidney function   without a race variable.    GFR, Estimated  Date Value Ref Range Status  04/14/2020 >60 >60 mL/min Final    Comment:    (NOTE) Calculated using the CKD-EPI Creatinine Equation (2021)    eGFR  Date Value Ref Range Status  02/01/2021 97 >59 mL/min/1.73 Final         Passed - Last BP in normal range    BP Readings from Last 1 Encounters:  08/25/21 105/76         Passed - Valid encounter within last 6 months    Recent Outpatient Visits           2 months ago Essential hypertension   [place] [name], NP   3 months ago Need for diphtheria-tetanus-pertussis (Tdap) vaccine   [facility] [name], NP   6 months ago Cervical cancer screening   [place] [name] P, NP   7 months ago Pain of both shoulder joints    [place] [name], NP   9 months ago Need for immunization against influenza   [employer] [place], NP       Future Appointments             In 3 weeks Oletta Lamas [place] Cage, NP [place]

## 2021-11-28 ENCOUNTER — Ambulatory Visit (INDEPENDENT_AMBULATORY_CARE_PROVIDER_SITE_OTHER): Payer: Medicaid Other | Admitting: Primary Care

## 2021-12-15 ENCOUNTER — Ambulatory Visit (INDEPENDENT_AMBULATORY_CARE_PROVIDER_SITE_OTHER): Payer: Medicaid Other | Admitting: Primary Care

## 2021-12-15 ENCOUNTER — Encounter (INDEPENDENT_AMBULATORY_CARE_PROVIDER_SITE_OTHER): Payer: Self-pay | Admitting: Primary Care

## 2021-12-15 VITALS — BP 102/64 | HR 69 | Resp 16 | Ht 67.0 in | Wt 266.0 lb

## 2021-12-15 DIAGNOSIS — G8929 Other chronic pain: Secondary | ICD-10-CM

## 2021-12-15 DIAGNOSIS — I10 Essential (primary) hypertension: Secondary | ICD-10-CM

## 2021-12-15 DIAGNOSIS — Z114 Encounter for screening for human immunodeficiency virus [HIV]: Secondary | ICD-10-CM | POA: Diagnosis not present

## 2021-12-15 DIAGNOSIS — Z1159 Encounter for screening for other viral diseases: Secondary | ICD-10-CM

## 2021-12-15 DIAGNOSIS — M545 Low back pain, unspecified: Secondary | ICD-10-CM

## 2021-12-15 MED ORDER — METHOCARBAMOL 500 MG PO TABS: 500.0000 mg | ORAL_TABLET | Freq: Four times a day (QID) | ORAL | 0 refills | Status: AC | PRN

## 2021-12-16 LAB — HIV ANTIBODY (ROUTINE TESTING W REFLEX): HIV Screen 4th Generation wRfx: NONREACTIVE

## 2021-12-16 LAB — HCV AB W REFLEX TO QUANT PCR: HCV Ab: NONREACTIVE

## 2021-12-16 LAB — HCV INTERPRETATION

## 2021-12-26 NOTE — Progress Notes (Signed)
Level Green, is a 51 y.o. female  KXF:[id number]  IRC:[id number]  DOB - [birth year]  Chief Complaint  Patient presents with   Hypertension   Back Pain       Subjective:   [name] is a 51 y.o. female here today for a follow up for hypertension.  Patient has No headache, No chest pain, No abdominal pain - No Nausea, No new weakness tingling or numbness, No Cough - shortness of breath.  She also complains of back pain mid back lumbar to sacral.  She denies any falls or traumas recently.  Not able to correlate when have the back pain started.  She denies radiating from her back down her legs.  No problems updated.  Allergies  Allergen Reactions   Latex    Risperdal [Risperidone] Other (See Comments)    Caused eyelids to relax requiring treatment    Past Medical History:  Diagnosis Date   HTN (hypertension) 04/11/2020   on meds   Seasonal allergies    Sickle cell trait ([place])     Current Outpatient Medications on File Prior to Visit  Medication Sig Dispense Refill   amLODipine (NORVASC) 10 MG tablet Take 1 tablet (10 mg total) by mouth daily. 90 tablet 3   [facility] NATURAL TEARS PF 0.1-0.3 % SOLN Apply 2 drops to eye 4 (four) times daily.     divalproex (DEPAKOTE) 250 MG DR tablet Take 1 tablet by mouth daily at 6 (six) AM.     FLUoxetine (PROZAC) 20 MG capsule Take 1 capsule by mouth daily.     losartan-hydrochlorothiazide (HYZAAR) 100-25 MG tablet Take 1 tablet by mouth daily. 90 tablet 3   perphenazine (TRILAFON) 4 MG tablet Take 1 tablet by mouth at bedtime.     propranolol (INDERAL) 20 MG tablet Take 1 tablet (20 mg total) by mouth 2 (two) times daily. 180 tablet 1   PROZAC 20 MG capsule Take 20 mg by mouth daily.     spironolactone (ALDACTONE) 50 MG tablet TAKE 1 TABLET(50 MG) BY MOUTH DAILY 90 tablet 1   traMADol (ULTRAM) 50 MG tablet Take 1 tablet (50 mg total) by mouth every 12 (twelve) hours as needed. 30 tablet 2   traZODone  (DESYREL) 100 MG tablet Take 100 mg by mouth at bedtime.     No current facility-administered medications on file prior to visit.    Objective:   Vitals:   12/15/21 0925 12/15/21 0932  BP: 95/66 102/64  Pulse: 69   Resp: 16   SpO2: 100%   Weight: 266 lb (120.7 kg)   Height: '5\' 7"'$  (1.702 m)     Exam General appearance : Awake, alert, not in any distress. Speech Clear. Not toxic looking HEENT: Atraumatic and Normocephalic,  Neck: Supple, no JVD. No cervical lymphadenopathy.  Chest: Good air entry bilaterally, no added sounds  CVS: S1 S2 regular, no murmurs.  Abdomen: Bowel sounds present, Non tender and not distended with no gaurding, rigidity or rebound. Extremities: B/L Lower Ext shows no edema, both legs are warm to touch Neurology: Awake alert, and oriented , Non focal Skin: No Rash  Data Review Lab Results  Component Value Date   HGBA1C 5.2 02/04/2020   HGBA1C 5.0 07/30/2019   HGBA1C 5.1 12/29/2016    Assessment & Plan   1. Chronic bilateral low back pain, unspecified whether sciatica present  - methocarbamol (ROBAXIN) 500 MG tablet; Take 1 tablet (500 mg total) by mouth every 6 (six) hours  as needed for muscle spasms.  Dispense: 90 tablet; Refill: 0  2. Essential hypertension Blood pressure is well-controlled 102/64 if blood pressure continues to be low normal range will consider discontinuing amlodipine and reducing Hyzaar  3. Encounter for HCV screening test for low risk patient Health maintenance - HCV Ab w Reflex to Quant PCR  4. Encounter for screening for HIV Health maintenance - HIV Antibody (routine testing w rflx)  5. Morbid obesity ([place]) Morbid obesity is being greater than 40 indicating an excess in caloric intake or underlining conditions. This may lead to other co-morbidities. Educated on lifestyle modifications of diet and exercise which may reduce obesity.   Will follow-up next year after the holidays for further discussion on exercising  monitoring eating habits, no processed foods or and fast foods restaurants    Patient have been counseled extensively about nutrition and exercise. Other issues discussed during this visit include: low cholesterol diet, weight control and daily exercise, foot care, annual eye examinations at Ophthalmology, importance of adherence with medications and regular follow-up. We also discussed long term complications of uncontrolled diabetes and hypertension.   No follow-ups on file.  The patient was given clear instructions to go to ER or return to medical center if symptoms don't improve, worsen or new problems develop. The patient verbalized understanding. The patient was told to call to get lab results if they haven't heard anything in the next week.   This note has been created with Surveyor, quantity. Any transcriptional errors are unintentional.   [name], NP 12/26/2021, 9:44 PM

## 2022-01-24 ENCOUNTER — Other Ambulatory Visit (INDEPENDENT_AMBULATORY_CARE_PROVIDER_SITE_OTHER): Payer: Self-pay | Admitting: Family Medicine

## 2022-05-04 ENCOUNTER — Other Ambulatory Visit (INDEPENDENT_AMBULATORY_CARE_PROVIDER_SITE_OTHER): Payer: Self-pay | Admitting: Primary Care

## 2022-05-04 DIAGNOSIS — I10 Essential (primary) hypertension: Secondary | ICD-10-CM

## 2022-05-04 DIAGNOSIS — Z760 Encounter for issue of repeat prescription: Secondary | ICD-10-CM

## 2022-05-04 NOTE — Telephone Encounter (Signed)
Requested medication (s) are due for refill today: expired medication   Requested medication (s) are on the active medication list: yes   Last refill:  spiraladctone 11/06/21 #90 1 refill hyzaar 03/28/21 #90 3 refills  Future visit scheduled: yes 1 month   Notes to clinic:   protocol failed. Last labs 02/01/21. Expired medication . Do you want to give courtesy refills?     Requested Prescriptions  Pending Prescriptions Disp Refills   spironolactone (ALDACTONE) 50 MG tablet [Pharmacy Med Name: SPIRONOLACTONE 50MG  TABLETS] 90 tablet 1    Sig: TAKE 1 TABLET(50 MG) BY MOUTH DAILY     Cardiovascular: Diuretics - Aldosterone Antagonist Failed - 05/04/2022  1:25 PM      Failed - Cr in normal range and within 180 days    Creatinine, Ser  Date Value Ref Range Status  02/01/2021 0.76 0.57 - 1.00 mg/dL Final         Failed - K in normal range and within 180 days    Potassium  Date Value Ref Range Status  02/01/2021 3.6 3.5 - 5.2 mmol/L Final         Failed - Na in normal range and within 180 days    Sodium  Date Value Ref Range Status  02/01/2021 141 134 - 144 mmol/L Final         Failed - eGFR is 30 or above and within 180 days    GFR calc Af Amer  Date Value Ref Range Status  02/04/2020 122 >59 mL/min/1.73 Final    Comment:    **In accordance with recommendations from the NKF-ASN Task force,**   [facility] is in the process of updating its eGFR calculation to the   2021 CKD-EPI creatinine equation that estimates kidney function   without a race variable.    GFR, Estimated  Date Value Ref Range Status  04/14/2020 >60 >60 mL/min Final    Comment:    (NOTE) Calculated using the CKD-EPI Creatinine Equation (2021)    eGFR  Date Value Ref Range Status  02/01/2021 97 >59 mL/min/1.73 Final         Passed - Last BP in normal range    BP Readings from Last 1 Encounters:  12/15/21 102/64         Passed - Valid encounter within last 6 months    Recent Outpatient Visits            4 months ago Chronic bilateral low back pain, unspecified whether sciatica present   [place] [facility] Grayce Sessions, NP   8 months ago Essential hypertension   [place] [facility] Grayce Sessions, NP   9 months ago Need for diphtheria-tetanus-pertussis (Tdap) vaccine   [place] [facility] Grayce Sessions, NP   1 year ago Cervical cancer screening   [place] [facility] Grayce Sessions, NP   1 year ago Pain of both shoulder joints   [place] [facility] Grayce Sessions, NP       Future Appointments             In 1 month Randa Evens, Kinnie Scales, NP [place] [facility]             losartan-hydrochlorothiazide (HYZAAR) 100-25 MG tablet [Pharmacy Med Name: LOSARTAN/HCTZ 100/25MG  TABLETS] 90 tablet 3    Sig: Take 1 tablet by mouth daily.     Cardiovascular: ARB + Diuretic Combos Failed - 05/04/2022  1:25 PM      Failed - K in normal range and within 180 days    Potassium  Date Value Ref Range Status  02/01/2021 3.6 3.5 - 5.2 mmol/L Final         Failed - Na in normal range and within 180 days    Sodium  Date Value Ref Range Status  02/01/2021 141 134 - 144 mmol/L Final         Failed - Cr in normal range and within 180 days    Creatinine, Ser  Date Value Ref Range Status  02/01/2021 0.76 0.57 - 1.00 mg/dL Final         Failed - eGFR is 10 or above and within 180 days    GFR calc Af Amer  Date Value Ref Range Status  02/04/2020 122 >59 mL/min/1.73 Final    Comment:    **In accordance with recommendations from the NKF-ASN Task force,**   [facility] is in the process of updating its eGFR calculation to the   2021 CKD-EPI creatinine equation that estimates kidney function   without a race variable.    GFR, Estimated  Date Value Ref Range Status  04/14/2020 >60 >60 mL/min Final    Comment:    (NOTE) Calculated using the CKD-EPI  Creatinine Equation (2021)    eGFR  Date Value Ref Range Status  02/01/2021 97 >59 mL/min/1.73 Final         Passed - Patient is not pregnant      Passed - Last BP in normal range    BP Readings from Last 1 Encounters:  12/15/21 102/64         Passed - Valid encounter within last 6 months    Recent Outpatient Visits           4 months ago Chronic bilateral low back pain, unspecified whether sciatica present   [place] [facility] Grayce Sessions, NP   8 months ago Essential hypertension   [place] [facility] Grayce Sessions, NP   9 months ago Need for diphtheria-tetanus-pertussis (Tdap) vaccine   [place] [facility] Grayce Sessions, NP   1 year ago Cervical cancer screening   [place] [facility] Grayce Sessions, NP   1 year ago Pain of both shoulder joints   [place] [facility] Grayce Sessions, NP       Future Appointments             In 1 month Randa Evens, Kinnie Scales, NP [place] [facility]

## 2022-05-10 ENCOUNTER — Other Ambulatory Visit (INDEPENDENT_AMBULATORY_CARE_PROVIDER_SITE_OTHER): Payer: Self-pay | Admitting: Primary Care

## 2022-05-10 DIAGNOSIS — I10 Essential (primary) hypertension: Secondary | ICD-10-CM

## 2022-05-10 DIAGNOSIS — Z760 Encounter for issue of repeat prescription: Secondary | ICD-10-CM

## 2022-05-10 NOTE — Telephone Encounter (Signed)
Requested medication (s) are due for refill today:   Yes  Requested medication (s) are on the active medication list:   Yes  Future visit scheduled:   Yes 06/15/2022   Last ordered: 03/28/2021 #90, 3 refills  Returned because labs are due per protocol.   Provider to review for refills prior to upcoming appt.     Requested Prescriptions  Pending Prescriptions Disp Refills   losartan-hydrochlorothiazide (HYZAAR) 100-25 MG tablet [Pharmacy Med Name: LOSARTAN/HCTZ 100/25MG  TABLETS] 90 tablet 3    Sig: TAKE 1 TABLET BY MOUTH EVERY DAY     Cardiovascular: ARB + Diuretic Combos Failed - 05/10/2022  6:56 AM      Failed - K in normal range and within 180 days    Potassium  Date Value Ref Range Status  02/01/2021 3.6 3.5 - 5.2 mmol/L Final         Failed - Na in normal range and within 180 days    Sodium  Date Value Ref Range Status  02/01/2021 141 134 - 144 mmol/L Final         Failed - Cr in normal range and within 180 days    Creatinine, Ser  Date Value Ref Range Status  02/01/2021 0.76 0.57 - 1.00 mg/dL Final         Failed - eGFR is 10 or above and within 180 days    GFR calc Af Amer  Date Value Ref Range Status  02/04/2020 122 >59 mL/min/1.73 Final    Comment:    **In accordance with recommendations from the NKF-ASN Task force,**   [facility] is in the process of updating its eGFR calculation to the   2021 CKD-EPI creatinine equation that estimates kidney function   without a race variable.    GFR, Estimated  Date Value Ref Range Status  04/14/2020 >60 >60 mL/min Final    Comment:    (NOTE) Calculated using the CKD-EPI Creatinine Equation (2021)    eGFR  Date Value Ref Range Status  02/01/2021 97 >59 mL/min/1.73 Final         Passed - Patient is not pregnant      Passed - Last BP in normal range    BP Readings from Last 1 Encounters:  12/15/21 102/64         Passed - Valid encounter within last 6 months    Recent Outpatient Visits           4 months ago  Chronic bilateral low back pain, unspecified whether sciatica present   [place] [facility] Grayce Sessions, NP   8 months ago Essential hypertension   [place] [facility] Grayce Sessions, NP   9 months ago Need for diphtheria-tetanus-pertussis (Tdap) vaccine   [place] [facility] Grayce Sessions, NP   1 year ago Cervical cancer screening   [place] [facility] Grayce Sessions, NP   1 year ago Pain of both shoulder joints   [place] [facility] Grayce Sessions, NP       Future Appointments             In 1 month Randa Evens, Kinnie Scales, NP  [facility]

## 2022-05-15 NOTE — Telephone Encounter (Signed)
Will forward to provider  

## 2022-05-17 ENCOUNTER — Other Ambulatory Visit (INDEPENDENT_AMBULATORY_CARE_PROVIDER_SITE_OTHER): Payer: Self-pay | Admitting: Primary Care

## 2022-05-17 DIAGNOSIS — Z760 Encounter for issue of repeat prescription: Secondary | ICD-10-CM

## 2022-05-17 DIAGNOSIS — I10 Essential (primary) hypertension: Secondary | ICD-10-CM

## 2022-05-17 NOTE — Telephone Encounter (Signed)
Requested Prescriptions  Pending Prescriptions Disp Refills   losartan-hydrochlorothiazide (HYZAAR) 100-25 MG tablet [Pharmacy Med Name: LOSARTAN/HCTZ 100/25MG  TABLETS] 90 tablet     Sig: TAKE 1 TABLET BY MOUTH EVERY DAY     Cardiovascular: ARB + Diuretic Combos Failed - 05/17/2022  3:57 PM      Failed - K in normal range and within 180 days    Potassium  Date Value Ref Range Status  02/01/2021 3.6 3.5 - 5.2 mmol/L Final         Failed - Na in normal range and within 180 days    Sodium  Date Value Ref Range Status  02/01/2021 141 134 - 144 mmol/L Final         Failed - Cr in normal range and within 180 days    Creatinine, Ser  Date Value Ref Range Status  02/01/2021 0.76 0.57 - 1.00 mg/dL Final         Failed - eGFR is 10 or above and within 180 days    GFR calc Af Amer  Date Value Ref Range Status  02/04/2020 122 >59 mL/min/1.73 Final    Comment:    **In accordance with recommendations from the NKF-ASN Task force,**   [facility] is in the process of updating its eGFR calculation to the   2021 CKD-EPI creatinine equation that estimates kidney function   without a race variable.    GFR, Estimated  Date Value Ref Range Status  04/14/2020 >60 >60 mL/min Final    Comment:    (NOTE) Calculated using the CKD-EPI Creatinine Equation (2021)    eGFR  Date Value Ref Range Status  02/01/2021 97 >59 mL/min/1.73 Final         Passed - Patient is not pregnant      Passed - Last BP in normal range    BP Readings from Last 1 Encounters:  12/15/21 102/64         Passed - Valid encounter within last 6 months    Recent Outpatient Visits           5 months ago Chronic bilateral low back pain, unspecified whether sciatica present   [place] [facility] Grayce Sessions, NP   8 months ago Essential hypertension   [place] [facility] Grayce Sessions, NP   9 months ago Need for diphtheria-tetanus-pertussis (Tdap) vaccine   [place]  [facility] Grayce Sessions, NP   1 year ago Cervical cancer screening   [place] [facility] Grayce Sessions, NP   1 year ago Pain of both shoulder joints   [place] [facility] Grayce Sessions, NP       Future Appointments             In 4 weeks Randa Evens, Kinnie Scales, NP [place] [facility]

## 2022-06-15 ENCOUNTER — Ambulatory Visit (INDEPENDENT_AMBULATORY_CARE_PROVIDER_SITE_OTHER): Payer: Medicaid Other | Admitting: Primary Care

## 2022-06-26 ENCOUNTER — Ambulatory Visit (INDEPENDENT_AMBULATORY_CARE_PROVIDER_SITE_OTHER): Payer: Medicaid Other | Admitting: Primary Care

## 2022-06-26 ENCOUNTER — Encounter (INDEPENDENT_AMBULATORY_CARE_PROVIDER_SITE_OTHER): Payer: Self-pay | Admitting: Primary Care

## 2022-06-26 DIAGNOSIS — I10 Essential (primary) hypertension: Secondary | ICD-10-CM | POA: Diagnosis not present

## 2022-06-26 DIAGNOSIS — Z760 Encounter for issue of repeat prescription: Secondary | ICD-10-CM | POA: Diagnosis not present

## 2022-06-26 MED ORDER — AMLODIPINE BESYLATE 10 MG PO TABS: 10.0000 mg | ORAL_TABLET | Freq: Every day | ORAL | 1 refills | Status: AC

## 2022-06-26 MED ORDER — LOSARTAN POTASSIUM-HCTZ 100-25 MG PO TABS: 1.0000 | ORAL_TABLET | Freq: Every day | ORAL | 1 refills | Status: DC
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
## 2022-09-17 ENCOUNTER — Other Ambulatory Visit (INDEPENDENT_AMBULATORY_CARE_PROVIDER_SITE_OTHER): Payer: Self-pay | Admitting: Primary Care

## 2022-09-17 NOTE — Telephone Encounter (Signed)
Will forward to provider  

## 2022-12-14 ENCOUNTER — Other Ambulatory Visit (INDEPENDENT_AMBULATORY_CARE_PROVIDER_SITE_OTHER): Payer: Self-pay | Admitting: Primary Care

## 2022-12-14 DIAGNOSIS — I10 Essential (primary) hypertension: Secondary | ICD-10-CM

## 2022-12-14 DIAGNOSIS — Z760 Encounter for issue of repeat prescription: Secondary | ICD-10-CM

## 2022-12-14 NOTE — Telephone Encounter (Signed)
Requested Prescriptions  Pending Prescriptions Disp Refills   losartan-hydrochlorothiazide (HYZAAR) 100-25 MG tablet [Pharmacy Med Name: LOSARTAN/HCTZ 100/25MG  TABLETS] 90 tablet 1    Sig: TAKE 1 TABLET BY MOUTH DAILY     Cardiovascular: ARB + Diuretic Combos Failed - 12/14/2022 10:23 AM      Failed - K in normal range and within 180 days    Potassium  Date Value Ref Range Status  02/01/2021 3.6 3.5 - 5.2 mmol/L Final         Failed - Na in normal range and within 180 days    Sodium  Date Value Ref Range Status  02/01/2021 141 134 - 144 mmol/L Final         Failed - Cr in normal range and within 180 days    Creatinine, Ser  Date Value Ref Range Status  02/01/2021 0.76 0.57 - 1.00 mg/dL Final         Failed - eGFR is 10 or above and within 180 days    GFR calc Af Amer  Date Value Ref Range Status  02/04/2020 122 >59 mL/min/1.73 Final    Comment:    **In accordance with recommendations from the NKF-ASN Task force,**   [facility] is in the process of updating its eGFR calculation to the   2021 CKD-EPI creatinine equation that estimates kidney function   without a race variable.    GFR, Estimated  Date Value Ref Range Status  04/14/2020 >60 >60 mL/min Final    Comment:    (NOTE) Calculated using the CKD-EPI Creatinine Equation (2021)    eGFR  Date Value Ref Range Status  02/01/2021 97 >59 mL/min/1.73 Final         Passed - Patient is not pregnant      Passed - Last BP in normal range    BP Readings from Last 1 Encounters:  06/26/22 117/73         Passed - Valid encounter within last 6 months    Recent Outpatient Visits           5 months ago Medication refill   [place] [facility] Grayce Sessions, NP   12 months ago Chronic bilateral low back pain, unspecified whether sciatica present   [place] [facility] Grayce Sessions, NP   1 year ago Essential hypertension   [place] [facility] Grayce Sessions, NP   1 year ago Need for diphtheria-tetanus-pertussis (Tdap) vaccine   [place] [facility] Grayce Sessions, NP   1 year ago Cervical cancer screening   [place] [facility] Grayce Sessions, NP
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
## 2023-11-04 IMAGING — MG MM DIGITAL DIAGNOSTIC UNILAT*L* W/ TOMO W/ CAD
6 series · 6 of 18 positions shown · non-contrast
Comparison: Previous exam(s).

CLINICAL DATA: Screening recall for a left breast asymmetry.

EXAM:
DIGITAL DIAGNOSTIC UNILATERAL LEFT MAMMOGRAM WITH TOMOSYNTHESIS AND
CAD; ULTRASOUND LEFT BREAST LIMITED
TECHNIQUE: Left digital diagnostic mammography and breast tomosynthesis was
performed. The images were evaluated with computer-aided detection.;
Targeted ultrasound examination of the left breast was performed.

[L MLO synth-2D]
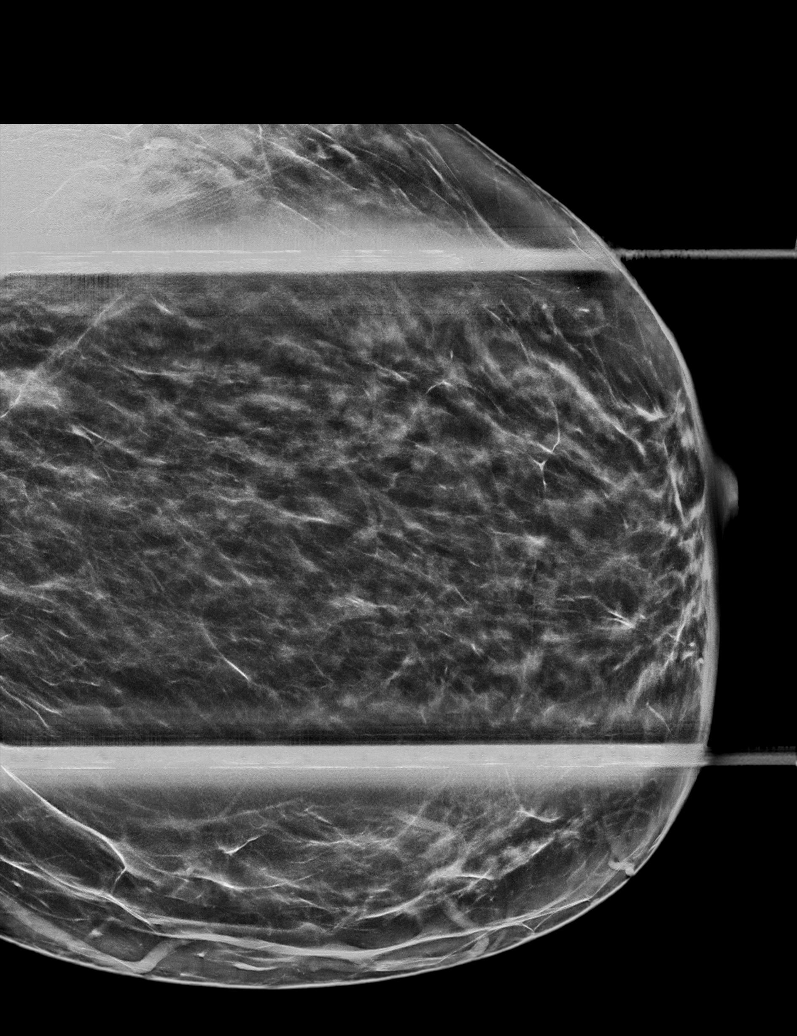

[L ML synth-2D]
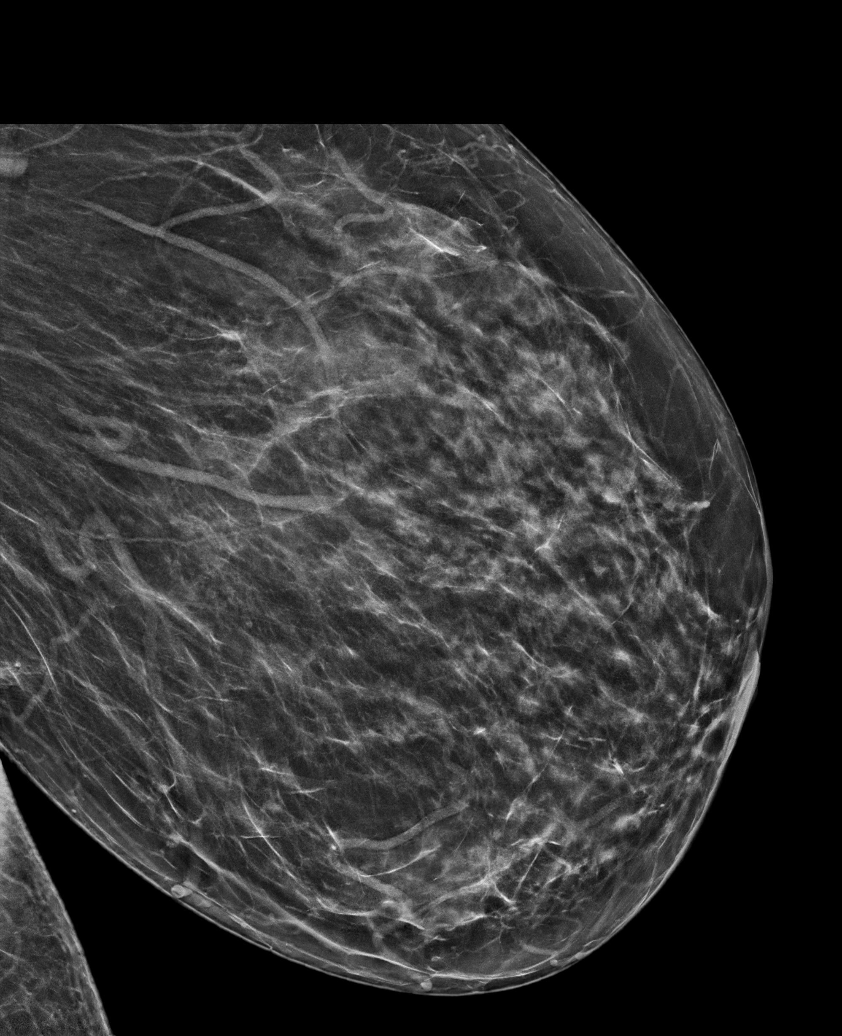

[L CC synth-2D]
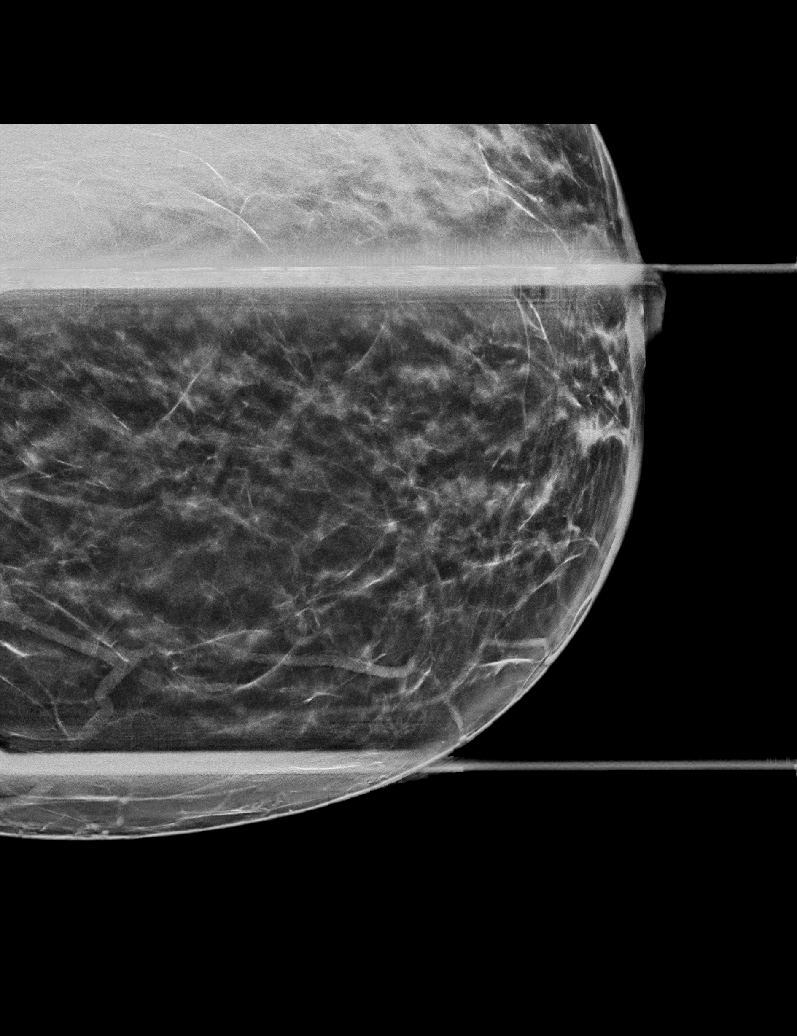

[L ML tomo · tomo slice 36/71.0]
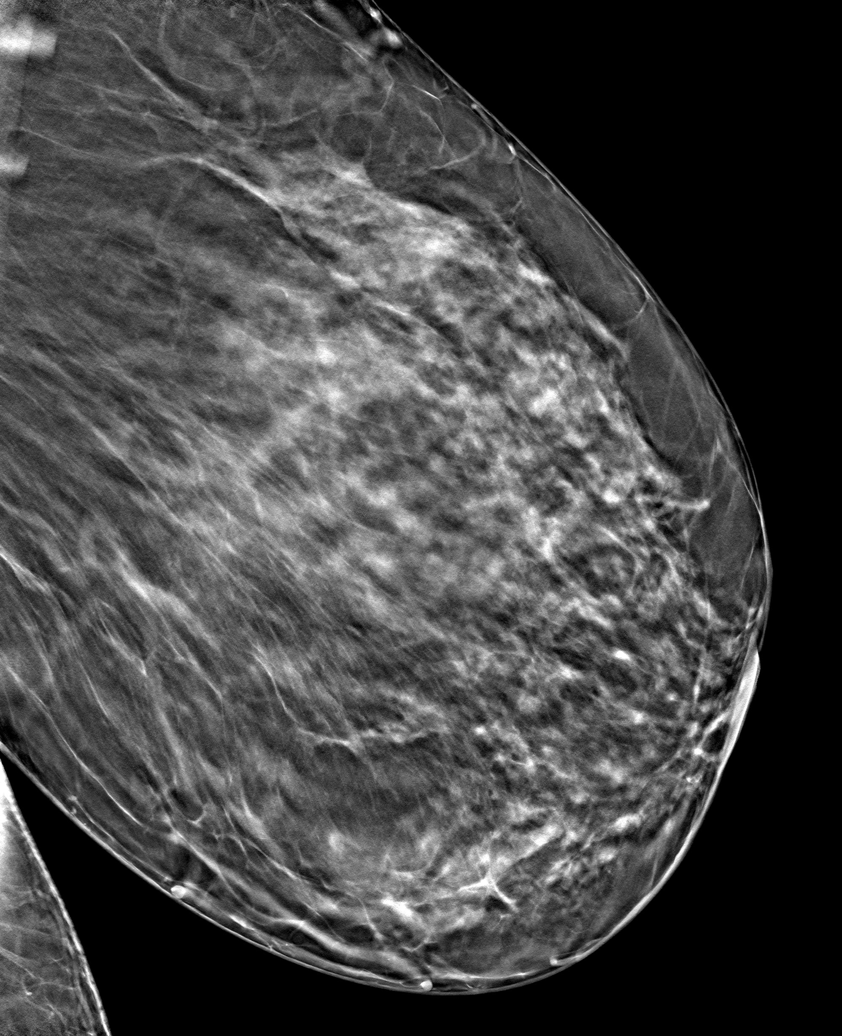

[L MLO tomo · tomo slice 37/73.0]
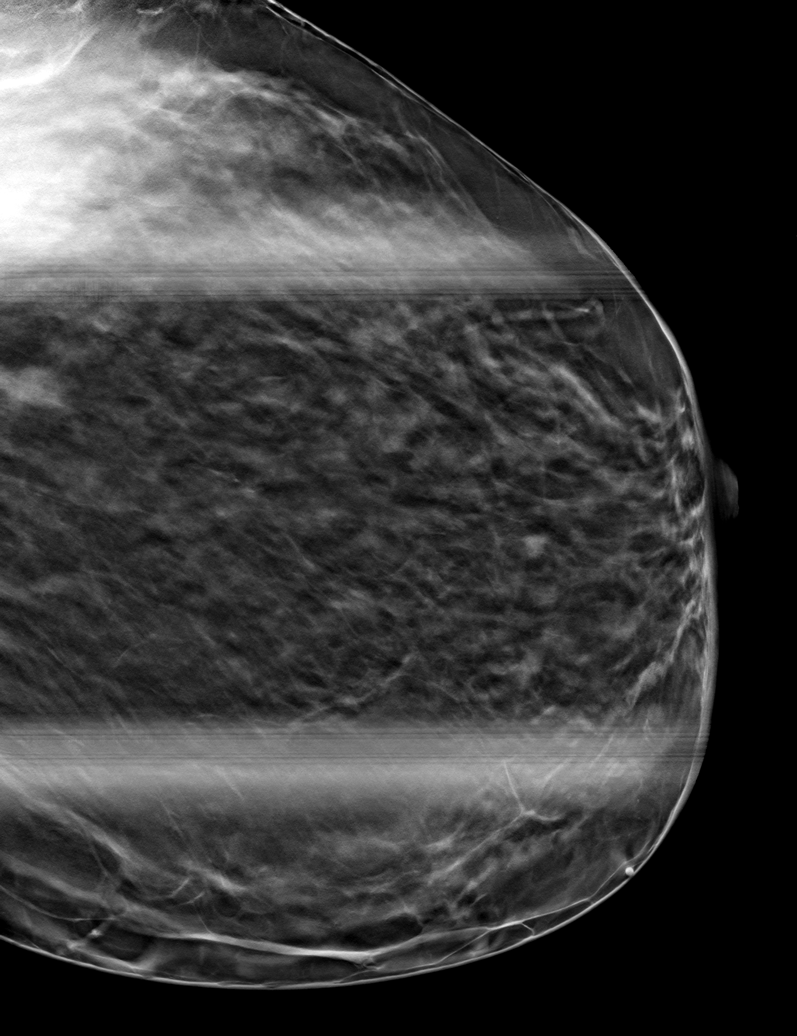

[L CC tomo · tomo slice 31/61.0]
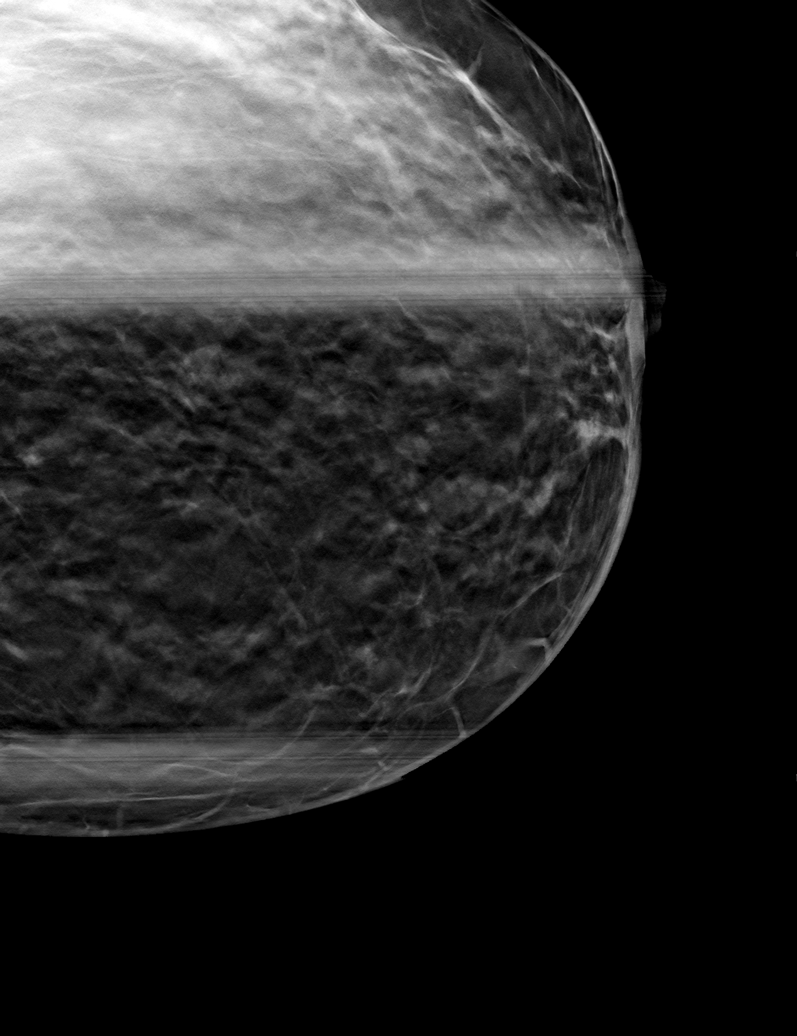

[6 of 18 positions shown; findings below may reference images not displayed]

ACR Breast Density Category c: The breast tissue is heterogeneously
dense, which may obscure small masses.
FINDINGS: Spot compression tomosynthesis images through the anterior left
breast demonstrates no definite persistent abnormalities. The
patient does have a slightly nodular appearance to her breast
tissue.

Ultrasound targeted to the anterior retroareolar to upper inner left
breast demonstrates normal fibroglandular tissue. No suspicious
masses or areas of shadowing are identified.
IMPRESSION: There are no persistent suspicious mammographic or targeted
sonographic findings in the left breast.

RECOMMENDATION:
Screening mammogram in one year.(Code:[id number])

I have discussed the findings and recommendations with the patient.
If applicable, a reminder letter will be sent to the patient
regarding the next appointment.

BI-RADS CATEGORY  1: Negative.
